# Patient Record
Sex: Female | Born: 1980 | Race: Black or African American | Hispanic: No | Marital: Single | Smoking: Never smoker
Health system: Southern US, Community
[De-identification: ages and names within clinical notes are randomized; demographics above are authoritative.]

## PROBLEM LIST (undated history)

## (undated) DIAGNOSIS — F419 Anxiety disorder, unspecified: Secondary | ICD-10-CM

## (undated) DIAGNOSIS — I10 Essential (primary) hypertension: Secondary | ICD-10-CM

## (undated) HISTORY — DX: Anxiety disorder, unspecified: F41.9

## (undated) HISTORY — DX: Essential (primary) hypertension: I10

## (undated) HISTORY — PX: TUBAL LIGATION: SHX77

## (undated) HISTORY — PX: CHOLECYSTECTOMY: SHX55

---

## 2018-02-25 DIAGNOSIS — M79652 Pain in left thigh: Secondary | ICD-10-CM | POA: Diagnosis not present

## 2018-02-25 DIAGNOSIS — M47816 Spondylosis without myelopathy or radiculopathy, lumbar region: Secondary | ICD-10-CM | POA: Diagnosis not present

## 2018-02-25 DIAGNOSIS — Z79899 Other long term (current) drug therapy: Secondary | ICD-10-CM | POA: Diagnosis not present

## 2018-02-25 DIAGNOSIS — M545 Low back pain: Secondary | ICD-10-CM | POA: Diagnosis not present

## 2018-02-25 DIAGNOSIS — M25552 Pain in left hip: Secondary | ICD-10-CM | POA: Diagnosis not present

## 2018-02-25 DIAGNOSIS — G5712 Meralgia paresthetica, left lower limb: Secondary | ICD-10-CM | POA: Diagnosis not present

## 2018-02-27 DIAGNOSIS — M545 Low back pain: Secondary | ICD-10-CM | POA: Diagnosis not present

## 2018-02-27 DIAGNOSIS — D1724 Benign lipomatous neoplasm of skin and subcutaneous tissue of left leg: Secondary | ICD-10-CM | POA: Diagnosis not present

## 2018-04-16 DIAGNOSIS — N898 Other specified noninflammatory disorders of vagina: Secondary | ICD-10-CM | POA: Diagnosis not present

## 2018-04-16 DIAGNOSIS — R350 Frequency of micturition: Secondary | ICD-10-CM | POA: Diagnosis not present

## 2018-04-16 DIAGNOSIS — R319 Hematuria, unspecified: Secondary | ICD-10-CM | POA: Diagnosis not present

## 2018-04-16 DIAGNOSIS — Z6834 Body mass index (BMI) 34.0-34.9, adult: Secondary | ICD-10-CM | POA: Diagnosis not present

## 2018-04-16 DIAGNOSIS — N926 Irregular menstruation, unspecified: Secondary | ICD-10-CM | POA: Diagnosis not present

## 2018-04-16 DIAGNOSIS — Z113 Encounter for screening for infections with a predominantly sexual mode of transmission: Secondary | ICD-10-CM | POA: Diagnosis not present

## 2018-04-21 DIAGNOSIS — N926 Irregular menstruation, unspecified: Secondary | ICD-10-CM | POA: Diagnosis not present

## 2018-05-07 DIAGNOSIS — N62 Hypertrophy of breast: Secondary | ICD-10-CM | POA: Diagnosis not present

## 2018-05-07 DIAGNOSIS — R319 Hematuria, unspecified: Secondary | ICD-10-CM | POA: Diagnosis not present

## 2018-05-07 DIAGNOSIS — Z6834 Body mass index (BMI) 34.0-34.9, adult: Secondary | ICD-10-CM | POA: Diagnosis not present

## 2018-05-07 DIAGNOSIS — R350 Frequency of micturition: Secondary | ICD-10-CM | POA: Diagnosis not present

## 2018-05-07 DIAGNOSIS — N926 Irregular menstruation, unspecified: Secondary | ICD-10-CM | POA: Diagnosis not present

## 2018-05-07 DIAGNOSIS — N3281 Overactive bladder: Secondary | ICD-10-CM | POA: Diagnosis not present

## 2018-05-08 DIAGNOSIS — M549 Dorsalgia, unspecified: Secondary | ICD-10-CM | POA: Diagnosis not present

## 2018-05-08 DIAGNOSIS — F329 Major depressive disorder, single episode, unspecified: Secondary | ICD-10-CM | POA: Diagnosis not present

## 2018-05-08 DIAGNOSIS — M6282 Rhabdomyolysis: Secondary | ICD-10-CM | POA: Diagnosis not present

## 2018-05-08 DIAGNOSIS — R109 Unspecified abdominal pain: Secondary | ICD-10-CM | POA: Diagnosis not present

## 2018-06-17 DIAGNOSIS — N926 Irregular menstruation, unspecified: Secondary | ICD-10-CM | POA: Diagnosis not present

## 2018-06-17 DIAGNOSIS — B9689 Other specified bacterial agents as the cause of diseases classified elsewhere: Secondary | ICD-10-CM | POA: Diagnosis not present

## 2018-06-17 DIAGNOSIS — N760 Acute vaginitis: Secondary | ICD-10-CM | POA: Diagnosis not present

## 2018-06-17 DIAGNOSIS — Z6833 Body mass index (BMI) 33.0-33.9, adult: Secondary | ICD-10-CM | POA: Diagnosis not present

## 2018-06-17 DIAGNOSIS — N3281 Overactive bladder: Secondary | ICD-10-CM | POA: Diagnosis not present

## 2018-11-10 DIAGNOSIS — R102 Pelvic and perineal pain: Secondary | ICD-10-CM | POA: Diagnosis not present

## 2018-11-10 DIAGNOSIS — Z6833 Body mass index (BMI) 33.0-33.9, adult: Secondary | ICD-10-CM | POA: Diagnosis not present

## 2018-11-10 DIAGNOSIS — R309 Painful micturition, unspecified: Secondary | ICD-10-CM | POA: Diagnosis not present

## 2018-11-14 DIAGNOSIS — D259 Leiomyoma of uterus, unspecified: Secondary | ICD-10-CM | POA: Diagnosis not present

## 2018-12-02 DIAGNOSIS — Z6834 Body mass index (BMI) 34.0-34.9, adult: Secondary | ICD-10-CM | POA: Diagnosis not present

## 2018-12-02 DIAGNOSIS — R102 Pelvic and perineal pain: Secondary | ICD-10-CM | POA: Diagnosis not present

## 2019-02-09 DIAGNOSIS — R895 Abnormal microbiological findings in specimens from other organs, systems and tissues: Secondary | ICD-10-CM | POA: Diagnosis not present

## 2019-02-09 DIAGNOSIS — N9489 Other specified conditions associated with female genital organs and menstrual cycle: Secondary | ICD-10-CM | POA: Diagnosis not present

## 2019-02-09 DIAGNOSIS — Z6834 Body mass index (BMI) 34.0-34.9, adult: Secondary | ICD-10-CM | POA: Diagnosis not present

## 2019-03-11 DIAGNOSIS — Z0000 Encounter for general adult medical examination without abnormal findings: Secondary | ICD-10-CM | POA: Diagnosis not present

## 2019-03-11 DIAGNOSIS — Z1151 Encounter for screening for human papillomavirus (HPV): Secondary | ICD-10-CM | POA: Diagnosis not present

## 2019-03-11 DIAGNOSIS — Z113 Encounter for screening for infections with a predominantly sexual mode of transmission: Secondary | ICD-10-CM | POA: Diagnosis not present

## 2019-03-16 DIAGNOSIS — M549 Dorsalgia, unspecified: Secondary | ICD-10-CM | POA: Diagnosis not present

## 2019-03-16 DIAGNOSIS — M47897 Other spondylosis, lumbosacral region: Secondary | ICD-10-CM | POA: Diagnosis not present

## 2019-03-16 DIAGNOSIS — M545 Low back pain: Secondary | ICD-10-CM | POA: Diagnosis not present

## 2019-05-04 DIAGNOSIS — M489 Spondylopathy, unspecified: Secondary | ICD-10-CM | POA: Diagnosis not present

## 2019-05-04 DIAGNOSIS — M5416 Radiculopathy, lumbar region: Secondary | ICD-10-CM | POA: Diagnosis not present

## 2019-05-04 DIAGNOSIS — Z6835 Body mass index (BMI) 35.0-35.9, adult: Secondary | ICD-10-CM | POA: Diagnosis not present

## 2019-05-04 DIAGNOSIS — M549 Dorsalgia, unspecified: Secondary | ICD-10-CM | POA: Diagnosis not present

## 2019-05-14 DIAGNOSIS — Z1389 Encounter for screening for other disorder: Secondary | ICD-10-CM | POA: Diagnosis not present

## 2019-05-14 DIAGNOSIS — G894 Chronic pain syndrome: Secondary | ICD-10-CM | POA: Diagnosis not present

## 2019-05-14 DIAGNOSIS — M545 Low back pain: Secondary | ICD-10-CM | POA: Diagnosis not present

## 2019-05-20 DIAGNOSIS — M519 Unspecified thoracic, thoracolumbar and lumbosacral intervertebral disc disorder: Secondary | ICD-10-CM | POA: Diagnosis not present

## 2019-05-20 DIAGNOSIS — M545 Low back pain: Secondary | ICD-10-CM | POA: Diagnosis not present

## 2019-06-18 DIAGNOSIS — G894 Chronic pain syndrome: Secondary | ICD-10-CM | POA: Diagnosis not present

## 2019-06-18 DIAGNOSIS — M545 Low back pain: Secondary | ICD-10-CM | POA: Diagnosis not present

## 2019-07-22 DIAGNOSIS — M545 Low back pain: Secondary | ICD-10-CM | POA: Diagnosis not present

## 2019-07-22 DIAGNOSIS — G894 Chronic pain syndrome: Secondary | ICD-10-CM | POA: Diagnosis not present

## 2019-07-23 DIAGNOSIS — M489 Spondylopathy, unspecified: Secondary | ICD-10-CM | POA: Diagnosis not present

## 2019-07-23 DIAGNOSIS — F329 Major depressive disorder, single episode, unspecified: Secondary | ICD-10-CM | POA: Diagnosis not present

## 2019-07-23 DIAGNOSIS — N6489 Other specified disorders of breast: Secondary | ICD-10-CM | POA: Diagnosis not present

## 2019-07-23 DIAGNOSIS — J309 Allergic rhinitis, unspecified: Secondary | ICD-10-CM | POA: Diagnosis not present

## 2019-11-10 DIAGNOSIS — M5489 Other dorsalgia: Secondary | ICD-10-CM | POA: Diagnosis not present

## 2019-11-10 DIAGNOSIS — N62 Hypertrophy of breast: Secondary | ICD-10-CM | POA: Diagnosis not present

## 2020-01-02 DIAGNOSIS — R109 Unspecified abdominal pain: Secondary | ICD-10-CM | POA: Diagnosis not present

## 2020-01-02 DIAGNOSIS — M5442 Lumbago with sciatica, left side: Secondary | ICD-10-CM | POA: Diagnosis not present

## 2020-01-02 DIAGNOSIS — R319 Hematuria, unspecified: Secondary | ICD-10-CM | POA: Diagnosis not present

## 2020-01-02 DIAGNOSIS — G8929 Other chronic pain: Secondary | ICD-10-CM | POA: Diagnosis not present

## 2020-03-23 DIAGNOSIS — Z202 Contact with and (suspected) exposure to infections with a predominantly sexual mode of transmission: Secondary | ICD-10-CM | POA: Diagnosis not present

## 2020-03-23 DIAGNOSIS — Z6834 Body mass index (BMI) 34.0-34.9, adult: Secondary | ICD-10-CM | POA: Diagnosis not present

## 2020-03-23 DIAGNOSIS — Z0000 Encounter for general adult medical examination without abnormal findings: Secondary | ICD-10-CM | POA: Diagnosis not present

## 2020-03-23 DIAGNOSIS — Z1151 Encounter for screening for human papillomavirus (HPV): Secondary | ICD-10-CM | POA: Diagnosis not present

## 2020-03-29 DIAGNOSIS — Z1231 Encounter for screening mammogram for malignant neoplasm of breast: Secondary | ICD-10-CM | POA: Diagnosis not present

## 2020-04-05 DIAGNOSIS — Z803 Family history of malignant neoplasm of breast: Secondary | ICD-10-CM | POA: Diagnosis not present

## 2020-04-05 DIAGNOSIS — N6321 Unspecified lump in the left breast, upper outer quadrant: Secondary | ICD-10-CM | POA: Diagnosis not present

## 2020-04-27 DIAGNOSIS — R928 Other abnormal and inconclusive findings on diagnostic imaging of breast: Secondary | ICD-10-CM | POA: Diagnosis not present

## 2020-04-28 DIAGNOSIS — R928 Other abnormal and inconclusive findings on diagnostic imaging of breast: Secondary | ICD-10-CM | POA: Diagnosis not present

## 2020-05-02 DIAGNOSIS — Z6836 Body mass index (BMI) 36.0-36.9, adult: Secondary | ICD-10-CM | POA: Diagnosis not present

## 2020-05-02 DIAGNOSIS — N941 Unspecified dyspareunia: Secondary | ICD-10-CM | POA: Diagnosis not present

## 2020-05-23 DIAGNOSIS — M7918 Myalgia, other site: Secondary | ICD-10-CM | POA: Diagnosis not present

## 2020-05-23 DIAGNOSIS — Z5181 Encounter for therapeutic drug level monitoring: Secondary | ICD-10-CM | POA: Diagnosis not present

## 2020-05-23 DIAGNOSIS — M461 Sacroiliitis, not elsewhere classified: Secondary | ICD-10-CM | POA: Diagnosis not present

## 2020-05-23 DIAGNOSIS — G8929 Other chronic pain: Secondary | ICD-10-CM | POA: Diagnosis not present

## 2020-05-23 DIAGNOSIS — M47816 Spondylosis without myelopathy or radiculopathy, lumbar region: Secondary | ICD-10-CM | POA: Diagnosis not present

## 2020-05-23 DIAGNOSIS — Z79891 Long term (current) use of opiate analgesic: Secondary | ICD-10-CM | POA: Diagnosis not present

## 2020-06-08 DIAGNOSIS — M47816 Spondylosis without myelopathy or radiculopathy, lumbar region: Secondary | ICD-10-CM | POA: Diagnosis not present

## 2020-06-08 DIAGNOSIS — M7918 Myalgia, other site: Secondary | ICD-10-CM | POA: Diagnosis not present

## 2020-06-08 DIAGNOSIS — G8929 Other chronic pain: Secondary | ICD-10-CM | POA: Diagnosis not present

## 2020-06-08 DIAGNOSIS — M461 Sacroiliitis, not elsewhere classified: Secondary | ICD-10-CM | POA: Diagnosis not present

## 2020-06-10 DIAGNOSIS — N200 Calculus of kidney: Secondary | ICD-10-CM | POA: Diagnosis not present

## 2020-06-10 DIAGNOSIS — Z6835 Body mass index (BMI) 35.0-35.9, adult: Secondary | ICD-10-CM | POA: Diagnosis not present

## 2020-06-10 DIAGNOSIS — E669 Obesity, unspecified: Secondary | ICD-10-CM | POA: Diagnosis not present

## 2020-06-10 DIAGNOSIS — R109 Unspecified abdominal pain: Secondary | ICD-10-CM | POA: Diagnosis not present

## 2020-06-16 DIAGNOSIS — Z20822 Contact with and (suspected) exposure to covid-19: Secondary | ICD-10-CM | POA: Diagnosis not present

## 2020-06-29 DIAGNOSIS — M47816 Spondylosis without myelopathy or radiculopathy, lumbar region: Secondary | ICD-10-CM | POA: Diagnosis not present

## 2020-06-29 DIAGNOSIS — G8929 Other chronic pain: Secondary | ICD-10-CM | POA: Diagnosis not present

## 2020-06-29 DIAGNOSIS — M461 Sacroiliitis, not elsewhere classified: Secondary | ICD-10-CM | POA: Diagnosis not present

## 2020-07-07 DIAGNOSIS — M461 Sacroiliitis, not elsewhere classified: Secondary | ICD-10-CM | POA: Diagnosis not present

## 2020-07-18 DIAGNOSIS — G8929 Other chronic pain: Secondary | ICD-10-CM | POA: Diagnosis not present

## 2020-07-18 DIAGNOSIS — M7918 Myalgia, other site: Secondary | ICD-10-CM | POA: Diagnosis not present

## 2020-07-18 DIAGNOSIS — M5416 Radiculopathy, lumbar region: Secondary | ICD-10-CM | POA: Diagnosis not present

## 2020-08-11 DIAGNOSIS — Z5181 Encounter for therapeutic drug level monitoring: Secondary | ICD-10-CM | POA: Diagnosis not present

## 2020-08-11 DIAGNOSIS — M7918 Myalgia, other site: Secondary | ICD-10-CM | POA: Diagnosis not present

## 2020-08-11 DIAGNOSIS — G8929 Other chronic pain: Secondary | ICD-10-CM | POA: Diagnosis not present

## 2020-08-11 DIAGNOSIS — M25561 Pain in right knee: Secondary | ICD-10-CM | POA: Diagnosis not present

## 2020-08-11 DIAGNOSIS — M47816 Spondylosis without myelopathy or radiculopathy, lumbar region: Secondary | ICD-10-CM | POA: Diagnosis not present

## 2020-08-11 DIAGNOSIS — Z79891 Long term (current) use of opiate analgesic: Secondary | ICD-10-CM | POA: Diagnosis not present

## 2020-08-11 DIAGNOSIS — M25562 Pain in left knee: Secondary | ICD-10-CM | POA: Diagnosis not present

## 2020-08-13 DIAGNOSIS — N938 Other specified abnormal uterine and vaginal bleeding: Secondary | ICD-10-CM | POA: Diagnosis not present

## 2020-08-13 DIAGNOSIS — R102 Pelvic and perineal pain: Secondary | ICD-10-CM | POA: Diagnosis not present

## 2020-08-13 DIAGNOSIS — N939 Abnormal uterine and vaginal bleeding, unspecified: Secondary | ICD-10-CM | POA: Diagnosis not present

## 2020-08-15 DIAGNOSIS — M170 Bilateral primary osteoarthritis of knee: Secondary | ICD-10-CM | POA: Diagnosis not present

## 2020-08-16 DIAGNOSIS — R319 Hematuria, unspecified: Secondary | ICD-10-CM | POA: Diagnosis not present

## 2020-08-16 DIAGNOSIS — Z6836 Body mass index (BMI) 36.0-36.9, adult: Secondary | ICD-10-CM | POA: Diagnosis not present

## 2020-08-16 DIAGNOSIS — N926 Irregular menstruation, unspecified: Secondary | ICD-10-CM | POA: Diagnosis not present

## 2020-08-16 DIAGNOSIS — R350 Frequency of micturition: Secondary | ICD-10-CM | POA: Diagnosis not present

## 2020-08-16 DIAGNOSIS — N898 Other specified noninflammatory disorders of vagina: Secondary | ICD-10-CM | POA: Diagnosis not present

## 2020-08-16 DIAGNOSIS — Z202 Contact with and (suspected) exposure to infections with a predominantly sexual mode of transmission: Secondary | ICD-10-CM | POA: Diagnosis not present

## 2020-08-31 DIAGNOSIS — Z6835 Body mass index (BMI) 35.0-35.9, adult: Secondary | ICD-10-CM | POA: Diagnosis not present

## 2020-08-31 DIAGNOSIS — N920 Excessive and frequent menstruation with regular cycle: Secondary | ICD-10-CM | POA: Diagnosis not present

## 2020-09-01 DIAGNOSIS — G8929 Other chronic pain: Secondary | ICD-10-CM | POA: Diagnosis not present

## 2020-09-01 DIAGNOSIS — M47816 Spondylosis without myelopathy or radiculopathy, lumbar region: Secondary | ICD-10-CM | POA: Diagnosis not present

## 2020-09-01 DIAGNOSIS — M170 Bilateral primary osteoarthritis of knee: Secondary | ICD-10-CM | POA: Diagnosis not present

## 2020-09-01 DIAGNOSIS — M791 Myalgia, unspecified site: Secondary | ICD-10-CM | POA: Diagnosis not present

## 2020-09-19 DIAGNOSIS — Z3202 Encounter for pregnancy test, result negative: Secondary | ICD-10-CM | POA: Diagnosis not present

## 2020-09-19 DIAGNOSIS — Z6835 Body mass index (BMI) 35.0-35.9, adult: Secondary | ICD-10-CM | POA: Diagnosis not present

## 2020-09-19 DIAGNOSIS — N920 Excessive and frequent menstruation with regular cycle: Secondary | ICD-10-CM | POA: Diagnosis not present

## 2020-09-21 DIAGNOSIS — M47816 Spondylosis without myelopathy or radiculopathy, lumbar region: Secondary | ICD-10-CM | POA: Diagnosis not present

## 2020-09-21 DIAGNOSIS — M170 Bilateral primary osteoarthritis of knee: Secondary | ICD-10-CM | POA: Diagnosis not present

## 2020-09-21 DIAGNOSIS — M542 Cervicalgia: Secondary | ICD-10-CM | POA: Diagnosis not present

## 2020-09-21 DIAGNOSIS — Z79891 Long term (current) use of opiate analgesic: Secondary | ICD-10-CM | POA: Diagnosis not present

## 2020-10-29 DIAGNOSIS — H9202 Otalgia, left ear: Secondary | ICD-10-CM | POA: Diagnosis not present

## 2020-10-29 DIAGNOSIS — Z9049 Acquired absence of other specified parts of digestive tract: Secondary | ICD-10-CM | POA: Diagnosis not present

## 2020-10-29 DIAGNOSIS — J069 Acute upper respiratory infection, unspecified: Secondary | ICD-10-CM | POA: Diagnosis not present

## 2020-10-29 DIAGNOSIS — K0889 Other specified disorders of teeth and supporting structures: Secondary | ICD-10-CM | POA: Diagnosis not present

## 2020-11-03 DIAGNOSIS — R928 Other abnormal and inconclusive findings on diagnostic imaging of breast: Secondary | ICD-10-CM | POA: Diagnosis not present

## 2020-12-09 ENCOUNTER — Other Ambulatory Visit: Payer: Self-pay

## 2020-12-09 DIAGNOSIS — Z5321 Procedure and treatment not carried out due to patient leaving prior to being seen by health care provider: Secondary | ICD-10-CM | POA: Insufficient documentation

## 2020-12-09 DIAGNOSIS — R109 Unspecified abdominal pain: Secondary | ICD-10-CM | POA: Insufficient documentation

## 2020-12-09 DIAGNOSIS — R110 Nausea: Secondary | ICD-10-CM | POA: Diagnosis not present

## 2020-12-09 DIAGNOSIS — M545 Low back pain, unspecified: Secondary | ICD-10-CM | POA: Diagnosis not present

## 2020-12-09 DIAGNOSIS — Z9049 Acquired absence of other specified parts of digestive tract: Secondary | ICD-10-CM | POA: Diagnosis not present

## 2020-12-09 DIAGNOSIS — R350 Frequency of micturition: Secondary | ICD-10-CM | POA: Insufficient documentation

## 2020-12-09 DIAGNOSIS — M5459 Other low back pain: Secondary | ICD-10-CM | POA: Diagnosis not present

## 2020-12-09 LAB — URINALYSIS, ROUTINE W REFLEX MICROSCOPIC
Bacteria, UA: NONE SEEN
Bilirubin Urine: NEGATIVE
Glucose, UA: NEGATIVE mg/dL
Ketones, ur: 5 mg/dL — AB
Leukocytes,Ua: NEGATIVE
Nitrite: NEGATIVE
Protein, ur: 30 mg/dL — AB
Specific Gravity, Urine: 1.031 — ABNORMAL HIGH (ref 1.005–1.030)
pH: 5 (ref 5.0–8.0)

## 2020-12-09 LAB — CBC
HCT: 37.2 % (ref 36.0–46.0)
Hemoglobin: 13.2 g/dL (ref 12.0–15.0)
MCH: 32.9 pg (ref 26.0–34.0)
MCHC: 35.5 g/dL (ref 30.0–36.0)
MCV: 92.8 fL (ref 80.0–100.0)
Platelets: 311 10*3/uL (ref 150–400)
RBC: 4.01 MIL/uL (ref 3.87–5.11)
RDW: 12.2 % (ref 11.5–15.5)
WBC: 8.4 10*3/uL (ref 4.0–10.5)
nRBC: 0 % (ref 0.0–0.2)

## 2020-12-09 LAB — BASIC METABOLIC PANEL
Anion gap: 7 (ref 5–15)
BUN: 12 mg/dL (ref 6–20)
CO2: 23 mmol/L (ref 22–32)
Calcium: 9.1 mg/dL (ref 8.9–10.3)
Chloride: 108 mmol/L (ref 98–111)
Creatinine, Ser: 0.71 mg/dL (ref 0.44–1.00)
GFR, Estimated: >60 mL/min (ref >60)
Glucose, Bld: 101 mg/dL — ABNORMAL HIGH (ref 70–99)
Potassium: 3.9 mmol/L (ref 3.5–5.1)
Sodium: 138 mmol/L (ref 135–145)

## 2020-12-09 NOTE — ED Triage Notes (Signed)
Pt ti ED for left sided flank pain started a few days ago with increased urinary frequency. +nausea NAD noted

## 2020-12-10 ENCOUNTER — Other Ambulatory Visit: Payer: Self-pay

## 2020-12-10 ENCOUNTER — Emergency Department: Payer: Medicaid Other

## 2020-12-10 ENCOUNTER — Emergency Department
Admission: EM | Admit: 2020-12-10 | Discharge: 2020-12-10 | Disposition: A | Discharge status: Home / Self Care | Payer: Medicaid Other | Source: Home / Self Care | Attending: Emergency Medicine | Admitting: Emergency Medicine

## 2020-12-10 ENCOUNTER — Emergency Department
Admission: EM | Admit: 2020-12-10 | Discharge: 2020-12-10 | Disposition: A | Discharge status: Home / Self Care | Payer: Medicaid Other | Attending: Emergency Medicine | Admitting: Emergency Medicine

## 2020-12-10 ENCOUNTER — Encounter: Payer: Self-pay | Admitting: Emergency Medicine

## 2020-12-10 DIAGNOSIS — R109 Unspecified abdominal pain: Secondary | ICD-10-CM | POA: Insufficient documentation

## 2020-12-10 DIAGNOSIS — M545 Low back pain, unspecified: Secondary | ICD-10-CM | POA: Insufficient documentation

## 2020-12-10 DIAGNOSIS — Z9049 Acquired absence of other specified parts of digestive tract: Secondary | ICD-10-CM | POA: Diagnosis not present

## 2020-12-10 DIAGNOSIS — M5459 Other low back pain: Secondary | ICD-10-CM | POA: Diagnosis not present

## 2020-12-10 LAB — URINALYSIS, ROUTINE W REFLEX MICROSCOPIC
Bacteria, UA: NONE SEEN
Bilirubin Urine: NEGATIVE
Glucose, UA: NEGATIVE mg/dL
Ketones, ur: NEGATIVE mg/dL
Leukocytes,Ua: NEGATIVE
Nitrite: NEGATIVE
Protein, ur: NEGATIVE mg/dL
Specific Gravity, Urine: 1.023 (ref 1.005–1.030)
pH: 5 (ref 5.0–8.0)

## 2020-12-10 MED ORDER — NAPROXEN 500 MG PO TABS: 500.0000 mg | ORAL_TABLET | Freq: Two times a day (BID) | ORAL | 2 refills | Status: DC

## 2020-12-10 NOTE — ED Triage Notes (Signed)
Pt reports was here last pm and left due to wait. Pt states pain to left lower back that radiates around left flank and to her left lower abd. Pt reports concern for kidney stone

## 2020-12-10 NOTE — ED Provider Notes (Signed)
[facility] Emergency Department Provider Note   ____________________________________________    I have reviewed the triage vital signs and the nursing notes.   HISTORY  Chief Complaint Abdominal Pain and Flank Pain     HPI Dawn Hunter is a 40 y.o. female who presents with complaints of left lower back pain and some flank pain which she reports started 2 days ago.  She reports it is more painful with movement.  She is concerned that she may have a kidney stone, no history of kidney stones, denies hematuria.  No fevers chills nausea or vomiting.  Does not take anything for this.  No injury to the back reported.  No loss of bowel or bladder continence, no numbness or weakness  History reviewed. No pertinent past medical history.  There are no problems to display for this patient.   History reviewed. No pertinent surgical history.  Prior to Admission medications   Medication Sig Start Date End Date Taking? Authorizing Provider  naproxen (NAPROSYN) 500 MG tablet Take 1 tablet (500 mg total) by mouth 2 (two) times daily with a meal. 12/10/20  Yes Lavonia Drafts, MD     Allergies Patient has no allergy information on record.  No family history on file.  Social History Social History   Substance Use Topics   Alcohol use: Not Currently   Drug use: Not Currently    Review of Systems  Constitutional: No fever/chills Eyes: No visual changes.  ENT: No sore throat. Cardiovascular: Denies chest pain. Respiratory: Denies shortness of breath. Gastrointestinal: As above Genitourinary: Negative for dysuria. Musculoskeletal: As above Skin: Negative for rash. Neurological: Negative for headaches or weakness   ____________________________________________   PHYSICAL EXAM:  VITAL SIGNS: ED Triage Vitals  Enc Vitals Group     BP 12/10/20 1306 127/82     Pulse Rate 12/10/20 1306 68     Resp 12/10/20 1306 18     Temp 12/10/20 1306 98 F  (36.7 C)     Temp Source 12/10/20 1306 Oral     SpO2 12/10/20 1306 97 %     Weight 12/10/20 1145 102.5 kg (226 lb)     Height 12/10/20 1145 1.702 m (5\' 7" )     Head Circumference --      Peak Flow --      Pain Score 12/10/20 1145 7     Pain Loc --      Pain Edu? --      Excl. in [place]? --     Constitutional: Alert and oriented.  Eyes: Conjunctivae are normal.   Nose: No congestion/rhinnorhea. Mouth/Throat: Mucous membranes are moist.    Cardiovascular: Normal rate, regular rhythm. Grossly normal heart sounds.  Good peripheral circulation. Respiratory: Normal respiratory effort.  No retractions. Lungs CTAB. Gastrointestinal: Soft and nontender. No distention.  No CVA tenderness Musculoskeletal: Mild left lumbar paraspinal tenderness to palpation, normal strength in the lower extremities, no saddle anesthesia Neurologic:  Normal speech and language. No gross focal neurologic deficits are appreciated.  Skin:  Skin is warm, dry and intact. No rash noted. Psychiatric: Mood and affect are normal. Speech and behavior are normal.  ____________________________________________   LABS (all labs ordered are listed, but only abnormal results are displayed)  Labs Reviewed  URINALYSIS, ROUTINE W REFLEX MICROSCOPIC - Abnormal; Notable for the following components:      Result Value   Color, Urine YELLOW (*)    APPearance HAZY (*)    Hgb urine dipstick SMALL (*)  All other components within normal limits   ____________________________________________  EKG  ____________________________________________  RADIOLOGY  CT renal stone study reviewed by me, no acute abnormality ____________________________________________   PROCEDURES  Procedure(s) performed: No  Procedures   Critical Care performed: No ____________________________________________   INITIAL IMPRESSION / ASSESSMENT AND PLAN / ED COURSE  Pertinent labs & imaging results that were available during my care of the  patient were reviewed by me and considered in my medical decision making (see chart for details).   Patient presents with left low back pain/flank pain as described above, overall I suspect musculoskeletal pain based on her exam however possibility of ureterolithiasis, will obtain urinalysis and CT renal stone study  CT scan is negative for ureterolithiasis or diverticulitis Urinalysis is reassuring, appropriate for discharge with NSAIDs and outpatient follow-up    ____________________________________________   FINAL CLINICAL IMPRESSION(S) / ED DIAGNOSES  Final diagnoses:  Acute low back pain without sciatica, unspecified back pain laterality        Note:  This document was prepared using Dragon voice recognition software and may include unintentional dictation errors.    Lavonia Drafts, MD 12/10/20 1525

## 2021-01-25 ENCOUNTER — Ambulatory Visit
Admission: EM | Admit: 2021-01-25 | Discharge: 2021-01-25 | Disposition: A | Discharge status: Other Acute Inpatient Hospital | Payer: Medicaid Other | Attending: Physician Assistant | Admitting: Physician Assistant

## 2021-01-25 ENCOUNTER — Other Ambulatory Visit: Payer: Self-pay

## 2021-01-25 ENCOUNTER — Emergency Department: Payer: Medicaid Other

## 2021-01-25 ENCOUNTER — Emergency Department
Admission: EM | Admit: 2021-01-25 | Discharge: 2021-01-25 | Disposition: A | Discharge status: Home / Self Care | Payer: Medicaid Other | Attending: Emergency Medicine | Admitting: Emergency Medicine

## 2021-01-25 ENCOUNTER — Encounter: Payer: Self-pay | Admitting: Emergency Medicine

## 2021-01-25 DIAGNOSIS — M542 Cervicalgia: Secondary | ICD-10-CM

## 2021-01-25 DIAGNOSIS — Z20822 Contact with and (suspected) exposure to covid-19: Secondary | ICD-10-CM | POA: Diagnosis not present

## 2021-01-25 DIAGNOSIS — R110 Nausea: Secondary | ICD-10-CM | POA: Diagnosis not present

## 2021-01-25 DIAGNOSIS — M79602 Pain in left arm: Secondary | ICD-10-CM

## 2021-01-25 DIAGNOSIS — R202 Paresthesia of skin: Secondary | ICD-10-CM | POA: Diagnosis not present

## 2021-01-25 DIAGNOSIS — R079 Chest pain, unspecified: Secondary | ICD-10-CM

## 2021-01-25 DIAGNOSIS — R0789 Other chest pain: Secondary | ICD-10-CM | POA: Insufficient documentation

## 2021-01-25 DIAGNOSIS — H9202 Otalgia, left ear: Secondary | ICD-10-CM | POA: Diagnosis not present

## 2021-01-25 DIAGNOSIS — R519 Headache, unspecified: Secondary | ICD-10-CM | POA: Diagnosis not present

## 2021-01-25 DIAGNOSIS — J3489 Other specified disorders of nose and nasal sinuses: Secondary | ICD-10-CM | POA: Diagnosis not present

## 2021-01-25 DIAGNOSIS — R42 Dizziness and giddiness: Secondary | ICD-10-CM | POA: Diagnosis not present

## 2021-01-25 LAB — CBC
HCT: 37.7 % (ref 36.0–46.0)
Hemoglobin: 13.2 g/dL (ref 12.0–15.0)
MCH: 32.7 pg (ref 26.0–34.0)
MCHC: 35 g/dL (ref 30.0–36.0)
MCV: 93.3 fL (ref 80.0–100.0)
Platelets: 299 10*3/uL (ref 150–400)
RBC: 4.04 MIL/uL (ref 3.87–5.11)
RDW: 12.7 % (ref 11.5–15.5)
WBC: 6.1 10*3/uL (ref 4.0–10.5)
nRBC: 0 % (ref 0.0–0.2)

## 2021-01-25 LAB — BASIC METABOLIC PANEL
Anion gap: 6 (ref 5–15)
BUN: 11 mg/dL (ref 6–20)
CO2: 23 mmol/L (ref 22–32)
Calcium: 8.7 mg/dL — ABNORMAL LOW (ref 8.9–10.3)
Chloride: 106 mmol/L (ref 98–111)
Creatinine, Ser: 0.7 mg/dL (ref 0.44–1.00)
GFR, Estimated: >60 mL/min (ref >60)
Glucose, Bld: 108 mg/dL — ABNORMAL HIGH (ref 70–99)
Potassium: 3.8 mmol/L (ref 3.5–5.1)
Sodium: 135 mmol/L (ref 135–145)

## 2021-01-25 LAB — RESP PANEL BY RT-PCR (FLU A&B, COVID) ARPGX2
Influenza A by PCR: NEGATIVE
Influenza B by PCR: NEGATIVE
SARS Coronavirus 2 by RT PCR: NEGATIVE

## 2021-01-25 LAB — TROPONIN I (HIGH SENSITIVITY)
Troponin I (High Sensitivity): 3 ng/L (ref <18)
Troponin I (High Sensitivity): 3 ng/L (ref <18)

## 2021-01-25 LAB — POC URINE PREG, ED: Preg Test, Ur: NEGATIVE

## 2021-01-25 MED ORDER — LORAZEPAM 2 MG/ML IJ SOLN
1.0000 mg | Freq: Once | INTRAMUSCULAR | Status: AC
  Administered 2021-01-25: 14:00:00 1 mg via INTRAVENOUS
  Filled 2021-01-25: qty 1

## 2021-01-25 MED ORDER — MAGNESIUM SULFATE 2 GM/50ML IV SOLN
2.0000 g | Freq: Once | INTRAVENOUS | Status: AC
  Administered 2021-01-25: 13:00:00 2 g via INTRAVENOUS
  Filled 2021-01-25: qty 50

## 2021-01-25 MED ORDER — ONDANSETRON 4 MG PO TBDP
4.0000 mg | ORAL_TABLET | Freq: Once | ORAL | Status: AC
  Administered 2021-01-25: 09:00:00 4 mg via ORAL

## 2021-01-25 MED ORDER — ASPIRIN 81 MG PO CHEW
324.0000 mg | CHEWABLE_TABLET | Freq: Once | ORAL | Status: AC
  Administered 2021-01-25: 09:00:00 324 mg via ORAL

## 2021-01-25 MED ORDER — DIPHENHYDRAMINE HCL 50 MG/ML IJ SOLN
25.0000 mg | Freq: Once | INTRAMUSCULAR | Status: AC
  Administered 2021-01-25: 13:00:00 25 mg via INTRAVENOUS
  Filled 2021-01-25: qty 1

## 2021-01-25 MED ORDER — LACTATED RINGERS IV BOLUS
1000.0000 mL | Freq: Once | INTRAVENOUS | Status: AC
  Administered 2021-01-25: 13:00:00 1000 mL via INTRAVENOUS

## 2021-01-25 MED ORDER — PROCHLORPERAZINE EDISYLATE 10 MG/2ML IJ SOLN
10.0000 mg | Freq: Once | INTRAMUSCULAR | Status: AC
  Administered 2021-01-25: 13:00:00 10 mg via INTRAVENOUS
  Filled 2021-01-25: qty 2

## 2021-01-25 NOTE — Discharge Instructions (Addendum)

## 2021-01-25 NOTE — ED Triage Notes (Signed)
Presents via EMS from [place] with h/a for the past 5 days   then noticed some left sided ches tpressure 1 day ago

## 2021-01-25 NOTE — ED Triage Notes (Signed)
Pt reports HA x 5 days, pressure and tingling in occipital region.  Reports L sided chest pressure and L arm pain x 1 day, dizziness starting this morning. No nausea.

## 2021-01-25 NOTE — ED Provider Notes (Signed)
[facility] Emergency Department Provider Note  ____________________________________________   Event Date/Time   First MD Initiated Contact with Patient 01/25/21 1247     (approximate)  I have reviewed the triage vital signs and the nursing notes.   HISTORY  Chief Complaint Chest Pain and Headache   HPI Dawn Hunter is a 40 y.o. female with below noted past medical history who presents for assessment of 5 days of headache associate with some left-sided chest pressure rating down the left arm that started yesterday.  Patient also states she developed some left ear pressure yesterday.  No right-sided ear pain, vision changes, vertigo, fevers, cough, shortness of breath, abdominal pain, back pain, rash or extremity pain.  No vomiting recent traumatic injuries or falls.  Patient denies EtOH use or illicit drug use.  She was seen in urgent care earlier today and referred to the emergency room with concerns for possible ACS.         History reviewed. No pertinent past medical history.  There are no problems to display for this patient.   Past Surgical History:  Procedure Laterality Date   CESAREAN SECTION     TUBAL LIGATION      Prior to Admission medications   Medication Sig Start Date End Date Taking? Authorizing Provider  naproxen (NAPROSYN) 500 MG tablet Take 1 tablet (500 mg total) by mouth 2 (two) times daily with a meal. Patient not taking: Reported on 01/25/2021 12/10/20   Lavonia Drafts, MD  pregabalin (LYRICA) 150 MG capsule Take 150 mg by mouth 2 (two) times daily. Patient not taking: Reported on 01/25/2021 09/01/20   [provider]    Allergies Patient has no known allergies.  No family history on file.  Social History Social History   Tobacco Use   Smoking status: Never   Smokeless tobacco: Never  Vaping Use   Vaping Use: Never used  Substance Use Topics   Alcohol use: Not Currently   Drug use: Not Currently     Review of Systems  Review of Systems  Constitutional:  Negative for chills and fever.  HENT:  Negative for sore throat.   Eyes:  Negative for pain.  Respiratory:  Negative for cough and stridor.   Cardiovascular:  Positive for chest pain.  Gastrointestinal:  Negative for vomiting.  Genitourinary:  Negative for dysuria.  Skin:  Negative for rash.  Neurological:  Positive for headaches. Negative for seizures and loss of consciousness.  Psychiatric/Behavioral:  Negative for suicidal ideas.   All other systems reviewed and are negative.    ____________________________________________   PHYSICAL EXAM:  VITAL SIGNS: ED Triage Vitals  Enc Vitals Group     BP 01/25/21 1024 127/83     Pulse Rate 01/25/21 1024 78     Resp 01/25/21 1024 18     Temp 01/25/21 1024 98 F (36.7 C)     Temp Source 01/25/21 1024 Oral     SpO2 01/25/21 1024 96 %     Weight 01/25/21 1019 225 lb 15.5 oz (102.5 kg)     Height 01/25/21 1019 5\' 7"  (1.702 m)     Head Circumference --      Peak Flow --      Pain Score 01/25/21 1019 6     Pain Loc --      Pain Edu? --      Excl. in [place]? --    Vitals:   01/25/21 1107 01/25/21 1330  BP: (!) 135/91 124/66  Pulse: 75 73  Resp: 18 18  Temp: 97.8 F (36.6 C)   SpO2: 95% 100%   Physical Exam Vitals and nursing note reviewed.  Constitutional:      General: She is not in acute distress.    Appearance: She is well-developed.  HENT:     Head: Normocephalic and atraumatic.     Right Ear: Tympanic membrane and external ear normal.     Left Ear: Tympanic membrane and external ear normal.  Eyes:     Conjunctiva/sclera: Conjunctivae normal.  Cardiovascular:     Rate and Rhythm: Normal rate and regular rhythm.     Heart sounds: No murmur heard. Pulmonary:     Effort: Pulmonary effort is normal. No respiratory distress.     Breath sounds: Normal breath sounds.  Abdominal:     Palpations: Abdomen is soft.     Tenderness: There is no abdominal tenderness.   Musculoskeletal:        General: No swelling.     Cervical back: Neck supple.  Skin:    General: Skin is warm and dry.     Capillary Refill: Capillary refill takes less than 2 seconds.  Neurological:     Mental Status: She is alert.  Psychiatric:        Mood and Affect: Mood normal.    Patient has full symmetric strength throughout the bilateral upper and lower extremities.  Sensations intact to light touch of all extremities.  No pronator drift.  No finger dysmetria.  There appears to be a possibly very subtle left-sided lower facial droop although this is no longer appreciated on several reassessments.  Patient does not appear meningitic and has large motion of her neck.  No overlying skin changes of the neck.  Some mild tenderness over bilateral trapezius muscles. ____________________________________________   LABS (all labs ordered are listed, but only abnormal results are displayed)  Labs Reviewed  BASIC METABOLIC PANEL - Abnormal; Notable for the following components:      Result Value   Glucose, Bld 108 (*)    Calcium 8.7 (*)    All other components within normal limits  RESP PANEL BY RT-PCR (FLU A&B, COVID) ARPGX2  CBC  POC URINE PREG, ED  TROPONIN I (HIGH SENSITIVITY)  TROPONIN I (HIGH SENSITIVITY)   ____________________________________________  EKG  ECG remarkable for sinus rhythm with a ventricular rate of 81, normal axis, unremarkable intervals without clear evidence of acute ischemia or significant arrhythmia. ____________________________________________  RADIOLOGY  ED MD interpretation: Chest x-ray shows no focal consolidation, effusion, edema, pneumothorax or other clear acute thoracic process.  CT head shows no evidence of intracranial hemorrhage, mass-effect, abscess, or other clear acute process but there is evidence of mild right and minimally left chronic maxillary sinusitis.  MRI brain shows no evidence of any focal lesions ischemia mass-effect or  other acute process.  There is evidence of some chronic very mild sinus disease.  Official radiology report(s): DG Chest 2 View  Result Date: 01/25/2021 CLINICAL DATA:  Chest pain EXAM: CHEST - 2 VIEW COMPARISON:  None. FINDINGS: The heart size and mediastinal contours are within normal limits. Shallow inspiration with low lung volumes. Both lungs are clear. No pleural effusion or pneumothorax. The visualized skeletal structures are unremarkable. IMPRESSION: No acute process in the chest. Electronically Signed   By: Macy Mis M.D.   On: 01/25/2021 10:58   CT Head Wo Contrast  Result Date: 01/25/2021 CLINICAL DATA:  Sudden onset severe headache with features of a  classic migraine. She has had a headache for 5 days with pressure tingling sensations in the occipital region. Dizziness began this morning. EXAM: CT HEAD WITHOUT CONTRAST TECHNIQUE: Contiguous axial images were obtained from the base of the skull through the vertex without intravenous contrast. COMPARISON:  None. FINDINGS: Brain: Normal appearing cerebral hemispheres and posterior fossa structures. Normal size and position of the ventricles. No intracranial hemorrhage, mass lesion or CT evidence of acute infarction. Vascular: No hyperdense vessel or unexpected calcification. Skull: Normal. Negative for fracture or focal lesion. Sinuses/Orbits: Mild right maxillary sinus mucosal thickening and minimal left maxillary sinus mucosal thickening. Unremarkable orbits. Other: None. IMPRESSION: 1. No intracranial abnormality. 2. 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 Electronically Signed   By: Claudie Revering M.D.   On: 01/25/2021 13:01   MR BRAIN WO CONTRAST  Result Date: 01/25/2021 CLINICAL DATA:  Neuro deficit, acute, stroke suspected. Headache for 5 days. New onset of left-sided chest pressure. EXAM: MRI HEAD WITHOUT CONTRAST TECHNIQUE: Multiplanar, multiecho pulse sequences of the brain and surrounding structures were  obtained without intravenous contrast. COMPARISON:  CT head without contrast 01/25/2021 FINDINGS: Brain: No acute infarct, hemorrhage, or mass lesion is present. No significant white matter lesions are present. The ventricles are of normal size. No significant extraaxial fluid collection is present. The internal auditory canals are within normal limits. The brainstem and cerebellum are within normal limits. Vascular: Flow is present in the major intracranial arteries. Skull and upper cervical spine: The craniocervical junction is normal. Upper cervical spine is within normal limits. Marrow signal is unremarkable. Sinuses/Orbits: Mild mucosal thickening is present in the maxillary sinuses bilaterally and scattered throughout the ethmoid air cells. No fluid levels are present. The paranasal sinuses and mastoid air cells are otherwise clear. The globes and orbits are within normal limits. IMPRESSION: 1. Normal MRI appearance of the brain. No acute or focal lesion to explain the patient's symptoms. 2. Mild diffuse sinus disease. Electronically Signed   By: San Morelle M.D.   On: 01/25/2021 14:51    ____________________________________________   PROCEDURES  Procedure(s) performed (including Critical Care):  Procedures   ____________________________________________   INITIAL IMPRESSION / ASSESSMENT AND PLAN / ED COURSE      Patient presents with above-stated exam for assessment of some left-sided chest discomfort rating down the left arm started yesterday as well as some left ear pressure in the setting of 5 days of headache.  Patient denies any fever shortness of breath, cough or other clear associated sick symptoms.  She was at urgent care earlier today who referred her to the emergency room with concerns of possible ACS.  On arrival here she is afebrile hemodynamically stable.  Differential considerations include acute viral URI, ACS, pneumothorax, pneumonia, PE, anemia, metabolic  derangements, tension headache, ECG remarkable for sinus rhythm with a ventricular rate of 81, normal axis, unremarkable intervals without clear evidence of acute ischemia or significant arrhythmia.  Patient denying any neck pain or shortness of breath to this examiner and states her chest discomfort is pressure-like and overall I have a low suspicion for dissection at this time.  Patient is PERC negative and have a low suspicion for PE.  She does not appear septic or meningitic and I do not see any evidence of deep space infection of the head or neck including evidence of otitis media on the left.  I suspect likely eustachian tube dysfunction.  EKG and nonelevated troponin x2 are not suggestive of ACS or myocarditis.  CBC  shows no leukocytosis or acute anemia.  BMP shows no significant electrolyte or metabolic derangements.  Pregnancy test is negative.  Chest x-ray shows no focal consolidation, effusion, edema, pneumothorax or other clear acute thoracic process.  BMP shows no significant electrolyte or metabolic derangements.  CBC shows no leukocytosis or acute anemia.  Pregnancy test is negative.  Overall unclear etiology for patient's symptoms.  And certainly possible she has an acute viral infection although she denies any congestion or fever or leukocytosis or findings on exam to suggest a bacterial sinusitis.  We will send a COVID influenza PCR which I advised patient she can follow-up online.  On my reassessment patient states she is feeling much better after below noted headache cocktail.  Her chest pressure is much better as well.  Given improvement in symptoms with otherwise stable vitals and reassuring exam work-up I think is stable for discharge with close outpatient follow-up.  Discharged in stable condition.  Strict return precautions advised and discussed.  ____________________________________________   FINAL CLINICAL IMPRESSION(S) / ED DIAGNOSES  Final diagnoses:  Chest pain,  unspecified type  Nonintractable headache, unspecified chronicity pattern, unspecified headache type    Medications  prochlorperazine (COMPAZINE) injection 10 mg (10 mg Intravenous Given 01/25/21 1318)  diphenhydrAMINE (BENADRYL) injection 25 mg (25 mg Intravenous Given 01/25/21 1318)  lactated ringers bolus 1,000 mL (1,000 mLs Intravenous New Bag/Given 01/25/21 1322)  magnesium sulfate IVPB 2 g 50 mL (0 g Intravenous Stopped 01/25/21 1424)  LORazepam (ATIVAN) injection 1 mg (1 mg Intravenous Given 01/25/21 1353)     ED Discharge Orders     None        Note:  This document was prepared using Dragon voice recognition software and may include unintentional dictation errors.    Lucrezia Starch, MD 01/25/21 [phone]

## 2021-01-25 NOTE — ED Provider Notes (Signed)
[facility]    CSN: [id number] Arrival date & time: 01/25/21  0840      History   Chief Complaint Chief Complaint  Patient presents with   Chest Pain    HPI Dawn Hunter is a 40 y.o. female presenting for left-sided chest pain and pressure with radiation to left arm for the past 6 to 8 hours.  Patient also reports feeling dizzy and nauseous.  No vomiting.  Patient states the chest pain "feels like something sitting on my chest."  Admits to a bit of left-sided neck pain and occipital headache for the past 5 days.  Has taken Tylenol for pain relief without any improvement in symptoms.  No visual disturbances, numbness/weakness.  No shortness of breath.  No palpitations.  Denies any similar problems in past.  Reports no history of heart attack or stroke in any family member under age 40.  Patient denies any medical problems.  No history of hypertension, hyperlipidemia, diabetes, blood clotting disorders.  Patient is otherwise healthy.  No regular medicines.  HPI  History reviewed. No pertinent past medical history.  There are no problems to display for this patient.   Past Surgical History:  Procedure Laterality Date   CESAREAN SECTION     TUBAL LIGATION      OB History   No obstetric history on file.      Home Medications    Prior to Admission medications   Medication Sig Start Date End Date Taking? Authorizing Provider  naproxen (NAPROSYN) 500 MG tablet Take 1 tablet (500 mg total) by mouth 2 (two) times daily with a meal. 12/10/20   Lavonia Drafts, MD    Family History No family history on file.  Social History Social History   Tobacco Use   Smoking status: Never   Smokeless tobacco: Never  Vaping Use   Vaping Use: Never used  Substance Use Topics   Alcohol use: Not Currently   Drug use: Not Currently     Allergies   Patient has no known allergies.   Review of Systems Review of Systems  Constitutional:  Positive for fatigue. Negative  for fever.  HENT:  Positive for congestion.   Eyes:  Negative for visual disturbance.  Respiratory:  Negative for cough and shortness of breath.   Cardiovascular:  Positive for chest pain. Negative for palpitations.  Gastrointestinal:  Positive for nausea. Negative for abdominal pain and vomiting.  Musculoskeletal:  Positive for neck pain and neck stiffness. Negative for back pain.  Neurological:  Positive for dizziness, light-headedness and headaches. Negative for syncope, facial asymmetry and numbness.  Psychiatric/Behavioral:  Negative for confusion.     Physical Exam Triage Vital Signs ED Triage Vitals [01/25/21 0859]  Enc Vitals Group     BP (!) 141/99     Pulse Rate 83     Resp 18     Temp 98.6 F (37 C)     Temp Source Oral     SpO2 98 %     Weight      Height      Head Circumference      Peak Flow      Pain Score 9     Pain Loc      Pain Edu?      Excl. in [place]?    No data found.  Updated Vital Signs BP (!) 141/99 (BP Location: Left Arm)    Pulse 83    Temp 98.6 F (37 C) (Oral)  Resp 18    SpO2 98%      Physical Exam Vitals and nursing note reviewed.  Constitutional:      General: She is in acute distress (appears uncomfortable and in pain, holding left arm).     Appearance: Normal appearance. She is not ill-appearing or toxic-appearing.  HENT:     Head: Normocephalic and atraumatic.     Nose: Nose normal.     Mouth/Throat:     Mouth: Mucous membranes are moist.     Pharynx: Oropharynx is clear.  Eyes:     General: No scleral icterus.       Right eye: No discharge.        Left eye: No discharge.     Extraocular Movements: Extraocular movements intact.     Conjunctiva/sclera: Conjunctivae normal.     Pupils: Pupils are equal, round, and reactive to light.     Comments: +photophobia  Cardiovascular:     Rate and Rhythm: Normal rate and regular rhythm.     Heart sounds: Normal heart sounds.  Pulmonary:     Effort: Pulmonary effort is normal. No  respiratory distress.     Breath sounds: Normal breath sounds. No wheezing, rhonchi or rales.  Musculoskeletal:     Cervical back: Normal range of motion and neck supple. Tenderness (TTP left paracervical muscles) present.  Skin:    General: Skin is dry.  Neurological:     General: No focal deficit present.     Mental Status: She is alert and oriented to person, place, and time. Mental status is at baseline.     Cranial Nerves: No cranial nerve deficit.     Motor: No weakness.     Coordination: Coordination normal.     Gait: Gait normal.     Comments: 5/5 strength bilat UE and LE  Psychiatric:        Mood and Affect: Mood normal.        Behavior: Behavior normal.        Thought Content: Thought content normal.     UC Treatments / Results  Labs (all labs ordered are listed, but only abnormal results are displayed) Labs Reviewed - No data to display  EKG   Radiology No results found.  Procedures ED EKG  Date/Time: 01/25/2021 9:24 AM Performed by: Danton Clap, PA-C Authorized by: Danton Clap, PA-C   Previous ECG:    Previous ECG:  Unavailable Interpretation:    Interpretation: normal   Rate:    ECG rate:  83   ECG rate assessment: normal   Rhythm:    Rhythm: sinus rhythm   Ectopy:    Ectopy: none   QRS:    QRS axis:  Normal   QRS intervals:  Normal   QRS conduction: normal   ST segments:    ST segments:  Normal T waves:    T waves: normal   Comments:     Normal sinus rhythm, regular rate (including critical care time)  Medications Ordered in UC Medications  ondansetron (ZOFRAN-ODT) disintegrating tablet 4 mg (4 mg Oral Given 01/25/21 0926)  aspirin chewable tablet 324 mg (324 mg Oral Given 01/25/21 0926)    Initial Impression / Assessment and Plan / UC Course  I have reviewed the triage vital signs and the nursing notes.  Pertinent labs & imaging results that were available during my care of the patient were reviewed by me and considered in  my medical decision making (see chart for details).  40 year old female with no medical problems presenting for left chest pain and left arm pain that started 6 to 8 hours ago.  With associated headache, dizziness, nausea.  Vitals are stable.  BP slightly elevated at 141/99.  Remainder of vital signs are normal.  Patient is in some distress.  Appears to be uncomfortable and in pain, clutching her left arm and chest at times.  Chest is clear to auscultation heart regular rate rhythm.  Normal cranial nerve exam and 5 out of 5 strength bilateral upper and lower extremities.  EKG performed today shows normal sinus rhythm and regular rate.  83 bpm.  No ST or T wave elevation or depression.  Patient's symptoms very concerning for acute MI.  Also discussed with patient the possibility of her pain could be due to tension headache, complicated migraine, herniated cervical disc, or other noncardiac condition.  Patient is reporting that her chest and arm hurt the most.  Patient drove herself here.  Advise she needs further evaluation in emergency department this time.  EMS contacted.  Patient given 4 mg ODT Zofran in clinic and 324 mg oral baby aspirin.  Nurse to start IV.  Report given to EMS.  Patient leaving in stable condition.  Final Clinical Impressions(s) / UC Diagnoses   Final diagnoses:  Chest pain, unspecified type  Left arm pain  Nausea without vomiting     Discharge Instructions      You have been advised to follow up immediately in the emergency department for concerning signs.symptoms. If you declined EMS transport, please have a family member take you directly to the ED at this time. Do not delay. Based on concerns about condition, if you do not follow up in th e ED, you may risk poor outcomes including worsening of condition, delayed treatment and potentially life threatening issues. If you have declined to go to the ED at this time, you should call your PCP immediately to set up a  follow up appointment.  Go to ED for red flag symptoms, including; fevers you cannot reduce with Tylenol/Motrin, severe headaches, vision changes, numbness/weakness in part of the body, lethargy, confusion, intractable vomiting, severe dehydration, chest pain, breathing difficulty, severe persistent abdominal or pelvic pain, signs of severe infection (increased redness, swelling of an area), feeling faint or passing out, dizziness, etc. You should especially go to the ED for sudden acute worsening of condition if you do not elect to go at this time.      ED Prescriptions   None    PDMP not reviewed this encounter.   Danton Clap, PA-C 01/25/21 1004

## 2021-01-25 NOTE — ED Notes (Signed)
Patient is being discharged from the Urgent Care and sent to the Emergency Department via EMS . Per Wolfgang Phoenix, patient is in need of higher level of care due to Chest pain. Patient is aware and verbalizes understanding of plan of care.  Vitals:   01/25/21 0859  BP: (!) 141/99  Pulse: 83  Resp: 18  Temp: 98.6 F (37 C)  SpO2: 98%

## 2021-02-24 ENCOUNTER — Ambulatory Visit: Payer: Self-pay | Admitting: *Deleted

## 2021-02-24 DIAGNOSIS — I10 Essential (primary) hypertension: Secondary | ICD-10-CM | POA: Diagnosis not present

## 2021-02-24 NOTE — Telephone Encounter (Signed)
Summary: Headache & dizzy sx   Pt is calling because she thinks she is having some blood pressure concerns. She does not have a cuff to take her bp. Pt reports feeling dizzy since yesterday about 11:30p with a headached. Pt was scheduled a new patient appt with [facility].        Chief Complaint: requesting appt  Symptoms: feels like BP elevated, dizziness, headache, nose bleeds, tired  Frequency: stated last night 1130 pm Pertinent Negatives: Patient denies chest pain, difficulty breathing, weakness on either side  Disposition: [] ED /[x] Urgent Care (no appt availability in office) / [] Appointment(In office/virtual)/ []  [facility]/ [] Home Care/ [] Refused Recommended Disposition /[] [facility]/ []  Follow-up with PCP Additional Notes:   Recommended UC and if sx worsen go to ED.       Reason for Disposition  Wants doctor to measure BP  Answer Assessment - Initial Assessment Questions 1. BLOOD PRESSURE: "What is the blood pressure?" "Did you take at least two measurements 5 minutes apart?"     Doesnot have BP monitor  2. ONSET: "When did you take your blood pressure?"     na 3. HOW: "How did you obtain the blood pressure?" (e.g., visiting nurse, automatic home BP monitor)     na 4. HISTORY: "Do you have a history of high blood pressure?"     Yes  5. MEDICATIONS: "Are you taking any medications for blood pressure?" "Have you missed any doses recently?"     no 6. OTHER SYMPTOMS: "Do you have any symptoms?" (e.g., headache, chest pain, blurred vision, difficulty breathing, weakness)     Headache, dizziness, feeling tired  7. PREGNANCY: "Is there any chance you are pregnant?" "When was your last menstrual period?"     na  Protocols used: Blood Pressure - High-A-AH

## 2021-03-15 ENCOUNTER — Ambulatory Visit: Payer: Medicaid Other | Admitting: Family Medicine

## 2021-04-06 ENCOUNTER — Ambulatory Visit: Payer: Medicaid Other | Admitting: Family Medicine

## 2021-05-04 ENCOUNTER — Ambulatory Visit
Admission: RE | Admit: 2021-05-04 | Discharge: 2021-05-04 | Disposition: A | Discharge status: Home / Self Care | Payer: Medicaid Other | Attending: Family Medicine | Admitting: Family Medicine

## 2021-05-04 ENCOUNTER — Encounter: Payer: Self-pay | Admitting: Family Medicine

## 2021-05-04 ENCOUNTER — Ambulatory Visit
Admission: RE | Admit: 2021-05-04 | Discharge: 2021-05-04 | Disposition: A | Discharge status: Home / Self Care | Payer: Medicaid Other | Source: Ambulatory Visit | Attending: Family Medicine | Admitting: Family Medicine

## 2021-05-04 ENCOUNTER — Ambulatory Visit: Payer: Medicaid Other | Admitting: Family Medicine

## 2021-05-04 VITALS — BP 140/90 | HR 88 | Ht 67.0 in | Wt 218.0 lb

## 2021-05-04 DIAGNOSIS — N62 Hypertrophy of breast: Secondary | ICD-10-CM | POA: Diagnosis not present

## 2021-05-04 DIAGNOSIS — M5417 Radiculopathy, lumbosacral region: Secondary | ICD-10-CM | POA: Diagnosis not present

## 2021-05-04 DIAGNOSIS — M47816 Spondylosis without myelopathy or radiculopathy, lumbar region: Secondary | ICD-10-CM | POA: Insufficient documentation

## 2021-05-04 DIAGNOSIS — I10 Essential (primary) hypertension: Secondary | ICD-10-CM | POA: Insufficient documentation

## 2021-05-04 DIAGNOSIS — M2578 Osteophyte, vertebrae: Secondary | ICD-10-CM | POA: Diagnosis not present

## 2021-05-04 DIAGNOSIS — M545 Low back pain, unspecified: Secondary | ICD-10-CM | POA: Diagnosis not present

## 2021-05-04 MED ORDER — CYCLOBENZAPRINE HCL 10 MG PO TABS: 10.0000 mg | ORAL_TABLET | Freq: Three times a day (TID) | ORAL | 0 refills | Status: DC | PRN

## 2021-05-04 MED ORDER — HYDROCHLOROTHIAZIDE 12.5 MG PO TABS: 12.5000 mg | ORAL_TABLET | Freq: Every day | ORAL | 0 refills | Status: DC

## 2021-05-04 NOTE — Assessment & Plan Note (Signed)
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  Evaluation and management have included prior imaging, physical therapy, medication management, spinal corticosteroid injections, and neural blocks.  Examination shows diffuse paraspinal lumbar tenderness, positive left-sided straight leg raise, equivocal FABER bilaterally, positive facet load/Kemps test.  At this stage, given her prior extensive work-up, will obtain outside medical records.  Plan for updated lumbar spine x-ray with order placed today.  Given comorbid hypertension, we will hold from NSAIDs, she can trial as needed cyclobenzaprine .

## 2021-05-04 NOTE — Assessment & Plan Note (Signed)
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  Evaluation and management have included prior imaging, physical therapy, medication management, spinal corticosteroid injections, and neural blocks. ? ?Examination shows diffuse paraspinal lumbar tenderness, positive left-sided straight leg raise, equivocal FABER bilaterally, positive facet load/Kemps test. ? ?At this stage, given her prior extensive work-up, will obtain outside medical records.  Plan for updated lumbar spine x-ray with order placed today.  Given comorbid hypertension, we will hold from NSAIDs, she can trial as needed cyclobenzaprine. ?

## 2021-05-04 NOTE — Assessment & Plan Note (Signed)
Chronic issue in the setting of elevated BMI, recent ER visits.  She denies any acute cardiopulmonary symptoms.  Her examination reveals positive S1 and S2, regular rate and rhythm, no additional heart sounds, no JVD, no carotid bruits, symmetric pulses in the extremities, no peripheral edema.  She has clear lung fields throughout without bibasilar rales. ? ?She is not on any current pharmacotherapy, will initiate hydrochlorothiazide 12.5 mg daily, close follow-up in 1 month we will recheck BP and determine the need for further titration.  That will be her annual physical where we will order multiple Risk stratification labs. ? ?Chronic issue, ongoing/uncontrolled, Rx management ?

## 2021-05-04 NOTE — Progress Notes (Signed)
?  ? ?Primary Care / Sports Medicine Office Visit ? ?Patient Information:  ?Patient ID: Dawn Hunter, female DOB: 17-Apr-1980 Age: 41 y.o. MRN: [id number]  ? ?Dawn Hunter is a pleasant 41 y.o. female presenting with the following: ? ?Chief Complaint  ?Patient presents with  ? Establish Care  ? Hypertension  ?  Never been on medication, been elevated for 2 years  ? Back Pain  ?  Started having pain in 2016, MRI in 2018. Pain management, steroid shot, acupuncture with no help.    ? ? ?Vitals:  ? 05/04/21 0853  ?BP: 140/90  ?Pulse: 88  ?SpO2: 98%  ? ?Vitals:  ? 05/04/21 0853  ?Weight: 218 lb (98.9 kg)  ?Height: '5\' 7"'$  (1.702 m)  ? ?Body mass index is 34.14 kg/m?. ? ?No results found.  ? ?Independent interpretation of notes and tests performed by another provider:  ? ?None ? ?Procedures performed:  ? ?None ? ?Pertinent History, Exam, Impression, and Recommendations:  ? ?Symptomatic mammary hypertrophy ?Patient presenting for establishment of care and brings up concern over symptomatic bilateral mammary hypertrophy with primarily musculoskeletal symptoms throughout the spine with additional focality at the lumbosacral region.  She had previously been preparing for breast reduction surgery but had to move to a different part of [place] and was unable to pursue surgery.  In regards to her symptoms, there are diffuse about the lumbosacral region, involves radiation to the left lower extremity, and has had multiple treatments inclusive of physical therapy, injections, pharmacotherapy.  We have requested outside medical records from her prior primary care provider.  Over the interim, a referral to plastic surgery for evaluation of breast reduction has been placed.  Patient is understanding and amenable to this plan. ? ?Hypertension ?Chronic issue in the setting of elevated BMI, recent ER visits.  She denies any acute cardiopulmonary symptoms.  Her examination reveals positive S1 and S2, regular rate and rhythm, no  additional heart sounds, no JVD, no carotid bruits, symmetric pulses in the extremities, no peripheral edema.  She has clear lung fields throughout without bibasilar rales. ? ?She is not on any current pharmacotherapy, will initiate hydrochlorothiazide 12.5 mg daily, close follow-up in 1 month we will recheck BP and determine the need for further titration.  That will be her annual physical where we will order multiple Risk stratification labs. ? ?Chronic issue, ongoing/uncontrolled, Rx management ? 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  Evaluation and management have included prior imaging, physical therapy, medication management, spinal corticosteroid injections, and neural blocks. ? ?Examination shows diffuse paraspinal lumbar tenderness, positive left-sided straight leg raise, equivocal FABER bilaterally, positive facet load/Kemps test. ? ?At this stage, given her prior extensive work-up, will obtain outside medical records.  Plan for updated lumbar spine x-ray with order placed today.  Given comorbid hypertension, we will hold from NSAIDs, she can trial as needed cyclobenzaprine.  ? ?Orders & Medications ?Meds ordered this encounter  ?Medications  ? hydrochlorothiazide (HYDRODIURIL) 12.5 MG tablet  ?  Sig: Take 1 tablet (12.5 mg total) by mouth daily.  ?  Dispense:  30 tablet  ?  Refill:  0  ? cyclobenzaprine (FLEXERIL) 10 MG tablet  ?  Sig: Take 1 tablet (10 mg total) by mouth 3 (three) times daily as needed for muscle spasms.  ?  Dispense:  30 tablet  ?  Refill:  0  ? ?Orders Placed This Encounter  ?Procedures  ? DG  Lumbar Spine Complete  ? Ambulatory referral to Plastic Surgery  ?  ? ?Return in about 4 weeks (around 06/01/2021) for Annual physical.  ?  ? ?Montel Culver, MD ? ? Primary Care Sports Medicine ?[facility] ?[place]  ? ?

## 2021-05-04 NOTE — Patient Instructions (Signed)
-   Referral coordinator will contact you in regards to surgeon scheduling ?- Obtain x-rays today ?- Start hydrochlorothiazide daily ?- Can use cyclobenzaprine every 8 hours on as-needed basis, side effect can be drowsiness ?- Return for annual physical in 4 weeks ?

## 2021-05-04 NOTE — Assessment & Plan Note (Addendum)
Patient presenting for establishment of care and brings up concern over symptomatic bilateral mammary hypertrophy with primarily musculoskeletal symptoms throughout the spine with additional focality at the lumbosacral region.  She had previously been preparing for breast reduction surgery but had to move to a different part of [place] and was unable to pursue surgery.  In regards to her symptoms, there are diffuse about the lumbosacral region, involves radiation to the left lower extremity, and has had multiple treatments inclusive of physical therapy, injections, pharmacotherapy.  We have requested outside medical records from her prior primary care provider.  Over the interim, a referral to plastic surgery for evaluation of breast reduction has been placed.  Patient is understanding and amenable to this plan. ?

## 2021-05-19 ENCOUNTER — Telehealth: Payer: Self-pay

## 2021-05-19 ENCOUNTER — Ambulatory Visit: Payer: Medicaid Other | Admitting: Plastic Surgery

## 2021-05-19 VITALS — BP 136/87 | HR 85 | Ht 67.0 in | Wt 219.0 lb

## 2021-05-19 DIAGNOSIS — M546 Pain in thoracic spine: Secondary | ICD-10-CM

## 2021-05-19 DIAGNOSIS — G8929 Other chronic pain: Secondary | ICD-10-CM | POA: Diagnosis not present

## 2021-05-19 DIAGNOSIS — Q831 Accessory breast: Secondary | ICD-10-CM

## 2021-05-19 DIAGNOSIS — N62 Hypertrophy of breast: Secondary | ICD-10-CM | POA: Diagnosis not present

## 2021-05-19 NOTE — Telephone Encounter (Signed)
Faxed signed release form to [facility] for notes treating yeast infections/rash under breast. ?

## 2021-05-21 NOTE — Progress Notes (Signed)
? ?Referring Provider ?Montel Culver, MD ?[address] ?[address] ?[place],  [place] [address]  ? ?CC:  ?Breast hypertrophy ? ? ?Darnice Comrie is an 41 y.o. female.  ?HPI: Patient is a 41 year old who is very interested in breast reduction.  In the past she has done a weight loss program and has worked with a Restaurant manager, fast food for over 6 weeks.  This was in 2019. ? ?Mammary Hyperplasia: ?The patient is a 41 y.o. female with a history of mammary hyperplasia for several years.  She has extremely large breasts causing symptoms that include the following: ?Back pain in the upper and lower back, including neck pain. She pulls or pins her bra straps to provide better lift and relief of the pressure and pain. She notices relief by holding her breast up manually.  Her shoulder straps cause grooves and pain and pressure that requires padding for relief. Pain medication is sometimes required with motrin and tylenol.  Activities that are hindered by enlarged breasts include: exercise and running.  She has tried supportive clothing as well as fitted bras without improvement. ? ?Preoperative bra size = G cup.   Mammogram history: 2022 [facility], will get updated mammogram. Family history of breast cancer: Grandmother and aunt..  Tobacco use: None.   The patient expresses the desire to pursue surgical intervention.  ? ?No Known Allergies ? ?Outpatient Encounter Medications as of 05/19/2021  ?Medication Sig  ? cyclobenzaprine (FLEXERIL) 10 MG tablet Take 1 tablet (10 mg total) by mouth 3 (three) times daily as needed for muscle spasms.  ? hydrochlorothiazide (HYDRODIURIL) 12.5 MG tablet Take 1 tablet (12.5 mg total) by mouth daily.  ? ?No facility-administered encounter medications on file as of 05/19/2021.  ?  ? ?No past medical history on file. ? ?Past Surgical History:  ?Procedure Laterality Date  ? CESAREAN SECTION    ? TUBAL LIGATION    ? ? ?Family History  ?Problem Relation Age of Onset  ? Hypertension Mother    ? Hypertension Father   ? Diabetes Father   ? Hypertension Son   ? Hypertension Maternal Grandmother   ? Diabetes Maternal Grandmother   ? Hypertension Maternal Grandfather   ? Hypertension Paternal Grandmother   ? Diabetes Paternal Grandmother   ? Cancer Paternal Grandmother   ?     Throat  ? Hypertension Paternal Grandfather   ? Cancer Paternal Grandfather   ? ? ?Social History  ? ?Social History Narrative  ? Not on file  ?  ? ?Review of Systems ?General: Denies fevers, chills, weight loss ?CV: Denies chest pain, shortness of breath, palpitations ? ? ?Physical Exam ? ?  05/19/2021  ?  1:46 PM 05/04/2021  ?  8:53 AM 01/25/2021  ?  3:04 PM  ?Vitals with BMI  ?Height '5\' 7"'$  '5\' 7"'$    ?Weight 219 lbs 218 lbs   ?BMI 34.29 34.14   ?Systolic 732 202 542  ?Diastolic 87 90 80  ?Pulse 85 88 66  ?  ?General:  No acute distress,  Alert and oriented, Non-Toxic, Normal speech and affect ?Breast: Her breasts are extremely large and fairly symmetric.  She has hyperpigmentation of the inframammary area on both sides.  The sternal to nipple distance on the right is 38 cm and the left is 39 cm.  The IMF distance is 15 cm.  Base width is 20 bilaterally.  Patient has bilaterally accessory breast tissue in both axillas. ? ?Assessment/Plan ?The patient has bilateral symptomatic macromastia.  She  is a good candidate for a breast reduction with possible free nipple given her size.  She is also a good candidate for removal of accessory breast tissue which I think can be done at the same time in her case.  She is interested in pursuing surgical treatment.  She has tried supportive garments and fitted bras with no relief.  The details of breast reduction surgery were discussed.  I explained the procedure in detail along the with the expected scars.  The risks were discussed in detail and include bleeding, infection, damage to surrounding structures, need for additional procedures, nipple loss, change in nipple sensation, persistent pain, contour  irregularities and asymmetries.  I explained that breast feeding is often not possible after breast reduction surgery.  We discussed the expected postoperative course with an overall recovery period of about 1 month.  She demonstrated full understanding of all risks.  We discussed her personal risk factors that include high BMI.  The patient is interested in pursuing surgical treatment. ? ? ?The estimated excess breast tissue to be removed at the time of surgery = >1000 grams on each side. ? ? ?Lennice Sites ?05/21/2021, 11:24 AM  ? ? ?  ?

## 2021-06-01 ENCOUNTER — Encounter: Payer: Self-pay | Admitting: Family Medicine

## 2021-06-01 ENCOUNTER — Other Ambulatory Visit (HOSPITAL_COMMUNITY)
Admission: RE | Admit: 2021-06-01 | Discharge: 2021-06-01 | Disposition: A | Discharge status: Home / Self Care | Payer: Medicaid Other | Source: Ambulatory Visit | Attending: Family Medicine | Admitting: Family Medicine

## 2021-06-01 ENCOUNTER — Ambulatory Visit (INDEPENDENT_AMBULATORY_CARE_PROVIDER_SITE_OTHER): Payer: Medicaid Other | Admitting: Family Medicine

## 2021-06-01 VITALS — BP 122/84 | HR 76 | Ht 67.0 in | Wt 219.0 lb

## 2021-06-01 DIAGNOSIS — Z1159 Encounter for screening for other viral diseases: Secondary | ICD-10-CM | POA: Diagnosis not present

## 2021-06-01 DIAGNOSIS — Z13220 Encounter for screening for lipoid disorders: Secondary | ICD-10-CM | POA: Diagnosis not present

## 2021-06-01 DIAGNOSIS — Z0000 Encounter for general adult medical examination without abnormal findings: Secondary | ICD-10-CM | POA: Diagnosis not present

## 2021-06-01 DIAGNOSIS — R7989 Other specified abnormal findings of blood chemistry: Secondary | ICD-10-CM

## 2021-06-01 DIAGNOSIS — I10 Essential (primary) hypertension: Secondary | ICD-10-CM

## 2021-06-01 DIAGNOSIS — Z113 Encounter for screening for infections with a predominantly sexual mode of transmission: Secondary | ICD-10-CM | POA: Diagnosis not present

## 2021-06-01 DIAGNOSIS — B354 Tinea corporis: Secondary | ICD-10-CM | POA: Diagnosis not present

## 2021-06-01 DIAGNOSIS — M5417 Radiculopathy, lumbosacral region: Secondary | ICD-10-CM | POA: Diagnosis not present

## 2021-06-01 DIAGNOSIS — Z114 Encounter for screening for human immunodeficiency virus [HIV]: Secondary | ICD-10-CM

## 2021-06-01 DIAGNOSIS — N62 Hypertrophy of breast: Secondary | ICD-10-CM | POA: Diagnosis not present

## 2021-06-01 MED ORDER — CLOTRIMAZOLE-BETAMETHASONE 1-0.05 % EX CREA: 1.0000 "application " | TOPICAL_CREAM | Freq: Every day | CUTANEOUS | 0 refills | Status: DC

## 2021-06-01 MED ORDER — CYCLOBENZAPRINE HCL 10 MG PO TABS: 10.0000 mg | ORAL_TABLET | Freq: Three times a day (TID) | ORAL | 0 refills | Status: DC | PRN

## 2021-06-01 MED ORDER — HYDROCHLOROTHIAZIDE 12.5 MG PO TABS: 12.5000 mg | ORAL_TABLET | Freq: Every day | ORAL | 0 refills | Status: DC

## 2021-06-01 NOTE — Assessment & Plan Note (Signed)
Has established with surgical group, work-up ongoing. ?

## 2021-06-01 NOTE — Assessment & Plan Note (Signed)
Annual examination completed, risk stratification labs ordered, anticipatory guidance provided.  We will follow labs once resulted.  Pap performed today. ?

## 2021-06-01 NOTE — Assessment & Plan Note (Signed)
Well-controlled on current regimen, plan to continue this Rx. ?

## 2021-06-01 NOTE — Patient Instructions (Signed)
-   Obtain fasting labs with orders provided (can have water or black coffee but otherwise no food or drink x 8 hours before labs) - Review information provided - Attend eye doctor annually, dentist every 6 months, work towards or maintain 30 minutes of moderate intensity physical activity at least 5 days per week, and consume a balanced diet - Return in 3 months - Contact us for any questions between now and then 

## 2021-06-01 NOTE — Progress Notes (Signed)
?  ? ?Annual Physical Exam Visit ? ?Patient Information:  ?Patient ID: Dawn Hunter, female DOB: 12-Mar-1980 Age: 41 y.o. MRN: [id number]  ? ?Subjective:  ? ?CC: Annual Physical Exam ? ?HPI:  ?Dawn Hunter is here for their annual physical. ? ?I reviewed the past medical history, family history, social history, surgical history, and allergies today and changes were made as necessary.  Please see the problem list section below for additional details. ? ?Past Medical History: ?History reviewed. No pertinent past medical history. ?Past Surgical History: ?Past Surgical History:  ?Procedure Laterality Date  ? CESAREAN SECTION    ? TUBAL LIGATION    ? ?Family History: ?Family History  ?Problem Relation Age of Onset  ? Hypertension Mother   ? Hypertension Father   ? Diabetes Father   ? Hypertension Son   ? Hypertension Maternal Grandmother   ? Diabetes Maternal Grandmother   ? Hypertension Maternal Grandfather   ? Hypertension Paternal Grandmother   ? Diabetes Paternal Grandmother   ? Cancer Paternal Grandmother   ?     Throat  ? Hypertension Paternal Grandfather   ? Cancer Paternal Grandfather   ? ?Allergies: ?No Known Allergies ?Health Maintenance: ?Health Maintenance  ?Topic Date Due  ? COVID-19 Vaccine (1) Never done  ? Hepatitis C Screening  Never done  ? PAP SMEAR-Modifier  Never done  ? TETANUS/TDAP  05/05/2022 (Originally 07/05/1999)  ? INFLUENZA VACCINE  08/29/2021  ? HIV Screening  Completed  ? HPV VACCINES  Aged Out  ?  ?HM Colonoscopy   ? ? This patient has no relevant Health Maintenance data.  ? ?  ? ?Medications: ?No current outpatient medications on file prior to visit.  ? ?No current facility-administered medications on file prior to visit.  ? ? ?Review of Systems: No headache, visual changes, nausea, vomiting, diarrhea, constipation, dizziness, abdominal pain, +skin rash, fevers, chills, night sweats, swollen lymph nodes, weight loss, chest pain, body aches, joint swelling, muscle aches, shortness of  breath, mood changes, visual or auditory hallucinations reported. ? ?Objective:  ? ?Vitals:  ? 06/01/21 0909  ?BP: 122/84  ?Pulse: 76  ?SpO2: 99%  ? ?Vitals:  ? 06/01/21 0909  ?Weight: 219 lb (99.3 kg)  ?Height: '5\' 7"'$  (1.702 m)  ? ?Body mass index is 34.3 kg/m?. ? ?General: Well Developed, well nourished, and in no acute distress.  ?Neuro: Alert and oriented x3, extra-ocular muscles intact, sensation grossly intact. Cranial nerves II through XII are grossly intact, motor, sensory, and coordinative functions are intact. ?HEENT: Normocephalic, atraumatic, pupils equal round reactive to light, neck supple, no masses, no lymphadenopathy, thyroid nonpalpable. Oropharynx, nasopharynx, external ear canals are unremarkable. ?Skin: Warm and dry, tinea corporis around the sternal region and underneath bilateral breasts, left greater than right  ?Cardiac: Regular rate and rhythm, no murmurs rubs or gallops. No peripheral edema. Pulses symmetric. ?Respiratory: Clear to auscultation bilaterally. Not using accessory muscles, speaking in full sentences.  ?Abdominal: Soft, nontender, nondistended, positive bowel sounds, no masses, no organomegaly. ?Musculoskeletal: Shoulder, elbow, wrist, hip, knee, ankle stable, and with full range of motion. ? ?Female chaperone initials: KP present throughout the physical examination. ? ?Impression and Recommendations:  ? ?The patient was counselled, risk factors were discussed, and anticipatory guidance given. ? ?Problem List Items Addressed This Visit   ? ?  ? Cardiovascular and Mediastinum  ? Hypertension  ?  Well-controlled on current regimen, plan to continue this Rx. ? ?  ?  ? Relevant Medications  ? hydrochlorothiazide (HYDRODIURIL) 12.5  MG tablet  ?  ? Nervous and Auditory  ? Left lumbosacral radiculopathy  ?  Persistent symptomatology, moderate response from Flexeril, anticipate improvement when she is able to address symptomatic mammary hypertrophy through surgical means, that processes  is pending. ? ?  ?  ? Relevant Medications  ? cyclobenzaprine (FLEXERIL) 10 MG tablet  ?  ? Musculoskeletal and Integument  ? Tinea corporis  ?  Noted primarily around the breast folds, plan for Lotrisone ? ?  ?  ? Relevant Medications  ? clotrimazole-betamethasone (LOTRISONE) cream  ?  ? Other  ? Symptomatic mammary hypertrophy  ?  Has established with surgical group, work-up ongoing. ? ?  ?  ? Annual physical exam - Primary  ?  Annual examination completed, risk stratification labs ordered, anticipatory guidance provided.  We will follow labs once resulted.  Pap performed today. ? ?  ?  ? Relevant Orders  ? Cytology - PAP  ? GC/Chlamydia Probe Amp  ? Apo A1 + B + Ratio  ? CBC  ? Comprehensive metabolic panel  ? Hepatitis C antibody  ? HIV Antibody (routine testing w rflx)  ? Lipid panel  ? TSH  ? VITAMIN D 25 Hydroxy (Vit-D Deficiency, Fractures)  ? STI Profile  ? ?Other Visit Diagnoses   ? ? Screening for HIV (human immunodeficiency virus)      ? Relevant Orders  ? HIV Antibody (routine testing w rflx)  ? Need for hepatitis C screening test      ? Relevant Orders  ? Hepatitis C antibody  ? Screening for lipoid disorders      ? Relevant Orders  ? Apo A1 + B + Ratio  ? Lipid panel  ? Low serum vitamin D      ? Relevant Orders  ? VITAMIN D 25 Hydroxy (Vit-D Deficiency, Fractures)  ? Routine screening for STI (sexually transmitted infection)      ? Relevant Orders  ? GC/Chlamydia Probe Amp  ? Hepatitis C antibody  ? HIV Antibody (routine testing w rflx)  ? STI Profile  ? ?  ?  ? ?Orders & Medications ?Medications:  ?Meds ordered this encounter  ?Medications  ? cyclobenzaprine (FLEXERIL) 10 MG tablet  ?  Sig: Take 1 tablet (10 mg total) by mouth 3 (three) times daily as needed for muscle spasms.  ?  Dispense:  30 tablet  ?  Refill:  0  ? hydrochlorothiazide (HYDRODIURIL) 12.5 MG tablet  ?  Sig: Take 1 tablet (12.5 mg total) by mouth daily.  ?  Dispense:  30 tablet  ?  Refill:  0  ? clotrimazole-betamethasone  (LOTRISONE) cream  ?  Sig: Apply 1 application. topically daily.  ?  Dispense:  30 g  ?  Refill:  0  ? ?Orders Placed This Encounter  ?Procedures  ? GC/Chlamydia Probe Amp  ? Apo A1 + B + Ratio  ? CBC  ? Comprehensive metabolic panel  ? Hepatitis C antibody  ? HIV Antibody (routine testing w rflx)  ? Lipid panel  ? TSH  ? VITAMIN D 25 Hydroxy (Vit-D Deficiency, Fractures)  ? STI Profile  ?  ? ?Return in about 3 months (around 09/01/2021).  ? ? ?Montel Culver, MD ? ? Primary Care Sports Medicine ?[facility] ?[place]  ? ?

## 2021-06-01 NOTE — Assessment & Plan Note (Signed)
>>  ASSESSMENT AND PLAN FOR LEFT LUMBOSACRAL RADICULOPATHY WRITTEN ON 06/01/2021  5:22 PM BY Tylasia Fletchall J, MD  Persistent symptomatology, moderate response from Flexeril , anticipate improvement when she is able to address symptomatic mammary hypertrophy through surgical means, that processes is pending.

## 2021-06-01 NOTE — Assessment & Plan Note (Signed)
Persistent symptomatology, moderate response from Flexeril, anticipate improvement when she is able to address symptomatic mammary hypertrophy through surgical means, that processes is pending. ?

## 2021-06-01 NOTE — Assessment & Plan Note (Signed)
Noted primarily around the breast folds, plan for Lotrisone ?

## 2021-06-02 ENCOUNTER — Ambulatory Visit (HOSPITAL_BASED_OUTPATIENT_CLINIC_OR_DEPARTMENT_OTHER): Admission: RE | Admit: 2021-06-02 | Payer: Medicaid Other | Source: Ambulatory Visit | Admitting: Radiology

## 2021-06-02 ENCOUNTER — Other Ambulatory Visit: Payer: Self-pay | Admitting: Family Medicine

## 2021-06-02 DIAGNOSIS — R7989 Other specified abnormal findings of blood chemistry: Secondary | ICD-10-CM

## 2021-06-02 LAB — COMPREHENSIVE METABOLIC PANEL
ALT: 15 IU/L (ref 0–32)
AST: 16 IU/L (ref 0–40)
Albumin/Globulin Ratio: 2 (ref 1.2–2.2)
Albumin: 4.5 g/dL (ref 3.8–4.8)
Alkaline Phosphatase: 73 IU/L (ref 44–121)
BUN/Creatinine Ratio: 10 (ref 9–23)
BUN: 7 mg/dL (ref 6–24)
Bilirubin Total: 0.4 mg/dL (ref 0.0–1.2)
CO2: 21 mmol/L (ref 20–29)
Calcium: 9 mg/dL (ref 8.7–10.2)
Chloride: 103 mmol/L (ref 96–106)
Creatinine, Ser: 0.68 mg/dL (ref 0.57–1.00)
Globulin, Total: 2.3 g/dL (ref 1.5–4.5)
Glucose: 90 mg/dL (ref 70–99)
Potassium: 4.1 mmol/L (ref 3.5–5.2)
Sodium: 142 mmol/L (ref 134–144)
Total Protein: 6.8 g/dL (ref 6.0–8.5)
eGFR: 113 mL/min/{1.73_m2} (ref >59)

## 2021-06-02 LAB — LIPID PANEL
Chol/HDL Ratio: 3.1 ratio (ref 0.0–4.4)
Cholesterol, Total: 180 mg/dL (ref 100–199)
HDL: 59 mg/dL (ref >39)
LDL Chol Calc (NIH): 105 mg/dL — ABNORMAL HIGH (ref 0–99)
Triglycerides: 86 mg/dL (ref 0–149)
VLDL Cholesterol Cal: 16 mg/dL (ref 5–40)

## 2021-06-02 LAB — STI PROFILE
HCV Ab: NONREACTIVE
HIV Screen 4th Generation wRfx: NONREACTIVE
Hep B Core Total Ab: NEGATIVE
Hep B Surface Ab, Qual: REACTIVE
Hepatitis B Surface Ag: NEGATIVE
RPR Ser Ql: NONREACTIVE

## 2021-06-02 LAB — TSH: TSH: 1.39 u[IU]/mL (ref 0.450–4.500)

## 2021-06-02 LAB — CYTOLOGY - PAP
Adequacy: ABSENT
Diagnosis: NEGATIVE

## 2021-06-02 LAB — CBC
Hematocrit: 38.1 % (ref 34.0–46.6)
Hemoglobin: 13.3 g/dL (ref 11.1–15.9)
MCH: 32 pg (ref 26.6–33.0)
MCHC: 34.9 g/dL (ref 31.5–35.7)
MCV: 92 fL (ref 79–97)
Platelets: 313 10*3/uL (ref 150–450)
RBC: 4.16 x10E6/uL (ref 3.77–5.28)
RDW: 12.7 % (ref 11.7–15.4)
WBC: 6.7 10*3/uL (ref 3.4–10.8)

## 2021-06-02 LAB — HCV INTERPRETATION

## 2021-06-02 LAB — APO A1 + B + RATIO
Apolipo. B/A-1 Ratio: 0.5 ratio (ref 0.0–0.6)
Apolipoprotein A-1: 170 mg/dL (ref 116–209)
Apolipoprotein B: 82 mg/dL (ref <90)

## 2021-06-02 LAB — HEPATITIS C ANTIBODY: Hep C Virus Ab: NONREACTIVE

## 2021-06-02 LAB — VITAMIN D 25 HYDROXY (VIT D DEFICIENCY, FRACTURES): Vit D, 25-Hydroxy: 13 ng/mL — ABNORMAL LOW (ref 30.0–100.0)

## 2021-06-02 MED ORDER — VITAMIN D (ERGOCALCIFEROL) 1.25 MG (50000 UNIT) PO CAPS: 50000.0000 [IU] | ORAL_CAPSULE | ORAL | 0 refills | Status: DC

## 2021-06-03 LAB — GC/CHLAMYDIA PROBE AMP
Chlamydia trachomatis, NAA: NEGATIVE
Neisseria Gonorrhoeae by PCR: NEGATIVE

## 2021-06-16 ENCOUNTER — Inpatient Hospital Stay (HOSPITAL_BASED_OUTPATIENT_CLINIC_OR_DEPARTMENT_OTHER): Admission: RE | Admit: 2021-06-16 | Payer: Medicaid Other | Source: Ambulatory Visit | Admitting: Radiology

## 2021-07-05 ENCOUNTER — Telehealth: Payer: Medicaid Other | Admitting: Physician Assistant

## 2021-07-05 DIAGNOSIS — L729 Follicular cyst of the skin and subcutaneous tissue, unspecified: Secondary | ICD-10-CM | POA: Diagnosis not present

## 2021-07-05 DIAGNOSIS — L089 Local infection of the skin and subcutaneous tissue, unspecified: Secondary | ICD-10-CM | POA: Diagnosis not present

## 2021-07-05 MED ORDER — DOXYCYCLINE HYCLATE 100 MG PO TABS: 100.0000 mg | ORAL_TABLET | Freq: Two times a day (BID) | ORAL | 0 refills | Status: DC

## 2021-07-05 NOTE — Progress Notes (Signed)
Virtual Visit Consent   Dawn Hunter, you are scheduled for a virtual visit with a [facility] provider today. Just as with appointments in the office, your consent must be obtained to participate. Your consent will be active for this visit and any virtual visit you may have with one of our providers in the next 365 days. If you have a MyChart account, a copy of this consent can be sent to you electronically.  As this is a virtual visit, video technology does not allow for your provider to perform a traditional examination. This may limit your provider's ability to fully assess your condition. If your provider identifies any concerns that need to be evaluated in person or the need to arrange testing (such as labs, EKG, etc.), we will make arrangements to do so. Although advances in technology are sophisticated, we cannot ensure that it will always work on either your end or our end. If the connection with a video visit is poor, the visit may have to be switched to a telephone visit. With either a video or telephone visit, we are not always able to ensure that we have a secure connection.  By engaging in this virtual visit, you consent to the provision of healthcare and authorize for your insurance to be billed (if applicable) for the services provided during this visit. Depending on your insurance coverage, you may receive a charge related to this service.  I need to obtain your verbal consent now. Are you willing to proceed with your visit today? Dawn Hunter has provided verbal consent on 07/05/2021 for a virtual visit (video or telephone). [name], [place]  Date: 07/05/2021 7:08 PM  Virtual Visit via Video Note   I, [name], connected with  Meryem Haertel  ([id number], [birth year]) on 07/05/21 at  7:00 PM EDT by a video-enabled telemedicine application and verified that I am speaking with the correct person using two identifiers.  Location: Patient: Virtual Visit Location  Patient: Home Provider: Virtual Visit Location Provider: Home Office   I discussed the limitations of evaluation and management by telemedicine and the availability of in person appointments. The patient expressed understanding and agreed to proceed.    History of Present Illness: Dawn Hunter is a 41 y.o. who identifies as a female who was assigned female at birth, and is being seen today for tenderness and swelling of chronic "cysts/boils" under her axilla bilaterally over the last several days. Notes these areas have been present since she gave birth to her son 11 years ago. Have slowly been getting larger since that time. Typically do not bother her but recently have become painful, warm and tender. Denies drainage. Denies fever, chills or malaise. Denies similar areas elsewhere. Denies any change to soaps, lotions, detergents, shaving creams, razors, etc .  HPI: HPI  Problems:  Patient Active Problem List   Diagnosis Date Noted   Tinea corporis 06/01/2021   Annual physical exam 06/01/2021   Symptomatic mammary hypertrophy 05/04/2021   Left lumbosacral radiculopathy 05/04/2021   Hypertension 05/04/2021    Allergies: No Known Allergies Medications:  Current Outpatient Medications:    clotrimazole-betamethasone (LOTRISONE) cream, Apply 1 application. topically daily., Disp: 30 g, Rfl: 0   cyclobenzaprine (FLEXERIL) 10 MG tablet, Take 1 tablet (10 mg total) by mouth 3 (three) times daily as needed for muscle spasms., Disp: 30 tablet, Rfl: 0   hydrochlorothiazide (HYDRODIURIL) 12.5 MG tablet, Take 1 tablet (12.5 mg total) by mouth daily., Disp: 30 tablet, Rfl: 0  Vitamin D, Ergocalciferol, (DRISDOL) 1.25 MG (50000 UNIT) CAPS capsule, Take 1 capsule (50,000 Units total) by mouth every 7 (seven) days. Take for 8 total doses(weeks), Disp: 8 capsule, Rfl: 0  Observations/Objective: Patient is well-developed, well-nourished in no acute distress.  Resting comfortably at home.  Head is  normocephalic, atraumatic.  No labored breathing. Speech is clear and coherent with logical content.  Patient is alert and oriented at baseline.  Golf-ball sized area of focal swelling and erythema under axilla bilaterally. No noted drainage. No evidence of scarring under the axilla. Area tender but without hardness.   Assessment and Plan: 1. Infected cyst of skin  Bilateral axilla. Infected cysts versus possible HS but no evidence of scarring from previous lesions/infections. Start Doxycycline. Cold compresses. Tylenol as needed. Want her to schedule follow-up with her PCP to make sure infection resolving but also for more in-depth assessment of these chronic lesions since there has been change over time and she denies and previous formal diagnosis.   Follow Up Instructions: I discussed the assessment and treatment plan with the patient. The patient was provided an opportunity to ask questions and all were answered. The patient agreed with the plan and demonstrated an understanding of the instructions.  A copy of instructions were sent to the patient via MyChart unless otherwise noted below.   The patient was advised to call back or seek an in-person evaluation if the symptoms worsen or if the condition fails to improve as anticipated.  Time:  I spent 10 minutes with the patient via telehealth technology discussing the above problems/concerns.    [name], PA-C

## 2021-07-05 NOTE — Patient Instructions (Signed)
  Dawn Hunter, thank you for joining [name], PA-C for today's virtual visit.  While this provider is not your primary care provider (PCP), if your PCP is located in our provider database this encounter information will be shared with them immediately following your visit.  Consent: (Patient) Dawn Hunter provided verbal consent for this virtual visit at the beginning of the encounter.  Current Medications:  Current Outpatient Medications:    clotrimazole-betamethasone (LOTRISONE) cream, Apply 1 application. topically daily., Disp: 30 g, Rfl: 0   cyclobenzaprine (FLEXERIL) 10 MG tablet, Take 1 tablet (10 mg total) by mouth 3 (three) times daily as needed for muscle spasms., Disp: 30 tablet, Rfl: 0   hydrochlorothiazide (HYDRODIURIL) 12.5 MG tablet, Take 1 tablet (12.5 mg total) by mouth daily., Disp: 30 tablet, Rfl: 0   Vitamin D, Ergocalciferol, (DRISDOL) 1.25 MG (50000 UNIT) CAPS capsule, Take 1 capsule (50,000 Units total) by mouth every 7 (seven) days. Take for 8 total doses(weeks), Disp: 8 capsule, Rfl: 0   Medications ordered in this encounter:  No orders of the defined types were placed in this encounter.    *If you need refills on other medications prior to your next appointment, please contact your pharmacy*  Follow-Up: Call back or seek an in-person evaluation if the symptoms worsen or if the condition fails to improve as anticipated.  Other Instructions Keep skin clean and dry. Take the antibiotic as directed. Apply cool compresses to the areas of concern. Alternate tylenol and ibuprofen.  Please call your PCP office to schedule a follow-up regarding these chronic areas.  If current symptoms are not resolving or any new/worsening symptoms despite treatment, please be seen ASAP.    If you have been instructed to have an in-person evaluation today at a local Urgent Care facility, please use the link below. It will take you to a list of all of our available  [facility], including address, phone number and hours of operation. Please do not delay care.  [facility]  If you or a family member do not have a primary care provider, use the link below to schedule a visit and establish care. When you choose a [facility] primary care physician or advanced practice provider, you gain a long-term partner in health. Find a Primary Care Provider  Learn more about [facility]'s in-office and virtual care options: [facility]

## 2021-08-03 ENCOUNTER — Other Ambulatory Visit: Payer: Self-pay | Admitting: Family Medicine

## 2021-08-03 DIAGNOSIS — R7989 Other specified abnormal findings of blood chemistry: Secondary | ICD-10-CM

## 2021-08-03 NOTE — Telephone Encounter (Signed)
Requested medications are due for refill today.  yes  Requested medications are on the active medications list.  yes  Last refill. 06/01/2021 #8 0 refills  Future visit scheduled.   yes  Notes to clinic.  Refill not delegated    Requested Prescriptions  Pending Prescriptions Disp Refills   Vitamin D, Ergocalciferol, (DRISDOL) 1.25 MG (50000 UNIT) CAPS capsule [Pharmacy Med Name: VITAMIN D2 50,000IU (ERGO) CAP RX] 8 capsule 0    Sig: TAKE ONE CAPSULE BY MOUTH EVERY 7 DAYS. TAKE FOR 8 TOTAL DOSES( WEEKS)     Endocrinology:  Vitamins - Vitamin D Supplementation 2 Failed - 08/03/2021 10:44 AM      Failed - Manual Review: Route requests for 50,000 IU strength to the provider      Failed - Vitamin D in normal range and within 360 days    Vit D, 25-Hydroxy  Date Value Ref Range Status  06/01/2021 13.0 (L) 30.0 - 100.0 ng/mL Final    Comment:    Vitamin D deficiency has been defined by the [place] practice guideline as a level of serum 25-OH vitamin D less than 20 ng/mL (1,2). The Endocrine Society went on to further define vitamin D insufficiency as a level between 21 and 29 ng/mL (2). 1. IOM (Institute of Medicine). 2010. Dietary reference    intakes for calcium and D. Post: The    [employer]. 2. Holick MF, Binkley [place], Bischoff-Ferrari HA, et al.    Evaluation, treatment, and prevention of vitamin D    deficiency: an Endocrine Society clinical practice    guideline. JCEM. 2011 Jul; 96(7):1911-30.          Passed - Ca in normal range and within 360 days    Calcium  Date Value Ref Range Status  06/01/2021 9.0 8.7 - 10.2 mg/dL Final         Passed - Valid encounter within last 12 months    Recent Outpatient Visits           2 months ago Annual physical exam   [facility] Montel Culver, MD   3 months ago Hypertension, unspecified type   [facility] Montel Culver, MD       Future  Appointments             In 4 weeks Zigmund Daniel, Earley Abide, MD [facility], [place]

## 2021-09-01 ENCOUNTER — Ambulatory Visit: Payer: Medicaid Other | Admitting: Family Medicine

## 2021-09-01 ENCOUNTER — Encounter: Payer: Self-pay | Admitting: Family Medicine

## 2021-09-01 VITALS — BP 120/78 | HR 91 | Ht 67.0 in | Wt 216.0 lb

## 2021-09-01 DIAGNOSIS — N62 Hypertrophy of breast: Secondary | ICD-10-CM

## 2021-09-01 DIAGNOSIS — Z23 Encounter for immunization: Secondary | ICD-10-CM | POA: Diagnosis not present

## 2021-09-01 DIAGNOSIS — R2233 Localized swelling, mass and lump, upper limb, bilateral: Secondary | ICD-10-CM | POA: Diagnosis not present

## 2021-09-01 DIAGNOSIS — I10 Essential (primary) hypertension: Secondary | ICD-10-CM

## 2021-09-01 DIAGNOSIS — M79629 Pain in unspecified upper arm: Secondary | ICD-10-CM | POA: Insufficient documentation

## 2021-09-01 DIAGNOSIS — M5417 Radiculopathy, lumbosacral region: Secondary | ICD-10-CM | POA: Diagnosis not present

## 2021-09-01 MED ORDER — CLINDAMYCIN HCL 300 MG PO CAPS: 300.0000 mg | ORAL_CAPSULE | Freq: Two times a day (BID) | ORAL | 0 refills | Status: DC

## 2021-09-01 MED ORDER — HYDROCHLOROTHIAZIDE 12.5 MG PO TABS: 12.5000 mg | ORAL_TABLET | Freq: Every day | ORAL | 1 refills | Status: DC

## 2021-09-01 MED ORDER — FLUCONAZOLE 150 MG PO TABS: 150.0000 mg | ORAL_TABLET | Freq: Once | ORAL | 3 refills | Status: AC

## 2021-09-01 NOTE — Assessment & Plan Note (Signed)
Was seen by surgeon, running to insurance barrier, is still significantly symptomatic throughout her thoracic and lumbosacral spine.  I encouraged her to touch base with her case manager to review this request further.

## 2021-09-01 NOTE — Assessment & Plan Note (Signed)
Chronic issue with described history of progression, bilateral axillary locations described as painful lumps and bumps, has been treated with antibiotics recently with no response.  Denies any pain radiating into the breast.  Examination reveals no skin changes including vesicles, eruptions, skin breakdown, erythema, palpation reveals scattered painful swollen areas, questionable lymphadenopathy.  Differential can include an element of chronic hidradenitis or may represent painful lymphadenopathy.  I discussed the same with the patient, a referral to dermatology has been placed as well as course of clindamycin, additionally a referral to breast clinic has been placed.  She has a pending mammogram that she will get done as well.

## 2021-09-01 NOTE — Assessment & Plan Note (Signed)
>>  ASSESSMENT AND PLAN FOR AXILLARY MASS, BILATERAL WRITTEN ON 09/01/2021 11:25 AM BY Dimas Scheck, Ocie Bob, MD  Chronic issue with described history of progression, bilateral axillary locations described as painful lumps and bumps, has been treated with antibiotics recently with no response.  Denies any pain radiating into the breast.  Examination reveals no skin changes including vesicles, eruptions, skin breakdown, erythema, palpation reveals scattered painful swollen areas, questionable lymphadenopathy.  Differential can include an element of chronic hidradenitis or may represent painful lymphadenopathy.  I discussed the same with the patient, a referral to dermatology has been placed as well as course of clindamycin, additionally a referral to breast clinic has been placed.  She has a pending mammogram that she will get done as well.

## 2021-09-01 NOTE — Assessment & Plan Note (Signed)
Chronic, controlled, asymptomatic from a cardiopulmonary standpoint, continue current regimen.

## 2021-09-01 NOTE — Assessment & Plan Note (Signed)
>>  ASSESSMENT AND PLAN FOR LEFT LUMBOSACRAL RADICULOPATHY WRITTEN ON 09/01/2021 11:26 AM BY Naaman Curro J, MD  Chronic issue with ongoing symptomatology in the setting of symptomatic mammary hypertrophy, has previously been established with outside spine group for regular spine injections.  We will try to coordinate the same, MRI lumbar spine without contrast ordered, referral to pain and spine group placed.

## 2021-09-01 NOTE — Assessment & Plan Note (Signed)
Chronic issue with ongoing symptomatology in the setting of symptomatic mammary hypertrophy, has previously been established with outside spine group for regular spine injections.  We will try to coordinate the same, MRI lumbar spine without contrast ordered, referral to pain and spine group placed.

## 2021-09-01 NOTE — Assessment & Plan Note (Signed)
Tdap administered today. 

## 2021-09-01 NOTE — Progress Notes (Signed)
Primary Care / Sports Medicine Office Visit  Patient Information:  Patient ID: Dawn Hunter, female DOB: 1980/07/24 Age: 41 y.o. MRN: [id number]   Dawn Hunter is a pleasant 41 y.o. female presenting with the following:  Chief Complaint  Patient presents with   Recurrent Skin Infections    Under arm pit, was put on medication in June, but doesn't think it helped (Doxy). Has happened since 2012     Vitals:   09/01/21 0903  BP: 120/78  Pulse: 91  SpO2: 99%   Vitals:   09/01/21 0903  Weight: 216 lb (98 kg)  Height: '5\' 7"'$  (1.702 m)   Body mass index is 33.83 kg/m.  No results found.   Independent interpretation of notes and tests performed by another provider:   None  Procedures performed:   None  Pertinent History, Exam, Impression, and Recommendations:   Problem List Items Addressed This Visit       Cardiovascular and Mediastinum   Hypertension    Chronic, controlled, asymptomatic from a cardiopulmonary standpoint, continue current regimen.      Relevant Medications   hydrochlorothiazide (HYDRODIURIL) 12.5 MG tablet     Nervous and Auditory   Left lumbosacral radiculopathy    Chronic issue with ongoing symptomatology in the setting of symptomatic mammary hypertrophy, has previously been established with outside spine group for regular spine injections.  We will try to coordinate the same, MRI lumbar spine without contrast ordered, referral to pain and spine group placed.      Relevant Orders   Ambulatory referral to Pain Clinic   MR Lumbar Spine Wo Contrast     Other   Symptomatic mammary hypertrophy    Was seen by surgeon, running to insurance barrier, is still significantly symptomatic throughout her thoracic and lumbosacral spine.  I encouraged her to touch base with her case manager to review this request further.      Axillary mass, bilateral - Primary    Chronic issue with described history of progression, bilateral axillary locations  described as painful lumps and bumps, has been treated with antibiotics recently with no response.  Denies any pain radiating into the breast.  Examination reveals no skin changes including vesicles, eruptions, skin breakdown, erythema, palpation reveals scattered painful swollen areas, questionable lymphadenopathy.  Differential can include an element of chronic hidradenitis or may represent painful lymphadenopathy.  I discussed the same with the patient, a referral to dermatology has been placed as well as course of clindamycin, additionally a referral to breast clinic has been placed.  She has a pending mammogram that she will get done as well.      Relevant Medications   clindamycin (CLEOCIN) 300 MG capsule   Other Relevant Orders   Ambulatory referral to Dermatology   Ambulatory referral to Breast Clinic   Need for Tdap vaccination    Tdap administered today      Relevant Orders   Tdap vaccine greater than or equal to 7yo IM (Completed)     Orders & Medications Meds ordered this encounter  Medications   hydrochlorothiazide (HYDRODIURIL) 12.5 MG tablet    Sig: Take 1 tablet (12.5 mg total) by mouth daily.    Dispense:  90 tablet    Refill:  1   clindamycin (CLEOCIN) 300 MG capsule    Sig: Take 1 capsule (300 mg total) by mouth 2 (two) times daily.    Dispense:  20 capsule    Refill:  0   fluconazole (DIFLUCAN) 150  MG tablet    Sig: Take 1 tablet (150 mg total) by mouth once for 1 dose.    Dispense:  1 tablet    Refill:  3   Orders Placed This Encounter  Procedures   MR Lumbar Spine Wo Contrast   Tdap vaccine greater than or equal to 7yo IM   Ambulatory referral to Pain Clinic   Ambulatory referral to Dermatology   Ambulatory referral to Breast Clinic     Return in about 6 months (around 03/04/2022).     Montel Culver, MD   Primary Care Sports Medicine [facility]

## 2021-09-11 ENCOUNTER — Ambulatory Visit: Payer: Medicaid Other | Admitting: Dermatology

## 2021-09-14 ENCOUNTER — Other Ambulatory Visit: Payer: Self-pay

## 2021-09-14 ENCOUNTER — Ambulatory Visit: Payer: Medicaid Other | Admitting: Surgery

## 2021-09-14 ENCOUNTER — Telehealth: Payer: Self-pay

## 2021-09-14 ENCOUNTER — Encounter: Payer: Self-pay | Admitting: Surgery

## 2021-09-14 VITALS — BP 130/85 | HR 82 | Temp 98.6°F | Ht 67.0 in | Wt 219.2 lb

## 2021-09-14 DIAGNOSIS — Z139 Encounter for screening, unspecified: Secondary | ICD-10-CM

## 2021-09-14 DIAGNOSIS — N62 Hypertrophy of breast: Secondary | ICD-10-CM

## 2021-09-14 DIAGNOSIS — M79629 Pain in unspecified upper arm: Secondary | ICD-10-CM | POA: Insufficient documentation

## 2021-09-14 NOTE — Patient Instructions (Signed)
wE will order the Mammogram and let you know the appointment information.

## 2021-09-14 NOTE — Progress Notes (Signed)
Patient ID: Dawn Hunter, female   DOB: [birth year], 41 y.o.   MRN: [id number]  Chief Complaint: Axillary issues  History of Present Illness Dawn Hunter is a 41 y.o. female with dermal issues involving bilateral axillae.  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  She is undergone extensive treatments and methodologies to try to help protect her skin from the various irritations, sloughing etc.  She has some chronic lingering deep axillary tenderness likely secondary to reactive lymphadenopathy from these issues.  She is seeing plastic surgery for breast reduction, however Medicaid will not cover this.  She does have a lot of neck and upper back issues that I thought would justify a reduction procedure. She reports having lost weight without remarkable resolution of her issues, however we have to admit that she has improved/made progress compared to what she describes in her past. She has utilized a number of different products and has recently seem to come about with complete clearance of her skin with good healing, no active drainage and no current inflammatory issues.  She does remain tender in the axillae, she apparently has lost the suspensory ligaments from her axilla causing a convexity adding more tissue between her arms and her breasts.  She denies any known breast issues, her last mammography was in October of last year, and we will proceed with obtaining her next screening mammogram.  She has a paternal cousin who has history of breast cancer.  She underwent menses at the age of 62.  She has been pregnant 6 times with 4 children.  Their current ages are 63, 71, 42 and 19.  She was 41 years old with her first pregnancy.  She denies ever nursing.  She denies any nipple discharge.  Past Medical History History reviewed. No pertinent past medical history.    Past Surgical History:  Procedure Laterality Date   CESAREAN SECTION      TUBAL LIGATION      No Known Allergies  Current Outpatient Medications  Medication Sig Dispense Refill   clindamycin (CLEOCIN) 300 MG capsule Take 1 capsule (300 mg total) by mouth 2 (two) times daily. 20 capsule 0   clotrimazole-betamethasone (LOTRISONE) cream Apply 1 application. topically daily. 30 g 0   cyclobenzaprine (FLEXERIL) 10 MG tablet Take 1 tablet (10 mg total) by mouth 3 (three) times daily as needed for muscle spasms. 30 tablet 0   hydrochlorothiazide (HYDRODIURIL) 12.5 MG tablet Take 1 tablet (12.5 mg total) by mouth daily. 90 tablet 1   No current facility-administered medications for this visit.    Family History Family History  Problem Relation Age of Onset   Hypertension Mother    Hypertension Father    Diabetes Father    Hypertension Son    Hypertension Maternal Grandmother    Diabetes Maternal Grandmother    Hypertension Maternal Grandfather    Hypertension Paternal Grandmother    Diabetes Paternal Grandmother    Cancer Paternal Grandmother        Throat   Hypertension Paternal Grandfather    Cancer Paternal Grandfather       Social History Social History   Tobacco Use   Smoking status: Never   Smokeless tobacco: Never  Vaping Use   Vaping Use: Never used  Substance Use Topics   Alcohol use: Not Currently   Drug use: Not Currently        Review of Systems  Constitutional: Negative.   HENT:  Negative.    Eyes: Negative.   Respiratory: Negative.    Cardiovascular: Negative.   Gastrointestinal: Negative.   Genitourinary: Negative.   Skin:  Positive for itching and rash.  Neurological:  Positive for tingling.  Psychiatric/Behavioral: Negative.        Physical Exam Blood pressure 130/85, pulse 82, temperature 98.6 F (37 C), temperature source Oral, height '5\' 7"'$  (1.702 m), weight 219 lb 3.2 oz (99.4 kg), SpO2 99 %. Last Weight  Most recent update: 09/14/2021  3:49 PM    Weight  99.4 kg (219 lb 3.2 oz)              CONSTITUTIONAL: Well developed, and nourished, appropriately responsive and aware without distress.   EYES: Sclera non-icteric.   EARS, NOSE, MOUTH AND THROAT:  The oropharynx is clear. Oral mucosa is pink and moist.     Hearing is intact to voice.  NECK: Trachea is midline, and there is no jugular venous distension.  LYMPH NODES:  Lymph nodes in the neck are not enlarged. RESPIRATORY:  Lungs are clear, and breath sounds are equal bilaterally. Normal respiratory effort without pathologic use of accessory muscles. CARDIOVASCULAR: Heart is regular in rate and rhythm. GI: The abdomen is soft, nontender, and nondistended. There were no palpable masses. I did not appreciate hepatosplenomegaly. There were normal bowel sounds. GU: Freda Munro is present as chaperone.  Both axilla show a convex shape which adds volume/soft tissue compression within her bilateral arms and chest wall.  She does have gigantomastia, currently without any evidence of edema involving the skin, no evidence of induration, no evidence of mass or dominant nodularity to warrant suspicion of any underlying breast neoplasia.  The skin in her axilla are intact and she does have some subjective tenderness in both.  Breast skin and sternal skin also are clear, as well as her inframammary areas as well.  I suspect she likely has some reactive lymphadenopathy present, contributing to the deep axillary tenderness. MUSCULOSKELETAL:  Symmetrical muscle tone appreciated in all four extremities.    SKIN: Skin turgor is normal. No pathologic skin lesions appreciated.  NEUROLOGIC:  Motor and sensation appear grossly normal.  Cranial nerves are grossly without defect. PSYCH:  Alert and oriented to person, place and time. Affect is appropriate for situation.  Data Reviewed I have personally reviewed what is currently available of the patient's imaging, recent labs and medical records.   Labs:     Latest Ref Rng & Units 06/01/2021   10:07 AM  01/25/2021   10:33 AM 12/09/2020    5:53 PM  CBC  WBC 3.4 - 10.8 x10E3/uL 6.7  6.1  8.4   Hemoglobin 11.1 - 15.9 g/dL 13.3  13.2  13.2   Hematocrit 34.0 - 46.6 % 38.1  37.7  37.2   Platelets 150 - 450 x10E3/uL 313  299  311       Latest Ref Rng & Units 06/01/2021   10:07 AM 01/25/2021   10:33 AM 12/09/2020    5:53 PM  CMP  Glucose 70 - 99 mg/dL 90  108  101   BUN 6 - 24 mg/dL '7  11  12   '$ Creatinine 0.57 - 1.00 mg/dL 0.68  0.70  0.71   Sodium 134 - 144 mmol/L 142  135  138   Potassium 3.5 - 5.2 mmol/L 4.1  3.8  3.9   Chloride 96 - 106 mmol/L 103  106  108   CO2 20 - 29 mmol/L 21  23  23   Calcium 8.7 - 10.2 mg/dL 9.0  8.7  9.1   Total Protein 6.0 - 8.5 g/dL 6.8     Total Bilirubin 0.0 - 1.2 mg/dL 0.4     Alkaline Phos 44 - 121 IU/L 73     AST 0 - 40 IU/L 16     ALT 0 - 32 IU/L 15         Imaging:  Within last 24 hrs: No results found.  Assessment    Skin and soft tissue issues of bilateral axillae, suspect mechanical and inflammatory in nature. Symptomatic bilateral gigantomastia. Patient Active Problem List   Diagnosis Date Noted   Axillary mass, bilateral 09/01/2021   Need for Tdap vaccination 09/01/2021   Tinea corporis 06/01/2021   Annual physical exam 06/01/2021   Symptomatic mammary hypertrophy 05/04/2021   Left lumbosacral radiculopathy 05/04/2021   Hypertension 05/04/2021    Plan    We will go ahead with screening evaluation scheduling for bilateral breasts and hopefully axillary evaluations. I do not have any particular recommendations to help her with her skin care as her skin is all intact at present. Referral to [facility] : [facility] breast/plastics for possible consideration of breast reduction.  Face-to-face time spent with the patient and accompanying care providers(if present) was 40 minutes, with more than 50% of the time spent counseling, educating, and coordinating care of the patient.    These notes generated with voice recognition software. I apologize for  typographical errors.  Ronny Bacon M.D., FACS 09/14/2021, 3:59 PM

## 2021-09-14 NOTE — Telephone Encounter (Signed)
Spoke with Roselyn Reef @ [facility] and she requested patient to sign release so they can send for previous films prior to patient being scheduled. Roselyn Reef will call patient once films are received.    Referral faxed to Dr[facility]enn[facility] plastic and reconstruction surgery at this time.   I let patient know someone from their office will call her once they have reviewed her information.

## 2021-09-14 NOTE — Addendum Note (Signed)
Addended by: Celene Kras on: 09/14/2021 05:06 PM   Modules accepted: Orders

## 2021-09-18 ENCOUNTER — Telehealth: Payer: Self-pay

## 2021-09-18 ENCOUNTER — Ambulatory Visit: Payer: Medicaid Other | Admitting: Dermatology

## 2021-09-18 DIAGNOSIS — D489 Neoplasm of uncertain behavior, unspecified: Secondary | ICD-10-CM

## 2021-09-18 DIAGNOSIS — R229 Localized swelling, mass and lump, unspecified: Secondary | ICD-10-CM

## 2021-09-18 NOTE — Progress Notes (Unsigned)
   New Patient Visit  Subjective  Dawn Hunter is a 41 y.o. female who presents for the following: New Patient (Initial Visit) (Patient here concerning masses under b/l axillary and 1 at left anterior thigh.  Patient reports growths have been there since 2012 and are very painful, itchy and swell at times. Patient is currently just using coconut oil in area. /).  The following portions of the chart were reviewed this encounter and updated as appropriate:   Tobacco  Allergies  Meds  Problems  Med Hx  Surg Hx  Fam Hx     Review of Systems:  No other skin or systemic complaints except as noted in HPI or Assessment and Plan.  Objective  Well appearing patient in no apparent distress; mood and affect are within normal limits.  A focused examination was performed including b/l axilla, left thigh. Relevant physical exam findings are noted in the Assessment and Plan.  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 Documentation: I have reviewed the above documentation for accuracy and completeness, and I agree with the above.  Sarina Ser, MD

## 2021-09-18 NOTE — Patient Instructions (Signed)
Due to recent changes in healthcare laws, you may see results of your pathology and/or laboratory studies on MyChart before the doctors have had a chance to review them. We understand that in some cases there may be results that are confusing or concerning to you. Please understand that not all results are received at the same time and often the doctors may need to interpret multiple results in order to provide you with the best plan of care or course of treatment. Therefore, we ask that you please give us 2 business days to thoroughly review all your results before contacting the office for clarification. Should we see a critical lab result, you will be contacted sooner.   If You Need Anything After Your Visit  If you have any questions or concerns for your doctor, please call our main line at [phone] and press option 4 to reach your doctor's medical assistant. If no one answers, please leave a voicemail as directed and we will return your call as soon as possible. Messages left after 4 pm will be answered the following business day.   You may also send us a message via MyChart. We typically respond to MyChart messages within 1-2 business days.  For prescription refills, please ask your pharmacy to contact our office. Our fax number is [phone].  If you have an urgent issue when the clinic is closed that cannot wait until the next business day, you can page your doctor at the number below.    Please note that while we do our best to be available for urgent issues outside of office hours, we are not available 24/7.   If you have an urgent issue and are unable to reach us, you may choose to seek medical care at your doctor's office, retail clinic, urgent care center, or emergency room.  If you have a medical emergency, please immediately call 911 or go to the emergency department.  Pager Numbers  - Dr. Kowalski: [phone]  - Dr. Moye: [phone]  - Dr. Stewart:  [phone]  In the event of inclement weather, please call our main line at [phone] for an update on the status of any delays or closures.  Dermatology Medication Tips: Please keep the boxes that topical medications come in in order to help keep track of the instructions about where and how to use these. Pharmacies typically print the medication instructions only on the boxes and not directly on the medication tubes.   If your medication is too expensive, please contact our office at [phone] option 4 or send us a message through MyChart.   We are unable to tell what your co-pay for medications will be in advance as this is different depending on your insurance coverage. However, we may be able to find a substitute medication at lower cost or fill out paperwork to get insurance to cover a needed medication.   If a prior authorization is required to get your medication covered by your insurance company, please allow us 1-2 business days to complete this process.  Drug prices often vary depending on where the prescription is filled and some pharmacies may offer cheaper prices.  The website www.goodrx.com contains coupons for medications through different pharmacies. The prices here do not account for what the cost may be with help from insurance (it may be cheaper with your insurance), but the website can give you the price if you did not use any insurance.  - You can print the associated coupon and take it with   your prescription to the pharmacy.  - You may also stop by our office during regular business hours and pick up a GoodRx coupon card.  - If you need your prescription sent electronically to a different pharmacy, notify our office through [facility] MyChart or by phone at [phone] option 4.     Si Usted Necesita Algo Despus de Su Visita  Tambin puede enviarnos un mensaje a travs de MyChart. Por lo general respondemos a los mensajes de MyChart en el transcurso de 1 a 2  das hbiles.  Para renovar recetas, por favor pida a su farmacia que se ponga en contacto con nuestra oficina. Nuestro nmero de fax es el [phone].  Si tiene un asunto urgente cuando la clnica est cerrada y que no puede esperar hasta el siguiente da hbil, puede llamar/localizar a su doctor(a) al nmero que aparece a continuacin.   Por favor, tenga en cuenta que aunque hacemos todo lo posible para estar disponibles para asuntos urgentes fuera del horario de oficina, no estamos disponibles las 24 horas del da, los 7 das de la semana.   Si tiene un problema urgente y no puede comunicarse con nosotros, puede optar por buscar atencin mdica  en el consultorio de su doctor(a), en una clnica privada, en un centro de atencin urgente o en una sala de emergencias.  Si tiene una emergencia mdica, por favor llame inmediatamente al 911 o vaya a la sala de emergencias.  Nmeros de bper  - Dr. Kowalski: [phone]  - Dra. Moye: [phone]  - Dra. Stewart: [phone]  En caso de inclemencias del tiempo, por favor llame a nuestra lnea principal al [phone] para una actualizacin sobre el estado de cualquier retraso o cierre.  Consejos para la medicacin en dermatologa: Por favor, guarde las cajas en las que vienen los medicamentos de uso tpico para ayudarle a seguir las instrucciones sobre dnde y cmo usarlos. Las farmacias generalmente imprimen las instrucciones del medicamento slo en las cajas y no directamente en los tubos del medicamento.   Si su medicamento es muy caro, por favor, pngase en contacto con nuestra oficina llamando al [phone] y presione la opcin 4 o envenos un mensaje a travs de MyChart.   No podemos decirle cul ser su copago por los medicamentos por adelantado ya que esto es diferente dependiendo de la cobertura de su seguro. Sin embargo, es posible que podamos encontrar un medicamento sustituto a menor costo o llenar un formulario para que el  seguro cubra el medicamento que se considera necesario.   Si se requiere una autorizacin previa para que su compaa de seguros cubra su medicamento, por favor permtanos de 1 a 2 das hbiles para completar este proceso.  Los precios de los medicamentos varan con frecuencia dependiendo del lugar de dnde se surte la receta y alguna farmacias pueden ofrecer precios ms baratos.  El sitio web www.goodrx.com tiene cupones para medicamentos de diferentes farmacias. Los precios aqu no tienen en cuenta lo que podra costar con la ayuda del seguro (puede ser ms barato con su seguro), pero el sitio web puede darle el precio si no utiliz ningn seguro.  - Puede imprimir el cupn correspondiente y llevarlo con su receta a la farmacia.  - Tambin puede pasar por nuestra oficina durante el horario de atencin regular y recoger una tarjeta de cupones de GoodRx.  - Si necesita que su receta se enve electrnicamente a una farmacia diferente, informe a nuestra oficina a travs de MyChart de [place]   o por telfono llamando al [phone] y presione la opcin 4.  

## 2021-09-18 NOTE — Telephone Encounter (Signed)
Left detailed message letting patient know referral sent to [facility] surgery Dr.Jennifer Robina Ade for breast reduction-has been denied-they do not accept medicare. I let patient know if she finds anyone that does accept medicare to let me know and I would be happy to send the referral- also asked patient to call when [place] [facility]i receives her outside films and schedules her for her mammogram.

## 2021-09-19 ENCOUNTER — Encounter: Payer: Self-pay | Admitting: Dermatology

## 2021-09-20 ENCOUNTER — Inpatient Hospital Stay
Admission: RE | Admit: 2021-09-20 | Discharge: 2021-09-20 | Disposition: A | Discharge status: Home / Self Care | Payer: Self-pay | Source: Ambulatory Visit | Attending: *Deleted | Admitting: *Deleted

## 2021-09-20 ENCOUNTER — Other Ambulatory Visit: Payer: Self-pay | Admitting: *Deleted

## 2021-09-20 DIAGNOSIS — Z1231 Encounter for screening mammogram for malignant neoplasm of breast: Secondary | ICD-10-CM

## 2021-09-26 ENCOUNTER — Ambulatory Visit
Admission: RE | Admit: 2021-09-26 | Discharge: 2021-09-26 | Disposition: A | Discharge status: Home / Self Care | Payer: Medicaid Other | Source: Ambulatory Visit | Attending: Surgery | Admitting: Surgery

## 2021-09-26 DIAGNOSIS — Z139 Encounter for screening, unspecified: Secondary | ICD-10-CM | POA: Insufficient documentation

## 2021-09-26 DIAGNOSIS — Z1231 Encounter for screening mammogram for malignant neoplasm of breast: Secondary | ICD-10-CM | POA: Diagnosis not present

## 2021-09-28 ENCOUNTER — Ambulatory Visit: Payer: Medicaid Other | Admitting: Surgery

## 2021-09-28 ENCOUNTER — Encounter: Payer: Self-pay | Admitting: Surgery

## 2021-09-28 ENCOUNTER — Other Ambulatory Visit: Payer: Self-pay

## 2021-09-28 VITALS — BP 130/90 | HR 78 | Temp 98.2°F | Ht 67.0 in | Wt 214.4 lb

## 2021-09-28 DIAGNOSIS — N62 Hypertrophy of breast: Secondary | ICD-10-CM

## 2021-09-28 DIAGNOSIS — M79629 Pain in unspecified upper arm: Secondary | ICD-10-CM

## 2021-09-28 NOTE — Progress Notes (Signed)
Ms. Veno presents today primarily to review her mammogram results, as at this moment they are currently unavailable. She has no other issues than those previously discussed/addressed. Without further delay, will allow her to proceed with her day. No repeat exam needed, will review her mammography, and discussed with her follow-up.

## 2021-10-19 ENCOUNTER — Telehealth: Payer: Self-pay | Admitting: Family Medicine

## 2021-10-19 ENCOUNTER — Telehealth: Payer: Self-pay | Admitting: Student in an Organized Health Care Education/Training Program

## 2021-10-19 ENCOUNTER — Ambulatory Visit
Payer: Medicaid Other | Attending: Student in an Organized Health Care Education/Training Program | Admitting: Student in an Organized Health Care Education/Training Program

## 2021-10-19 ENCOUNTER — Encounter: Payer: Self-pay | Admitting: Student in an Organized Health Care Education/Training Program

## 2021-10-19 VITALS — BP 137/98 | HR 72 | Temp 97.2°F | Resp 16 | Ht 67.0 in | Wt 215.3 lb

## 2021-10-19 DIAGNOSIS — M5136 Other intervertebral disc degeneration, lumbar region: Secondary | ICD-10-CM | POA: Diagnosis not present

## 2021-10-19 DIAGNOSIS — M47816 Spondylosis without myelopathy or radiculopathy, lumbar region: Secondary | ICD-10-CM | POA: Insufficient documentation

## 2021-10-19 DIAGNOSIS — G894 Chronic pain syndrome: Secondary | ICD-10-CM | POA: Diagnosis not present

## 2021-10-19 MED ORDER — CELECOXIB 100 MG PO CAPS: 100.0000 mg | ORAL_CAPSULE | Freq: Two times a day (BID) | ORAL | 0 refills | Status: DC

## 2021-10-19 NOTE — Telephone Encounter (Signed)
Copied from [place] [id number]. Topic: General - Inquiry >> Oct 19, 2021 10:31 AM Erskine Squibb wrote: Reason for CRM: Patient called in to check on the status of her MRI L spine and if it has been scheduled. She did say since she has medicaid and may require a prior authorization and that is why it is taking longer. Please assist patient further.

## 2021-10-19 NOTE — Telephone Encounter (Signed)
PT stated that her previous doctor couldn't get her MRI information send over. So patient wants to know if doctor can put in an order for her to get an MRI done.

## 2021-10-19 NOTE — Progress Notes (Signed)
Safety precautions to be maintained throughout the outpatient stay will include: orient to surroundings, keep bed in low position, maintain call bell within reach at all times, provide assistance with transfer out of bed and ambulation.  

## 2021-10-19 NOTE — Progress Notes (Signed)
Patient: Dawn Hunter  Service Category: E/M  Provider: Gillis Santa, MD  DOB: 1980-08-15  DOS: 10/19/2021  Referring Provider: Montel Culver, MD  MRN: [id number]  Setting: Ambulatory outpatient  PCP: Montel Culver, MD  Type: New Patient  Specialty: Interventional Pain Management    Location: Office  Delivery: Face-to-face     Primary Reason(s) for Visit: Encounter for initial evaluation of one or more chronic problems (new to examiner) potentially causing chronic pain, and posing a threat to normal musculoskeletal function. (Level of risk: High) CC: No chief complaint on file.  HPI  Dawn Hunter is a 41 y.o. year old, female patient, who comes for the first time to our practice referred by Montel Culver, MD for our initial evaluation of her chronic pain. She has Symptomatic mammary hypertrophy; Left lumbosacral radiculopathy; Hypertension; Tinea corporis; Annual physical exam; Axillary mass, bilateral; Need for Tdap vaccination; Pain in axilla; Axillary tenderness; Lumbar spondylosis; Lumbar degenerative disc disease; and Chronic pain syndrome on their problem list. Today she comes in for evaluation of her No chief complaint on file.  Pain Assessment: Location: Lower Back Radiating: down backs of legs bilat to calves Onset: More than a month ago Duration: Chronic pain Quality: Pressure, Throbbing Severity: 6 /10 (subjective, self-reported pain score)  Effect on ADL: limits daily activities - "I can't do things with my kids" Timing: Constant Modifying factors: steroid injections BP: (!) 137/98  HR: 72  Onset and Duration: Gradual Cause of pain: Unknown Severity: Getting worse, NAS-11 at its worse: 10/10, NAS-11 at its best: 6/10, NAS-11 now: 6/10, and NAS-11 on the average: 6/10 Timing: Not influenced by the time of the day, During activity or exercise, and After activity or exercise Aggravating Factors: Bending, Climbing, Kneeling, Lifiting, Motion, Prolonged sitting,  Prolonged standing, Squatting, Twisting, Walking, Walking uphill, Walking downhill, and Working Alleviating Factors:  none Associated Problems: Day-time cramps, Dizziness, Numbness, Sadness, Spasms, Tingling, Weakness, Pain that wakes patient up, and Pain that does not allow patient to sleep Quality of Pain: Annoying, Constant, Cramping, Cruel, Deep, Disabling, Distressing, Dreadful, Dull, Exhausting, Getting longer, Horrible, Nagging, Pressure-like, Punishing, Sharp, Shooting, Stabbing, Throbbing, Tingling, and Uncomfortable Previous Examinations or Tests: denies Previous Treatments: Epidural steroid injections and Narcotic medications  Patient is a pleasant 41 year old female who presents with a chief complaint of primarily low back pain with occasional radiation into bilateral legs.  She states that the majority of her pain is in her lower back.  It is worse with lumbar extension.  It is slightly relieved with forward flexion.  She has done physical therapy in the past however this was greater than 2 years ago.  She states that she has had lumbar spinal injections in the past in [place] where she previously lived.  She states that in contrast to previous pain, her pain is more localized in her lower lumbar spine.  Lumbar spine x-ray and previous MRI reveals lumbar facet arthropathy at L3-L4, L4-L5.  We discussed diagnostic lumbar facet medial branch nerve blocks and possible RFA.  She would like to avoid any sedative medications.  I informed her that we will focus primarily on interventional pain therapies.  Opioid therapy would not be an option.  Recommend trial of Celebrex and diagnostic lumbar facet medial branch nerve blocks as below.  Meds   Current Outpatient Medications:    celecoxib (CELEBREX) 100 MG capsule, Take 1 capsule (100 mg total) by mouth 2 (two) times daily after a meal., Disp: 60 capsule, Rfl: 0  hydrochlorothiazide (HYDRODIURIL) 12.5 MG tablet, Take 1 tablet (12.5 mg total)  by mouth daily., Disp: 90 tablet, Rfl: 1  Imaging Review   Narrative CLINICAL DATA:  Chronic low back pain, left side radiculopathy into calf, progressively worsening.  EXAM: LUMBAR SPINE - COMPLETE 4+ VIEW  COMPARISON:  None.  FINDINGS: Five non-rib-bearing lumbar vertebral bodies. Lower lumbar spine facet arthropathy. Osteophyte formation along the thoracic spine. There is no evidence of lumbar spine fracture. Alignment is normal. Intervertebral disc spaces are maintained.  Right upper quadrant surgical clips. Phleboliths noted overlying the pelvis.  IMPRESSION: No acute displaced fracture or traumatic listhesis of the lumbar spine.   Electronically Signed By: Iven Finn M.D. On: 05/05/2021 15:55   Reason For Exam: MRI   -  M51.9;MRI   -  M51.9   EXAM:  MRI LUMBAR SPINE WITHOUT CONTRAST   CLINICAL DATA:  MRI   -  M51.9; low back pain and left pain   COMPARISON:  None.   TECHNIQUE:  Multiplanar multi-sequence MR imaging of the lumbar spine was  performed without the administration of intravenous contrast.   FINDINGS:  22 x 19 mm cyst left kidney.   Vertebral body heights are normal as is alignment. Overall marrow  signal is normal. Normal conus ends at L2. Disc heights are  maintained. Decrease in the normal high T2 signal in the L5-S1 disc.  Type 2 endplate marrow changes T11-12. Multilevel facet joint  arthrosis resulting in mild joint hypertrophy in the lower lumbar  levels.   No canal or foraminal stenosis at any level. Small tear in the  posterior annulus L5-S1 disc. No disc herniation at any level   IMPRESSION:  Facet joint arthrosis. Small annular tear L5-S1 disc. No canal or  foraminal stenosis. No herniation   SIGNATURE:   Electronically Signed  By: Shella Spearing M.D.  On: 05/20/2019 12:13  SIGNATURE LINE:   Dictated by:    Zenia Resides, MD, Pollie Friar  Dictated DT/TM: 05/20/2019 11:54 am    Complexity Note: Imaging results reviewed.                          ROS  Cardiovascular: High blood pressure Pulmonary or Respiratory: Snoring  Neurological: No reported neurological signs or symptoms such as seizures, abnormal skin sensations, urinary and/or fecal incontinence, being born with an abnormal open spine and/or a tethered spinal cord Psychological-Psychiatric: No reported psychological or psychiatric signs or symptoms such as difficulty sleeping, anxiety, depression, delusions or hallucinations (schizophrenial), mood swings (bipolar disorders) or suicidal ideations or attempts Gastrointestinal: Vomiting blood (Ulcers) Genitourinary: Passing kidney stones Hematological: No reported hematological signs or symptoms such as prolonged bleeding, low or poor functioning platelets, bruising or bleeding easily, hereditary bleeding problems, low energy levels due to low hemoglobin or being anemic Endocrine: No reported endocrine signs or symptoms such as high or low blood sugar, rapid heart rate due to high thyroid levels, obesity or weight gain due to slow thyroid or thyroid disease Rheumatologic: No reported rheumatological signs and symptoms such as fatigue, joint pain, tenderness, swelling, redness, heat, stiffness, decreased range of motion, with or without associated rash Musculoskeletal: Negative for myasthenia gravis, muscular dystrophy, multiple sclerosis or malignant hyperthermia Work History: Working full time  Allergies  Dawn Hunter has No Known Allergies.  Laboratory Chemistry Profile   Renal Lab Results  Component Value Date   BUN 7 06/01/2021   CREATININE 0.68 06/01/2021   BCR 10 06/01/2021  GFRNONAA >60 01/25/2021   PROTEINUR NEGATIVE 12/10/2020     Electrolytes Lab Results  Component Value Date   NA 142 06/01/2021   K 4.1 06/01/2021   CL 103 06/01/2021   CALCIUM 9.0 06/01/2021     Hepatic Lab Results  Component Value Date   AST 16 06/01/2021   ALT 15 06/01/2021   ALBUMIN 4.5 06/01/2021   ALKPHOS 73  06/01/2021     ID Lab Results  Component Value Date   HIV Non Reactive 06/01/2021   SARSCOV2NAA NEGATIVE 01/25/2021   PREGTESTUR NEGATIVE 01/25/2021     Bone Lab Results  Component Value Date   VD25OH 13.0 (L) 06/01/2021     Endocrine Lab Results  Component Value Date   GLUCOSE 90 06/01/2021   GLUCOSEU NEGATIVE 12/10/2020   TSH 1.390 06/01/2021     Neuropathy Lab Results  Component Value Date   HIV Non Reactive 06/01/2021     CNS No results found for: "COLORCSF", "APPEARCSF", "RBCCOUNTCSF", "WBCCSF", "POLYSCSF", "LYMPHSCSF", "EOSCSF", "PROTEINCSF", "GLUCCSF", "JCVIRUS", "CSFOLI", "IGGCSF", "LABACHR", "ACETBL"   Inflammation (CRP: Acute  ESR: Chronic) No results found for: "CRP", "ESRSEDRATE", "LATICACIDVEN"   Rheumatology No results found for: "RF", "ANA", "LABURIC", "URICUR", "LYMEIGGIGMAB", "LYMEABIGMQN", "HLAB27"   Coagulation Lab Results  Component Value Date   PLT 313 06/01/2021     Cardiovascular Lab Results  Component Value Date   HGB 13.3 06/01/2021   HCT 38.1 06/01/2021     Screening Lab Results  Component Value Date   SARSCOV2NAA NEGATIVE 01/25/2021   HIV Non Reactive 06/01/2021   PREGTESTUR NEGATIVE 01/25/2021     Cancer No results found for: "CEA", "CA125", "LABCA2"   Allergens No results found for: "ALMOND", "APPLE", "ASPARAGUS", "AVOCADO", "BANANA", "BARLEY", "BASIL", "BAYLEAF", "GREENBEAN", "LIMABEAN", "WHITEBEAN", "BEEFIGE", "REDBEET", "BLUEBERRY", "BROCCOLI", "CABBAGE", "MELON", "CARROT", "CASEIN", "CASHEWNUT", "CAULIFLOWER", "CELERY"     Note: Lab results reviewed.  [place]  Drug: Dawn Hunter  reports that she does not currently use drugs. Alcohol:  reports that she does not currently use alcohol. Tobacco:  reports that she has never smoked. She has never used smokeless tobacco. Medical:  has a past medical history of Hypertension. Family: family history includes Breast cancer in her cousin; Cancer in her paternal grandfather and  paternal grandmother; Diabetes in her father, maternal grandmother, and paternal grandmother; Hypertension in her father, maternal grandfather, maternal grandmother, mother, paternal grandfather, paternal grandmother, and son.  Past Surgical History:  Procedure Laterality Date   CESAREAN SECTION     TUBAL LIGATION     Active Ambulatory Problems    Diagnosis Date Noted   Symptomatic mammary hypertrophy 05/04/2021   Left lumbosacral radiculopathy 05/04/2021   Hypertension 05/04/2021   Tinea corporis 06/01/2021   Annual physical exam 06/01/2021   Axillary mass, bilateral 09/01/2021   Need for Tdap vaccination 09/01/2021   Pain in axilla 09/14/2021   Axillary tenderness 09/14/2021   Lumbar spondylosis 10/19/2021   Lumbar degenerative disc disease 10/19/2021   Chronic pain syndrome 10/19/2021   Resolved Ambulatory Problems    Diagnosis Date Noted   No Resolved Ambulatory Problems   No Additional Past Medical History   Constitutional Exam  General appearance: Well nourished, well developed, and well hydrated. In no apparent acute distress Vitals:   10/19/21 0940  BP: (!) 137/98  Pulse: 72  Resp: 16  Temp: (!) 97.2 F (36.2 C)  TempSrc: Temporal  SpO2: 100%  Weight: 215 lb 4.8 oz (97.7 kg)  Height: _0  (1.702 m)   BMI Assessment:  Estimated body mass index is 33.72 kg/m as calculated from the following:   Height as of this encounter: _0  (1.702 m).   Weight as of this encounter: 215 lb 4.8 oz (97.7 kg).  BMI interpretation table: BMI level Category Range association with higher incidence of chronic pain  <18 kg/m2 Underweight   18.5-24.9 kg/m2 Ideal body weight   25-29.9 kg/m2 Overweight Increased incidence by 20%  30-34.9 kg/m2 Obese (Class I) Increased incidence by 68%  35-39.9 kg/m2 Severe obesity (Class II) Increased incidence by 136%  >40 kg/m2 Extreme obesity (Class III) Increased incidence by 254%   Patient's current BMI Ideal Body weight  Body mass index  is 33.72 kg/m. Ideal body weight: 61.6 kg (135 lb 12.9 oz) Adjusted ideal body weight: 76 kg (167 lb 9.6 oz)   BMI Readings from Last 4 Encounters:  10/19/21 33.72 kg/m  09/28/21 33.58 kg/m  09/14/21 34.33 kg/m  09/01/21 33.83 kg/m   Wt Readings from Last 4 Encounters:  10/19/21 215 lb 4.8 oz (97.7 kg)  09/28/21 214 lb 6.4 oz (97.3 kg)  09/14/21 219 lb 3.2 oz (99.4 kg)  09/01/21 216 lb (98 kg)    Psych/Mental status: Alert, oriented x 3 (person, place, & time)       Eyes: PERLA Respiratory: No evidence of acute respiratory distress  Lumbar Spine Area Exam  Skin & Axial Inspection: No masses, redness, or swelling Alignment: Symmetrical Functional ROM: Pain restricted ROM affecting both sides Stability: No instability detected Muscle Tone/Strength: Functionally intact. No obvious neuro-muscular anomalies detected. Sensory (Neurological): Musculoskeletal pain pattern facet mediated Palpation: No palpable anomalies       Provocative Tests: Hyperextension/rotation test: (+) bilaterally for facet joint pain. Lumbar quadrant test (Kemp's test): (+) bilaterally for facet joint pain. Lateral bending test: deferred today        Gait & Posture Assessment  Ambulation: Unassisted Gait: Relatively normal for age and body habitus Posture: WNL  Lower Extremity Exam    Side: Right lower extremity  Side: Left lower extremity  Stability: No instability observed          Stability: No instability observed          Skin & Extremity Inspection: Skin color, temperature, and hair growth are WNL. No peripheral edema or cyanosis. No masses, redness, swelling, asymmetry, or associated skin lesions. No contractures.  Skin & Extremity Inspection: Skin color, temperature, and hair growth are WNL. No peripheral edema or cyanosis. No masses, redness, swelling, asymmetry, or associated skin lesions. No contractures.  Functional ROM: Unrestricted ROM                  Functional ROM: Unrestricted ROM                   Muscle Tone/Strength: Functionally intact. No obvious neuro-muscular anomalies detected.  Muscle Tone/Strength: Functionally intact. No obvious neuro-muscular anomalies detected.  Sensory (Neurological): Unimpaired        Sensory (Neurological): Unimpaired        DTR: Patellar: deferred today Achilles: deferred today Plantar: deferred today  DTR: Patellar: deferred today Achilles: deferred today Plantar: deferred today  Palpation: No palpable anomalies  Palpation: No palpable anomalies      Assessment  Primary Diagnosis & Pertinent Problem List: The primary encounter diagnosis was Lumbar facet arthropathy. Diagnoses of Lumbar spondylosis, Lumbar degenerative disc disease, and Chronic pain syndrome were also pertinent to this visit.  Visit Diagnosis (New problems to examiner): 1. Lumbar facet arthropathy  2. Lumbar spondylosis   3. Lumbar degenerative disc disease   4. Chronic pain syndrome    Plan of Care (Initial workup plan)   Dawn Hunter has a history of greater than 3 months of moderate to severe pain which is resulted in functional impairment.  The patient has tried various conservative therapeutic options such as NSAIDs, Tylenol, muscle relaxants, physical therapy which was inadequately effective.  Patient's pain is predominantly axial with physical exam findings and x-ray and lumbar MRI findings suggestive of facet arthropathy. Lumbar facet medial branch nerve blocks were discussed with the patient.  Risks and benefits were reviewed.  Patient would like to proceed with bilateral L3, L4, L5 medial branch nerve block.  I will recommend repeating a course of physical therapy  I also recommend Celebrex trial as below.  Patient instructed to refrain from any NSAIDs while taking Celebrex.  I have also encouraged her to reach out to Dr. Zigmund Daniel clinic to see what the update is on the lumbar MRI that was ordered.  I recommend a up-to-date MRI's for work-up and for  subsequent development of treatment plan.   Procedure Orders         LUMBAR FACET(MEDIAL BRANCH NERVE BLOCK) MBNB     Orders Placed This Encounter  Procedures   LUMBAR FACET(MEDIAL BRANCH NERVE BLOCK) MBNB    Standing Status:   Future    Standing Expiration Date:   01/18/2022    Scheduling Instructions:     Procedure: Lumbar facet block (AKA.: Lumbosacral medial branch nerve block)     Side: Bilateral     Level: L3-4 & L5-S1 Facets (L3, L4, L5, Medial Branch Nerves)     Sedation:PO Valium     Timeframe: ASAA    Order Specific Question:   Where will this procedure be performed?    Answer:   [facility]   Ambulatory referral to Physical Therapy    Referral Priority:   Routine    Referral Type:   Physical Medicine    Referral Reason:   Specialty Services Required    Requested Specialty:   Physical Therapy    Number of Visits Requested:   1     Pharmacotherapy (current): Medications ordered:  Meds ordered this encounter  Medications   celecoxib (CELEBREX) 100 MG capsule    Sig: Take 1 capsule (100 mg total) by mouth 2 (two) times daily after a meal.    Dispense:  60 capsule    Refill:  0   Medications administered during this visit: Vianne Bulls had no medications administered during this visit.       Provider-requested follow-up: Return in about 3 weeks (around 11/09/2021) for B/L L3, 4, 5 MBNB #1, in clinic (PO Valium).   I spent a total of 60 minutes reviewing chart data, face-to-face evaluation with the patient, counseling and coordination of care as detailed above.   Future Appointments  Date Time Provider [place]  03/05/2022  9:40 AM Zigmund Daniel, Earley Abide, MD [facility]    Note by: Gillis Santa, MD Date: 10/19/2021; Time: 12:09 PM

## 2021-10-19 NOTE — Telephone Encounter (Signed)
Spoke with [employer], her [employer] will not cover an MRI regardless of who orders it. Patient notified.

## 2021-10-19 NOTE — Telephone Encounter (Signed)
PC to pt, informed pt it had been denied, and for her to follow up with Dr. Holley Raring for further instructions. Will contact us for any questions or concerns.   -Eissa Buchberger

## 2021-10-19 NOTE — Patient Instructions (Addendum)
Please reach out to Dr Zigmund Daniel re L-MRI. I need that to review and develop treatment plan Plan for diagnostic lumbar facets Start Celebrex as below Discussed heat to your lower back  GENERAL RISKS AND COMPLICATIONS  What are the risk, side effects and possible complications? Generally speaking, most procedures are safe.  However, with any procedure there are risks, side effects, and the possibility of complications.  The risks and complications are dependent upon the sites that are lesioned, or the type of nerve block to be performed.  The closer the procedure is to the spine, the more serious the risks are.  Great care is taken when placing the radio frequency needles, block needles or lesioning probes, but sometimes complications can occur. Infection: Any time there is an injection through the skin, there is a risk of infection.  This is why sterile conditions are used for these blocks.  There are four possible types of infection. Localized skin infection. Central Nervous System Infection-This can be in the form of Meningitis, which can be deadly. Epidural Infections-This can be in the form of an epidural abscess, which can cause pressure inside of the spine, causing compression of the spinal cord with subsequent paralysis. This would require an emergency surgery to decompress, and there are no guarantees that the patient would recover from the paralysis. Discitis-This is an infection of the intervertebral discs.  It occurs in about 1% of discography procedures.  It is difficult to treat and it may lead to surgery.        2. Pain: the needles have to go through skin and soft tissues, will cause soreness.       3. Damage to internal structures:  The nerves to be lesioned may be near blood vessels or    other nerves which can be potentially damaged.       4. Bleeding: Bleeding is more common if the patient is taking blood thinners such as  aspirin, Coumadin, Ticiid, Plavix, etc., or if he/she have  some genetic predisposition  such as hemophilia. Bleeding into the spinal canal can cause compression of the spinal  cord with subsequent paralysis.  This would require an emergency surgery to  decompress and there are no guarantees that the patient would recover from the  paralysis.       5. Pneumothorax:  Puncturing of a lung is a possibility, every time a needle is introduced in  the area of the chest or upper back.  Pneumothorax refers to free air around the  collapsed lung(s), inside of the thoracic cavity (chest cavity).  Another two possible  complications related to a similar event would include: Hemothorax and Chylothorax.   These are variations of the Pneumothorax, where instead of air around the collapsed  lung(s), you may have blood or chyle, respectively.       6. Spinal headaches: They may occur with any procedures in the area of the spine.       7. Persistent CSF (Cerebro-Spinal Fluid) leakage: This is a rare problem, but may occur  with prolonged intrathecal or epidural catheters either due to the formation of a fistulous  track or a dural tear.       8. Nerve damage: By working so close to the spinal cord, there is always a possibility of  nerve damage, which could be as serious as a permanent spinal cord injury with  paralysis.       9. Death:  Although rare, severe deadly allergic reactions known as "  Anaphylactic  reaction" can occur to any of the medications used.      10. Worsening of the symptoms:  We can always make thing worse.  What are the chances of something like this happening? Chances of any of this occuring are extremely low.  By statistics, you have more of a chance of getting killed in a motor vehicle accident: while driving to the hospital than any of the above occurring .  Nevertheless, you should be aware that they are possibilities.  In general, it is similar to taking a shower.  Everybody knows that you can slip, hit your head and get killed.  Does that mean that you  should not shower again?  Nevertheless always keep in mind that statistics do not mean anything if you happen to be on the wrong side of them.  Even if a procedure has a 1 (one) in a 1,000,000 (million) chance of going wrong, it you happen to be that one..Also, keep in mind that by statistics, you have more of a chance of having something go wrong when taking medications.  Who should not have this procedure? If you are on a blood thinning medication (e.g. Coumadin, Plavix, see list of "Blood Thinners"), or if you have an active infection going on, you should not have the procedure.  If you are taking any blood thinners, please inform your physician.  How should I prepare for this procedure? Do not eat or drink anything at least six hours prior to the procedure. Bring a driver with you .  It cannot be a taxi. Come accompanied by an adult that can drive you back, and that is strong enough to help you if your legs get weak or numb from the local anesthetic. Take all of your medicines the morning of the procedure with just enough water to swallow them. If you have diabetes, make sure that you are scheduled to have your procedure done first thing in the morning, whenever possible. If you have diabetes, take only half of your insulin dose and notify our nurse that you have done so as soon as you arrive at the clinic. If you are diabetic, but only take blood sugar pills (oral hypoglycemic), then do not take them on the morning of your procedure.  You may take them after you have had the procedure. Do not take aspirin or any aspirin-containing medications, at least eleven (11) days prior to the procedure.  They may prolong bleeding. Wear loose fitting clothing that may be easy to take off and that you would not mind if it got stained with Betadine or blood. Do not wear any jewelry or perfume Remove any nail coloring.  It will interfere with some of our monitoring equipment.  NOTE: Remember that this is not  meant to be interpreted as a complete list of all possible complications.  Unforeseen problems may occur.  BLOOD THINNERS The following drugs contain aspirin or other products, which can cause increased bleeding during surgery and should not be taken for 2 weeks prior to and 1 week after surgery.  If you should need take something for relief of minor pain, you may take acetaminophen which is found in Tylenol,m Datril, Anacin-3 and Panadol. It is not blood thinner. The products listed below are.  Do not take any of the products listed below in addition to any listed on your instruction sheet.  A.P.C or A.P.C with Codeine Codeine Phosphate Capsules #3 Ibuprofen Ridaura  ABC compound Congesprin Imuran rimadil  Advil Cope Indocin Robaxisal  Alka-Seltzer Effervescent Pain Reliever and Antacid Coricidin or Coricidin-D  Indomethacin Rufen  Alka-Seltzer plus Cold Medicine Cosprin Ketoprofen S-A-C Tablets  Anacin Analgesic Tablets or Capsules Coumadin Korlgesic Salflex  Anacin Extra Strength Analgesic tablets or capsules CP-2 Tablets Lanoril Salicylate  Anaprox Cuprimine Capsules Levenox Salocol  Anexsia-D Dalteparin Magan Salsalate  Anodynos Darvon compound Magnesium Salicylate Sine-off  Ansaid Dasin Capsules Magsal Sodium Salicylate  Anturane Depen Capsules Marnal Soma  APF Arthritis pain formula Dewitt's Pills Measurin Stanback  Argesic Dia-Gesic Meclofenamic Sulfinpyrazone  Arthritis Bayer Timed Release Aspirin Diclofenac Meclomen Sulindac  Arthritis pain formula Anacin Dicumarol Medipren Supac  Analgesic (Safety coated) Arthralgen Diffunasal Mefanamic Suprofen  Arthritis Strength Bufferin Dihydrocodeine Mepro Compound Suprol  Arthropan liquid Dopirydamole Methcarbomol with Aspirin Synalgos  ASA tablets/Enseals Disalcid Micrainin Tagament  Ascriptin Doan's Midol Talwin  Ascriptin A/D Dolene Mobidin Tanderil  Ascriptin Extra Strength Dolobid Moblgesic Ticlid  Ascriptin with Codeine Doloprin or  Doloprin with Codeine Momentum Tolectin  Asperbuf Duoprin Mono-gesic Trendar  Aspergum Duradyne Motrin or Motrin IB Triminicin  Aspirin plain, buffered or enteric coated Durasal Myochrisine Trigesic  Aspirin Suppositories Easprin Nalfon Trillsate  Aspirin with Codeine Ecotrin Regular or Extra Strength Naprosyn Uracel  Atromid-S Efficin Naproxen Ursinus  Auranofin Capsules Elmiron Neocylate Vanquish  Axotal Emagrin Norgesic Verin  Azathioprine Empirin or Empirin with Codeine Normiflo Vitamin E  Azolid Emprazil Nuprin Voltaren  Bayer Aspirin plain, buffered or children's or timed BC Tablets or powders Encaprin Orgaran Warfarin Sodium  Buff-a-Comp Enoxaparin Orudis Zorpin  Buff-a-Comp with Codeine Equegesic Os-Cal-Gesic   Buffaprin Excedrin plain, buffered or Extra Strength Oxalid   Bufferin Arthritis Strength Feldene Oxphenbutazone   Bufferin plain or Extra Strength Feldene Capsules Oxycodone with Aspirin   Bufferin with Codeine Fenoprofen Fenoprofen Pabalate or Pabalate-SF   Buffets II Flogesic Panagesic   Buffinol plain or Extra Strength Florinal or Florinal with Codeine Panwarfarin   Buf-Tabs Flurbiprofen Penicillamine   Butalbital Compound Four-way cold tablets Penicillin   Butazolidin Fragmin Pepto-Bismol   Carbenicillin Geminisyn Percodan   Carna Arthritis Reliever Geopen Persantine   Carprofen Gold's salt Persistin   Chloramphenicol Goody's Phenylbutazone   Chloromycetin Haltrain Piroxlcam   Clmetidine heparin Plaquenil   Cllnoril Hyco-pap Ponstel   Clofibrate Hydroxy chloroquine Propoxyphen         Before stopping any of these medications, be sure to consult the physician who ordered them.  Some, such as Coumadin (Warfarin) are ordered to prevent or treat serious conditions such as "deep thrombosis", "pumonary embolisms", and other heart problems.  The amount of time that you may need off of the medication may also vary with the medication and the reason for which you were  taking it.  If you are taking any of these medications, please make sure you notify your pain physician before you undergo any procedures.          Facet Blocks Patient Information  Description: The facets are joints in the spine between the vertebrae.  Like any joints in the body, facets can become irritated and painful.  Arthritis can also effect the facets.  By injecting steroids and local anesthetic in and around these joints, we can temporarily block the nerve supply to them.  Steroids act directly on irritated nerves and tissues to reduce selling and inflammation which often leads to decreased pain.  Facet blocks may be done anywhere along the spine from the neck to the low back depending upon the location of your pain.   After numbing  the skin with local anesthetic (like Novocaine), a small needle is passed onto the facet joints under x-ray guidance.  You may experience a sensation of pressure while this is being done.  The entire block usually lasts about 15-25 minutes.   Conditions which may be treated by facet blocks:  Low back/buttock pain Neck/shoulder pain Certain types of headaches  Preparation for the injection:  Do not eat any solid food or dairy products within 8 hours of your appointment. You may drink clear liquid up to 3 hours before appointment.  Clear liquids include water, black coffee, juice or soda.  No milk or cream please. You may take your regular medication, including pain medications, with a sip of water before your appointment.  Diabetics should hold regular insulin (if taken separately) and take 1/2 normal NPH dose the morning of the procedure.  Carry some sugar containing items with you to your appointment. A driver must accompany you and be prepared to drive you home after your procedure. Bring all your current medications with you. An IV may be inserted and sedation may be given at the discretion of the physician. A blood pressure cuff, EKG and other  monitors will often be applied during the procedure.  Some patients may need to have extra oxygen administered for a short period. You will be asked to provide medical information, including your allergies and medications, prior to the procedure.  We must know immediately if you are taking blood thinners (like Coumadin/Warfarin) or if you are allergic to IV iodine contrast (dye).  We must know if you could possible be pregnant.  Possible side-effects:  Bleeding from needle site Infection (rare, may require surgery) Nerve injury (rare) Numbness & tingling (temporary) Difficulty urinating (rare, temporary) Spinal headache (a headache worse with upright posture) Light-headedness (temporary) Pain at injection site (serveral days) Decreased blood pressure (rare, temporary) Weakness in arm/leg (temporary) Pressure sensation in back/neck (temporary)   Call if you experience:  Fever/chills associated with headache or increased back/neck pain Headache worsened by an upright position New onset, weakness or numbness of an extremity below the injection site Hives or difficulty breathing (go to the emergency room) Inflammation or drainage at the injection site(s) Severe back/neck pain greater than usual New symptoms which are concerning to you  Please note:  Although the local anesthetic injected can often make your back or neck feel good for several hours after the injection, the pain will likely return. It takes 3-7 days for steroids to work.  You may not notice any pain relief for at least one week.  If effective, we will often do a series of 2-3 injections spaced 3-6 weeks apart to maximally decrease your pain.  After the initial series, you may be a candidate for a more permanent nerve block of the facets.  If you have any questions, please call #[phone]) [facility]

## 2021-10-26 ENCOUNTER — Ambulatory Visit: Payer: Medicaid Other | Admitting: Student in an Organized Health Care Education/Training Program

## 2021-11-15 ENCOUNTER — Other Ambulatory Visit: Payer: Self-pay | Admitting: Student in an Organized Health Care Education/Training Program

## 2021-11-15 DIAGNOSIS — M47816 Spondylosis without myelopathy or radiculopathy, lumbar region: Secondary | ICD-10-CM

## 2021-11-15 DIAGNOSIS — G894 Chronic pain syndrome: Secondary | ICD-10-CM

## 2022-01-26 ENCOUNTER — Encounter: Payer: Self-pay | Admitting: Family Medicine

## 2022-01-26 ENCOUNTER — Emergency Department
Admission: EM | Admit: 2022-01-26 | Discharge: 2022-01-26 | Discharge status: Against Medical Advice | Payer: Medicaid Other | Attending: Emergency Medicine | Admitting: Emergency Medicine

## 2022-01-26 ENCOUNTER — Other Ambulatory Visit: Payer: Self-pay

## 2022-01-26 ENCOUNTER — Ambulatory Visit: Payer: Medicaid Other | Admitting: Family Medicine

## 2022-01-26 VITALS — BP 132/82 | HR 98 | Ht 67.0 in | Wt 212.0 lb

## 2022-01-26 DIAGNOSIS — R3989 Other symptoms and signs involving the genitourinary system: Secondary | ICD-10-CM | POA: Insufficient documentation

## 2022-01-26 DIAGNOSIS — Z5321 Procedure and treatment not carried out due to patient leaving prior to being seen by health care provider: Secondary | ICD-10-CM | POA: Insufficient documentation

## 2022-01-26 DIAGNOSIS — M545 Low back pain, unspecified: Secondary | ICD-10-CM | POA: Diagnosis not present

## 2022-01-26 DIAGNOSIS — N3090 Cystitis, unspecified without hematuria: Secondary | ICD-10-CM | POA: Insufficient documentation

## 2022-01-26 DIAGNOSIS — M47817 Spondylosis without myelopathy or radiculopathy, lumbosacral region: Secondary | ICD-10-CM | POA: Diagnosis not present

## 2022-01-26 DIAGNOSIS — R8290 Unspecified abnormal findings in urine: Secondary | ICD-10-CM | POA: Diagnosis not present

## 2022-01-26 LAB — POCT URINALYSIS DIPSTICK
Bilirubin, UA: NEGATIVE
Glucose, UA: NEGATIVE
Ketones, UA: NEGATIVE
Nitrite, UA: NEGATIVE
Protein, UA: NEGATIVE
Spec Grav, UA: 1.015 (ref 1.010–1.025)
Urobilinogen, UA: 0.2 E.U./dL
pH, UA: 6.5 (ref 5.0–8.0)

## 2022-01-26 LAB — URINALYSIS, ROUTINE W REFLEX MICROSCOPIC
Bacteria, UA: NONE SEEN
Bilirubin Urine: NEGATIVE
Glucose, UA: NEGATIVE mg/dL
Ketones, ur: NEGATIVE mg/dL
Leukocytes,Ua: NEGATIVE
Nitrite: NEGATIVE
Protein, ur: NEGATIVE mg/dL
Specific Gravity, Urine: 1.018 (ref 1.005–1.030)
pH: 6 (ref 5.0–8.0)

## 2022-01-26 MED ORDER — METHOCARBAMOL 500 MG PO TABS: 500.0000 mg | ORAL_TABLET | Freq: Three times a day (TID) | ORAL | 0 refills | Status: DC | PRN

## 2022-01-26 MED ORDER — PREDNISONE 50 MG PO TABS: 50.0000 mg | ORAL_TABLET | Freq: Every day | ORAL | 0 refills | Status: DC

## 2022-01-26 MED ORDER — FLUCONAZOLE 150 MG PO TABS: 150.0000 mg | ORAL_TABLET | Freq: Once | ORAL | 0 refills | Status: AC

## 2022-01-26 MED ORDER — NITROFURANTOIN MONOHYD MACRO 100 MG PO CAPS: 100.0000 mg | ORAL_CAPSULE | Freq: Two times a day (BID) | ORAL | 0 refills | Status: DC

## 2022-01-26 MED ORDER — GABAPENTIN 300 MG PO CAPS
300.0000 mg | ORAL_CAPSULE | Freq: Every day | ORAL | 0 refills | Status: DC
Start: 2022-01-26 — End: 2022-02-01

## 2022-01-26 MED ORDER — TRAMADOL HCL 50 MG PO TABS: 50.0000 mg | ORAL_TABLET | Freq: Three times a day (TID) | ORAL | 0 refills | Status: AC | PRN

## 2022-01-26 MED ORDER — CELECOXIB 100 MG PO CAPS: 100.0000 mg | ORAL_CAPSULE | Freq: Two times a day (BID) | ORAL | 0 refills | Status: DC

## 2022-01-26 NOTE — Assessment & Plan Note (Signed)
Describes pelvic pressure with urination over the past 1 week, denies fevers or chills, performed urine dip revealing leukocytes and blood, urine will be sent for further culture evaluation.  Over the interim Macrobid prescribed, Diflucan written per patient request.

## 2022-01-26 NOTE — Assessment & Plan Note (Signed)
Chronic condition, acute exacerbation noted over the past week, did coincide with increased walking.  Pain to the left lower back with radiation circumferentially to the lateral left hip.  Of note, she had previously established with pain and spine group, was scheduled to have injections performed but ran into insurance barriers.  She denies any bowel/bladder change beyond pelvic pressure during urination, this is addressed elsewhere.  Otherwise denies paresthesias, weakness, trauma.  Examination today with painful ambulation, sitting in a flexed forward position at the hip, severe tenderness most focal to the paraspinal left lower lumbosacral region, focal tenderness at the left SI joint and left greater trochanter.  Negative straight leg raise, positive Faber, negative FADIR, positive Kemps test localizing to the left, provocative testing otherwise benign.  Findings are consistent with severe flare of lumbosacral spondylosis with focality to the left SI joint and left greater trochanter which can be considered secondary/compensatory in nature.  Plan for prednisone course, Celebrex, gabapentin, as needed Robaxin, close follow-up with low threshold to advance to SI joint and left greater trochanter corticosteroid injections.  Once appropriate, referral to PT and recalcitrant symptoms can be referred to pain and spine for review of management options.

## 2022-01-26 NOTE — Patient Instructions (Addendum)
Take the following medications daily - Prednisone (until medication completed) - Celebrex twice daily - Gabapentin nightly - Macrobid antibiotic (until medication completed)  Take the following medications as needed - Methocarbamol (muscle relaxer) - Tramadol (painkiller) - Diflucan (if noting yeast)  - Perform gentle activity using low back/hip symptoms as a guide - Return for follow-up next week, contact for questions between now and then

## 2022-01-26 NOTE — Progress Notes (Signed)
Primary Care / Sports Medicine Office Visit  Patient Information:  Patient ID: Dawn Hunter, female DOB: 31-Jul-1980 Age: 41 y.o. MRN: [id number]   Dawn Hunter is a pleasant 41 y.o. female presenting with the following:  Chief Complaint  Patient presents with   Back Pain    Pressure     Vitals:   01/26/22 1309 01/26/22 1312  BP: 132/82 132/82  Pulse: 98   SpO2: 98%    Vitals:   01/26/22 1309  Weight: 212 lb (96.2 kg)  Height: '5\' 7"'$  (1.702 m)   Body mass index is 33.2 kg/m.  No results found.   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  Pain to the left lower back with radiation circumferentially to the lateral left hip.  Of note, she had previously established with pain and spine group, was scheduled to have injections performed but ran into insurance barriers.  She denies any bowel/bladder change beyond pelvic pressure during urination, this is addressed elsewhere.  Otherwise denies paresthesias, weakness, trauma.  Examination today with painful ambulation, sitting in a flexed forward position at the hip, severe tenderness most focal to the paraspinal left lower lumbosacral region, focal tenderness at the left SI joint and left greater trochanter.  Negative straight leg raise, positive Faber, negative FADIR, positive Kemps test localizing to the left, provocative testing otherwise benign.  Findings are consistent with severe flare of lumbosacral spondylosis with focality to the left SI joint and left greater trochanter which can be considered  secondary/compensatory in nature.  Plan for prednisone course, Celebrex, gabapentin, as needed Robaxin, close follow-up with low threshold to advance to SI joint and left greater trochanter corticosteroid injections.  Once appropriate, referral to PT and recalcitrant symptoms can be referred to pain and spine for review of management options.      Relevant Medications   predniSONE (DELTASONE) 50 MG tablet   celecoxib (CELEBREX) 100 MG capsule   methocarbamol (ROBAXIN) 500 MG tablet   traMADol (ULTRAM) 50 MG tablet   Other Relevant Orders   POCT urinalysis dipstick (Completed)     Genitourinary   Cystitis    Describes pelvic pressure with urination over the past 1 week, denies fevers or chills, performed urine dip revealing leukocytes and blood, urine will be sent for further culture evaluation.  Over the interim Macrobid prescribed, Diflucan written per patient request.      Relevant Orders   Urine Culture     Orders & Medications Meds ordered this encounter  Medications   nitrofurantoin, macrocrystal-monohydrate, (MACROBID) 100 MG capsule    Sig: Take 1 capsule (100 mg total) by mouth 2 (two) times daily.    Dispense:  14 capsule    Refill:  0   predniSONE (DELTASONE) 50 MG tablet    Sig: Take 1 tablet (50 mg total) by mouth daily.    Dispense:  5 tablet    Refill:  0   celecoxib (CELEBREX) 100 MG capsule    Sig: Take 1 capsule (100 mg total) by mouth 2 (two) times daily.    Dispense:  60 capsule  Refill:  0   methocarbamol (ROBAXIN) 500 MG tablet    Sig: Take 1 tablet (500 mg total) by mouth every 8 (eight) hours as needed for muscle spasms.    Dispense:  90 tablet    Refill:  0   gabapentin (NEURONTIN) 300 MG capsule    Sig: Take 1 capsule (300 mg total) by mouth at bedtime. One tab PO qHS for a week, then BID for a week, then TID. May double weekly to a max of 3,'600mg'$ /day    Dispense:  30 capsule    Refill:  0   traMADol (ULTRAM) 50 MG tablet    Sig: Take 1 tablet  (50 mg total) by mouth every 8 (eight) hours as needed for up to 5 days.    Dispense:  15 tablet    Refill:  0   fluconazole (DIFLUCAN) 150 MG tablet    Sig: Take 1 tablet (150 mg total) by mouth once for 1 dose.    Dispense:  1 tablet    Refill:  0   Orders Placed This Encounter  Procedures   Urine Culture   POCT urinalysis dipstick     Return in about 5 days (around 01/31/2022).     Montel Culver, MD, [facility]   Primary Care Sports Medicine Primary Care and Sports Medicine at [facility]

## 2022-01-26 NOTE — Assessment & Plan Note (Signed)
>>  ASSESSMENT AND PLAN FOR SPONDYLOSIS OF LUMBOSACRAL REGION WITHOUT MYELOPATHY OR RADICULOPATHY WRITTEN ON 01/26/2022  1:52 PM BY Jeanpaul Biehl J, MD  Chronic condition, acute exacerbation noted over the past week, did coincide with increased walking.  Pain to the left lower back with radiation circumferentially to the lateral left hip.  Of note, she had previously established with pain and spine group, was scheduled to have injections performed but ran into insurance barriers.  She denies any bowel/bladder change beyond pelvic pressure during urination, this is addressed elsewhere.  Otherwise denies paresthesias, weakness, trauma.  Examination today with painful ambulation, sitting in a flexed forward position at the hip, severe tenderness most focal to the paraspinal left lower lumbosacral region, focal tenderness at the left SI joint and left greater trochanter.  Negative straight leg raise, positive Faber, negative FADIR, positive Kemps test localizing to the left, provocative testing otherwise benign.  Findings are consistent with severe flare of lumbosacral spondylosis with focality to the left SI joint and left greater trochanter which can be considered secondary/compensatory in nature.  Plan for prednisone  course, Celebrex , gabapentin , as needed Robaxin , close follow-up with low threshold to advance to SI joint and left greater trochanter corticosteroid injections.  Once appropriate, referral to PT and recalcitrant symptoms can be referred to pain and spine for review of management options.

## 2022-01-26 NOTE — ED Triage Notes (Signed)
Pt comes with lower sided back pain that started yesterday. Pt states some pressure with urination.

## 2022-01-29 LAB — URINE CULTURE

## 2022-02-01 ENCOUNTER — Inpatient Hospital Stay: Payer: Self-pay | Admitting: Radiology

## 2022-02-01 ENCOUNTER — Encounter: Payer: Self-pay | Admitting: Family Medicine

## 2022-02-01 ENCOUNTER — Ambulatory Visit: Payer: Medicaid Other | Admitting: Family Medicine

## 2022-02-01 VITALS — BP 128/84 | HR 90 | Ht 67.0 in | Wt 212.0 lb

## 2022-02-01 DIAGNOSIS — M47817 Spondylosis without myelopathy or radiculopathy, lumbosacral region: Secondary | ICD-10-CM

## 2022-02-01 DIAGNOSIS — M25552 Pain in left hip: Secondary | ICD-10-CM | POA: Insufficient documentation

## 2022-02-01 DIAGNOSIS — N3090 Cystitis, unspecified without hematuria: Secondary | ICD-10-CM | POA: Diagnosis not present

## 2022-02-01 DIAGNOSIS — M47818 Spondylosis without myelopathy or radiculopathy, sacral and sacrococcygeal region: Secondary | ICD-10-CM | POA: Diagnosis not present

## 2022-02-01 MED ORDER — GABAPENTIN 300 MG PO CAPS: 300.0000 mg | ORAL_CAPSULE | Freq: Every day | ORAL | 0 refills | Status: DC

## 2022-02-01 MED ORDER — TRIAMCINOLONE ACETONIDE 40 MG/ML IJ SUSP: 40.0000 mg | Freq: Once | INTRAMUSCULAR | Status: DC

## 2022-02-01 MED ORDER — CELECOXIB 100 MG PO CAPS: 100.0000 mg | ORAL_CAPSULE | Freq: Two times a day (BID) | ORAL | 0 refills | Status: DC

## 2022-02-01 MED ORDER — PREDNISONE 50 MG PO TABS: 50.0000 mg | ORAL_TABLET | Freq: Every day | ORAL | 0 refills | Status: DC

## 2022-02-01 MED ORDER — FLUCONAZOLE 150 MG PO TABS: 150.0000 mg | ORAL_TABLET | Freq: Once | ORAL | 1 refills | Status: AC

## 2022-02-01 MED ORDER — NITROFURANTOIN MONOHYD MACRO 100 MG PO CAPS: 100.0000 mg | ORAL_CAPSULE | Freq: Two times a day (BID) | ORAL | 0 refills | Status: DC

## 2022-02-01 MED ORDER — TRIAMCINOLONE ACETONIDE 40 MG/ML IJ SUSP
80.0000 mg | Freq: Once | INTRAMUSCULAR | Status: AC
  Administered 2022-02-01: 80 mg via INTRAMUSCULAR

## 2022-02-01 NOTE — Assessment & Plan Note (Addendum)
Chronic underlying primary etiology, medication management reviewed with patient for this issue.  I encouraged patient to follow-up with pain and spine group for optimization of this issue.

## 2022-02-01 NOTE — Patient Instructions (Addendum)
You have just been given a cortisone injection to reduce pain and inflammation. After the injection you may notice immediate relief of pain as a result of the Lidocaine. It is important to rest the area of the injection for 24 to 48 hours after the injection. There is a possibility of some temporary increased discomfort and swelling for up to 72 hours until the cortisone begins to work. If you do have pain, simply rest the joint and use ice. If you can tolerate over the counter medications, you can try Tylenol, Aleve, or Advil for added relief per package instructions. - As above, relative rest x 2 days and gradual return to normal activity - Dose Celebrex twice daily with food x 1 week then as needed - Start gabapentin, follow dosing instructions on Rx - If symptoms persist, start prednisone 5-day course - Follow-up as needed  For urinary symptoms take antibiotics and use diflucan if any yeast noted

## 2022-02-01 NOTE — Assessment & Plan Note (Signed)
>>  ASSESSMENT AND PLAN FOR SPONDYLOSIS OF LUMBOSACRAL REGION WITHOUT MYELOPATHY OR RADICULOPATHY WRITTEN ON 02/01/2022 12:04 PM BY Alexiya Franqui J, MD  Chronic underlying primary etiology, medication management reviewed with patient for this issue.  I encouraged patient to follow-up with pain and spine group for optimization of this issue.

## 2022-02-01 NOTE — Assessment & Plan Note (Signed)
Acute on chronic concern in the setting of lumbosacral spondylosis.  Unfortunate, patient was unable to initiate medications written at last visit due to inadvertent disposal of the medications after picking them up from the pharmacy.  She still has focal tenderness at the left SI joint and upcoming trip to [employer].  We reviewed treatment strategies and she did elect to proceed with ultrasound-guided SI joint injection, post care reviewed, note with accommodations provided, and medication management discussed with patient given the updated timeline.

## 2022-02-01 NOTE — Assessment & Plan Note (Signed)
Patient with previously noted leukocytes in urine, did not get a chance to start antibiotics, represcribed.

## 2022-02-01 NOTE — Assessment & Plan Note (Signed)
In the setting of lumbosacral spondylosis, focal symptoms to the left greater trochanteric region.  Treatments reviewed and she did elect to proceed with ultrasound-guided corticosteroid injection.  Post care reviewed, medication management performed.

## 2022-02-01 NOTE — Progress Notes (Signed)
Primary Care / Sports Medicine Office Visit  Patient Information:  Patient ID: Dawn Hunter, female DOB: 15-Feb-1980 Age: 42 y.o. MRN: [id number]   Dawn Hunter is a pleasant 42 y.o. female presenting with the following:  Chief Complaint  Patient presents with   Procedure    Left side back   Urinary Tract Infection    Has not been able to take medicaiton, thinks son threw away    Vitals:   02/01/22 0859  BP: 128/84  Pulse: 90  SpO2: 99%   Vitals:   02/01/22 0859  Weight: 212 lb (96.2 kg)  Height: '5\' 7"'$  (1.702 m)   Body mass index is 33.2 kg/m.     Independent interpretation of notes and tests performed by another provider:   None  Procedures performed:   Procedure:  Injection of left greater trochanter under ultrasound guidance. Ultrasound guidance utilized for out of plane approach to left greater trochanter, hypoechoic shadow consistent with bursitis noted Samsung HS60 device utilized with permanent recording / reporting. Verbal informed consent obtained and verified. Skin prepped in a sterile fashion. Ethyl chloride for topical local analgesia.  Completed without difficulty and tolerated well. Medication: triamcinolone acetonide 40 mg/mL suspension for injection 1 mL total and 2 mL lidocaine 1% without epinephrine utilized for needle placement anesthetic Advised to contact for fevers/chills, erythema, induration, drainage, or persistent bleeding.  Procedure:  Injection of left sacroiliac joint under ultrasound guidance. Ultrasound guidance utilized for in-plane approach, medial to lateral, no discrete sonographic abnormalities noted Samsung HS60 device utilized with permanent recording / reporting. Verbal informed consent obtained and verified. Skin prepped in a sterile fashion. Ethyl chloride for topical local analgesia.  Completed without difficulty and tolerated well. Medication: triamcinolone acetonide 40 mg/mL suspension for injection 1 mL total  and 2 mL lidocaine 1% without epinephrine utilized for needle placement anesthetic Advised to contact for fevers/chills, erythema, induration, drainage, or persistent bleeding.   Pertinent History, Exam, Impression, and Recommendations:   Dawn Hunter was seen today for procedure and urinary tract infection.  Spondylosis of lumbosacral region without myelopathy or radiculopathy Assessment & Plan: Chronic underlying primary etiology, medication management reviewed with patient for this issue.  I encouraged patient to follow-up with pain and spine group for optimization of this issue.  Orders: -     Gabapentin; Take 1 capsule (300 mg total) by mouth at bedtime. One tab PO qHS for a week, then BID for a week, then TID. May double weekly to a max of 3,'600mg'$ /day  Dispense: 30 capsule; Refill: 0 -     Celecoxib; Take 1 capsule (100 mg total) by mouth 2 (two) times daily.  Dispense: 60 capsule; Refill: 0 -     predniSONE; Take 1 tablet (50 mg total) by mouth daily.  Dispense: 5 tablet; Refill: 0 -     Triamcinolone Acetonide  Arthropathy of left sacroiliac joint Assessment & Plan: Acute on chronic concern in the setting of lumbosacral spondylosis.  Unfortunate, patient was unable to initiate medications written at last visit due to inadvertent disposal of the medications after picking them up from the pharmacy.  She still has focal tenderness at the left SI joint and upcoming trip to [employer].  We reviewed treatment strategies and she did elect to proceed with ultrasound-guided SI joint injection, post care reviewed, note with accommodations provided, and medication management discussed with patient given the updated timeline.  Orders: -     [place] LIMITED JOINT SPACE STRUCTURES LOW LEFT; Future -  Triamcinolone Acetonide  Greater trochanteric pain syndrome of left lower extremity Assessment & Plan: In the setting of lumbosacral spondylosis, focal symptoms to the left greater trochanteric region.   Treatments reviewed and she did elect to proceed with ultrasound-guided corticosteroid injection.  Post care reviewed, medication management performed.  Orders: -     [place] LIMITED JOINT SPACE STRUCTURES LOW LEFT; Future -     Triamcinolone Acetonide  Cystitis Assessment & Plan: Patient with previously noted leukocytes in urine, did not get a chance to start antibiotics, represcribed.  Orders: -     Nitrofurantoin Monohyd Macro; Take 1 capsule (100 mg total) by mouth 2 (two) times daily.  Dispense: 14 capsule; Refill: 0  Other orders -     Fluconazole; Take 1 tablet (150 mg total) by mouth once for 1 dose.  Dispense: 1 tablet; Refill: 1     Orders & Medications Meds ordered this encounter  Medications   nitrofurantoin, macrocrystal-monohydrate, (MACROBID) 100 MG capsule    Sig: Take 1 capsule (100 mg total) by mouth 2 (two) times daily.    Dispense:  14 capsule    Refill:  0   gabapentin (NEURONTIN) 300 MG capsule    Sig: Take 1 capsule (300 mg total) by mouth at bedtime. One tab PO qHS for a week, then BID for a week, then TID. May double weekly to a max of 3,'600mg'$ /day    Dispense:  30 capsule    Refill:  0   celecoxib (CELEBREX) 100 MG capsule    Sig: Take 1 capsule (100 mg total) by mouth 2 (two) times daily.    Dispense:  60 capsule    Refill:  0   predniSONE (DELTASONE) 50 MG tablet    Sig: Take 1 tablet (50 mg total) by mouth daily.    Dispense:  5 tablet    Refill:  0   fluconazole (DIFLUCAN) 150 MG tablet    Sig: Take 1 tablet (150 mg total) by mouth once for 1 dose.    Dispense:  1 tablet    Refill:  1   DISCONTD: triamcinolone acetonide (KENALOG-40) injection 40 mg   triamcinolone acetonide (KENALOG-40) injection 80 mg   Orders Placed This Encounter  Procedures   [place] LIMITED JOINT SPACE STRUCTURES LOW LEFT     Return if symptoms worsen or fail to improve.     Montel Culver, MD, [facility]   Primary Care Sports Medicine Primary Care and Sports Medicine at  [facility]

## 2022-02-11 ENCOUNTER — Other Ambulatory Visit: Payer: Self-pay | Admitting: Family Medicine

## 2022-02-11 DIAGNOSIS — M47817 Spondylosis without myelopathy or radiculopathy, lumbosacral region: Secondary | ICD-10-CM

## 2022-02-12 NOTE — Telephone Encounter (Signed)
Requested medication (s) are due for refill today:   Being titrated up  Requested medication (s) are on the active medication list:   Yes  Future visit scheduled:   Yes in 3 wks with Dr. Zigmund Daniel   Last ordered: 02/01/2022 #30, 0 refills  Returned per protocol since it is being titrated upward.     Requested Prescriptions  Pending Prescriptions Disp Refills   gabapentin (NEURONTIN) 300 MG capsule [Pharmacy Med Name: GABAPENTIN '300MG'$  CAPSULES] 30 capsule 0    Sig: 1 CAPSULE BY MOUTH BEDTIME FOR A WEEK THEN TWICE DAILY FOR A WEEK THEN THREE TIMES DAILY MAY DOUBLE WEEKLY TO MAX OF '3600MG'$ /DAY(12 [place] A     Neurology: Anticonvulsants - gabapentin Passed - 02/11/2022 10:12 AM      Passed - Cr in normal range and within 360 days    Creatinine, Ser  Date Value Ref Range Status  06/01/2021 0.68 0.57 - 1.00 mg/dL Final         Passed - Completed PHQ-2 or PHQ-9 in the last 360 days      Passed - Valid encounter within last 12 months    Recent Outpatient Visits           1 week ago Spondylosis of lumbosacral region without myelopathy or radiculopathy   [place] [facility] at [place], Earley Abide, MD   2 weeks ago Spondylosis of lumbosacral region without myelopathy or radiculopathy   [place] [facility] at [place], Earley Abide, MD   5 months ago Axillary mass, bilateral   [place] [facility] at [place], Earley Abide, MD   8 months ago Annual physical exam   [facility] and Sports Medicine at [facility], Earley Abide, MD   9 months ago Hypertension, unspecified type   [facility] Primary Care and Sports Medicine at [facility], Earley Abide, MD       Future Appointments             In 3 weeks Zigmund Daniel, Earley Abide, MD [place] Primary [facility] and Sports Medicine at [facility], [facility] [address]

## 2022-02-23 ENCOUNTER — Other Ambulatory Visit: Payer: Self-pay | Admitting: Family Medicine

## 2022-02-23 DIAGNOSIS — M47817 Spondylosis without myelopathy or radiculopathy, lumbosacral region: Secondary | ICD-10-CM

## 2022-02-23 NOTE — Telephone Encounter (Signed)
Requested Prescriptions  Pending Prescriptions Disp Refills   celecoxib (CELEBREX) 100 MG capsule [Pharmacy Med Name: CELECOXIB '100MG'$  CAPSULES] 60 capsule 0    Sig: TAKE 1 CAPSULE(100 MG) BY MOUTH TWICE DAILY     Analgesics:  COX2 Inhibitors Failed - 02/23/2022  3:32 AM      Failed - Manual Review: Labs are only required if the patient has taken medication for more than 8 weeks.      Passed - HGB in normal range and within 360 days    Hemoglobin  Date Value Ref Range Status  06/01/2021 13.3 11.1 - 15.9 g/dL Final         Passed - Cr in normal range and within 360 days    Creatinine, Ser  Date Value Ref Range Status  06/01/2021 0.68 0.57 - 1.00 mg/dL Final         Passed - HCT in normal range and within 360 days    Hematocrit  Date Value Ref Range Status  06/01/2021 38.1 34.0 - 46.6 % Final         Passed - AST in normal range and within 360 days    AST  Date Value Ref Range Status  06/01/2021 16 0 - 40 IU/L Final         Passed - ALT in normal range and within 360 days    ALT  Date Value Ref Range Status  06/01/2021 15 0 - 32 IU/L Final         Passed - eGFR is 30 or above and within 360 days    GFR, Estimated  Date Value Ref Range Status  01/25/2021 >60 >60 mL/min Final    Comment:    (NOTE) Calculated using the CKD-EPI Creatinine Equation (2021)    eGFR  Date Value Ref Range Status  06/01/2021 113 >59 mL/min/1.73 Final         Passed - Patient is not pregnant      Passed - Valid encounter within last 12 months    Recent Outpatient Visits           3 weeks ago Spondylosis of lumbosacral region without myelopathy or radiculopathy   [place] [facility] at [place], Earley Abide, MD   4 weeks ago Spondylosis of lumbosacral region without myelopathy or radiculopathy   [place] [facility] [place] [facility] [place], Earley Abide, MD   5 months ago Axillary mass, bilateral   [facility] [place] at [place], Earley Abide, MD   8 months ago Annual physical exam   [place] [facility] [place], Earley Abide, MD   9 months ago Hypertension, unspecified type   [facility] [place] at [facility], Earley Abide, MD       Future Appointments             In 1 week Zigmund Daniel, Earley Abide, MD [place] at [facility], [facility]

## 2022-03-01 ENCOUNTER — Telehealth: Payer: Self-pay

## 2022-03-01 ENCOUNTER — Ambulatory Visit: Payer: Medicaid Other | Admitting: Family Medicine

## 2022-03-01 ENCOUNTER — Ambulatory Visit: Payer: Self-pay

## 2022-03-01 ENCOUNTER — Other Ambulatory Visit: Payer: Self-pay

## 2022-03-01 DIAGNOSIS — N939 Abnormal uterine and vaginal bleeding, unspecified: Secondary | ICD-10-CM

## 2022-03-01 NOTE — Telephone Encounter (Signed)
  Chief Complaint: Heavy vaginal bleeding Symptoms: Abdominal pain Frequency: Since January 20th Pertinent Negatives: Patient denies fever Disposition: '[]'$ ED /'[]'$ Urgent Care (no appt availability in office) / '[x]'$ Appointment(In office/virtual)/ '[]'$  [place] [facility]/ '[]'$ Home Care/ '[]'$ Refused Recommended Disposition /'[]'$ [place] [facility]/ '[]'$  Follow-up with PCP Additional Notes: Pt states that she has heavy vaginal bleeding since Jan20. Pt thinks this may be fibroids. She had them before with similar s/s.  Reason for Disposition  MODERATE vaginal bleeding (e.g., soaking 1 pad or tampon per hour and present > 6 hours; 1 menstrual cup every 6 hours)  Answer Assessment - Initial Assessment Questions 1. AMOUNT: "Describe the bleeding that you are having."    - SPOTTING: spotting, or pinkish / brownish mucous discharge; does not fill panty liner or pad    - MILD:  less than 1 pad / hour; less than patient's usual menstrual bleeding   - MODERATE: 1-2 pads / hour; 1 menstrual cup every 6 hours; small-medium blood clots (e.g., pea, grape, small coin)   - SEVERE: soaking 2 or more pads/hour for 2 or more hours; 1 menstrual cup every 2 hours; bleeding not contained by pads or continuous red blood from vagina; large blood clots (e.g., golf ball, large coin)      January 20th - severe 2. ONSET: "When did the bleeding begin?" "Is it continuing now?"     January 20th 3. MENSTRUAL PERIOD: "When was the last normal menstrual period?" "How is this different than your period?"     Heavy - clots 4. REGULARITY: "How regular are your periods?"     yes 5. ABDOMEN PAIN: "Do you have any pain?" "How bad is the pain?"  (e.g., Scale 1-10; mild, moderate, or severe)   - MILD (1-3): doesn't interfere with normal activities, abdomen soft and not tender to touch    - MODERATE (4-7): interferes with normal activities or awakens from sleep, abdomen tender to touch    - SEVERE (8-10): excruciating pain, doubled over,  unable to do any normal activities      Soreness 6. PREGNANCY: "Is there any chance you are pregnant?" "When was your last menstrual period?"      7. BREASTFEEDING: "Are you breastfeeding?"      8. HORMONE MEDICINES: "Are you taking any hormone medicines, prescription or over-the-counter?" (e.g., birth control pills, estrogen)     no 9. BLOOD THINNER MEDICINES: "Do you take any blood thinners?" (e.g., Coumadin / warfarin, Pradaxa / dabigatran, aspirin)     no 10. CAUSE: "What do you think is causing the bleeding?" (e.g., recent gyn surgery, recent gyn procedure; known bleeding disorder, cervical cancer, polycystic ovarian disease, fibroids)         Fibroids 11. HEMODYNAMIC STATUS: "Are you weak or feeling lightheaded?" If Yes, ask: "Can you stand and walk normally?"        Yesterday weak - lightheaded 12. OTHER SYMPTOMS: "What other symptoms are you having with the bleeding?" (e.g., passed tissue, vaginal discharge, fever, menstrual-type cramps)  Protocols used: Vaginal Bleeding - Abnormal-A-AH

## 2022-03-01 NOTE — Telephone Encounter (Signed)
Spoke to pt- she is having heavy bleeding with history of fibroids. She does not have a GYN in this area. She is going to ER for further eval/ possible [place] and blood work. I will place referral to GYN

## 2022-03-01 NOTE — Progress Notes (Signed)
Ref placed GYN

## 2022-03-02 ENCOUNTER — Encounter: Payer: Self-pay | Admitting: Emergency Medicine

## 2022-03-02 ENCOUNTER — Other Ambulatory Visit: Payer: Self-pay

## 2022-03-02 ENCOUNTER — Telehealth: Payer: Self-pay

## 2022-03-02 ENCOUNTER — Emergency Department
Admission: EM | Admit: 2022-03-02 | Discharge: 2022-03-02 | Disposition: A | Discharge status: Home / Self Care | Payer: Medicaid Other | Attending: Emergency Medicine | Admitting: Emergency Medicine

## 2022-03-02 ENCOUNTER — Emergency Department: Payer: Medicaid Other

## 2022-03-02 DIAGNOSIS — N760 Acute vaginitis: Secondary | ICD-10-CM | POA: Diagnosis not present

## 2022-03-02 DIAGNOSIS — B9689 Other specified bacterial agents as the cause of diseases classified elsewhere: Secondary | ICD-10-CM

## 2022-03-02 DIAGNOSIS — N83202 Unspecified ovarian cyst, left side: Secondary | ICD-10-CM | POA: Diagnosis not present

## 2022-03-02 DIAGNOSIS — N939 Abnormal uterine and vaginal bleeding, unspecified: Secondary | ICD-10-CM | POA: Diagnosis not present

## 2022-03-02 LAB — CBC
HCT: 39.7 % (ref 36.0–46.0)
Hemoglobin: 13.8 g/dL (ref 12.0–15.0)
MCH: 32.2 pg (ref 26.0–34.0)
MCHC: 34.8 g/dL (ref 30.0–36.0)
MCV: 92.8 fL (ref 80.0–100.0)
Platelets: 295 10*3/uL (ref 150–400)
RBC: 4.28 MIL/uL (ref 3.87–5.11)
RDW: 12.8 % (ref 11.5–15.5)
WBC: 9.4 10*3/uL (ref 4.0–10.5)
nRBC: 0 % (ref 0.0–0.2)

## 2022-03-02 LAB — WET PREP, GENITAL
Sperm: NONE SEEN
Trich, Wet Prep: NONE SEEN
WBC, Wet Prep HPF POC: <10 — AB (ref <10)
Yeast Wet Prep HPF POC: NONE SEEN

## 2022-03-02 LAB — URINALYSIS, ROUTINE W REFLEX MICROSCOPIC
Bacteria, UA: NONE SEEN
Bilirubin Urine: NEGATIVE
Glucose, UA: NEGATIVE mg/dL
Ketones, ur: NEGATIVE mg/dL
Leukocytes,Ua: NEGATIVE
Nitrite: NEGATIVE
Protein, ur: 30 mg/dL — AB
Specific Gravity, Urine: 1.025 (ref 1.005–1.030)
pH: 6 (ref 5.0–8.0)

## 2022-03-02 LAB — BASIC METABOLIC PANEL
Anion gap: 8 (ref 5–15)
BUN: 16 mg/dL (ref 6–20)
CO2: 28 mmol/L (ref 22–32)
Calcium: 9 mg/dL (ref 8.9–10.3)
Chloride: 100 mmol/L (ref 98–111)
Creatinine, Ser: 0.95 mg/dL (ref 0.44–1.00)
GFR, Estimated: >60 mL/min (ref >60)
Glucose, Bld: 102 mg/dL — ABNORMAL HIGH (ref 70–99)
Potassium: 3.8 mmol/L (ref 3.5–5.1)
Sodium: 136 mmol/L (ref 135–145)

## 2022-03-02 LAB — POC URINE PREG, ED: Preg Test, Ur: NEGATIVE

## 2022-03-02 LAB — CHLAMYDIA/NGC RT PCR (ARMC ONLY)
Chlamydia Tr: NOT DETECTED
N gonorrhoeae: NOT DETECTED

## 2022-03-02 MED ORDER — METRONIDAZOLE 500 MG PO TABS
500.0000 mg | ORAL_TABLET | Freq: Once | ORAL | Status: AC
  Administered 2022-03-02: 500 mg via ORAL
  Filled 2022-03-02: qty 1

## 2022-03-02 MED ORDER — METRONIDAZOLE 500 MG PO TABS: 500.0000 mg | ORAL_TABLET | Freq: Two times a day (BID) | ORAL | 0 refills | Status: AC

## 2022-03-02 NOTE — Telephone Encounter (Signed)
Opened to see if pt. Went to ER for heavy bleeding/ she is going to see GYN at upcoming appt

## 2022-03-02 NOTE — ED Provider Notes (Signed)
Patient's pelvic ultrasound reviewed by me shows subserosal 4 cm left-sided uterine fibroid with complex left ovarian cyst measuring 2.8 cm.  Radiologist recommended 6-week ultrasound follow-up.  Discussed results with patient and need for follow-up.  She has a follow-up appointment in April with GYN.  We discussed bleeding and signs and symptoms return to the ER for.  We also discussed patient's wet prep results showing clue cells.  She is given a dose of metronidazole here in the emergency department and placed on a 7-day prescription for metronidazole.  She understands signs and symptoms return to the ER for.  Patient's vital signs are stable.  She appears well in no distress.  Patient stable and ready for discharge to home     Dawn Hunter 03/02/22 2033    Vanessa [place], MD 03/12/22 [phone]

## 2022-03-02 NOTE — ED Triage Notes (Signed)
Patient to ED for vaginal bleeding since 1/20. C/o of lower abd cramping on left side. Patient states also an increased urination. Going through 2 pads an hour.

## 2022-03-02 NOTE — Discharge Instructions (Addendum)
Please take antibiotics as prescribed and follow-up with GYN physician.  Return to the ER for any pain, dizziness, lightheadedness, worsening vaginal bleeding or any urgent changes in your health

## 2022-03-02 NOTE — ED Provider Notes (Incomplete)
   [facility] Provider Note    Event Date/Time   First MD Initiated Contact with Patient 03/02/22 1812     (approximate)   History   Vaginal Bleeding   HPI  Dawn Hunter is a 42 y.o. female with history of hypertension and as listed in EMR presents to the emergency department for treatment and evaluation of vaginal bleeding since 02/17/22.  She is also having some left lower quadrant pain.  No vomiting      Physical Exam   Triage Vital Signs: ED Triage Vitals  Enc Vitals Group     BP 03/02/22 1807 (!) 155/97     Pulse Rate 03/02/22 1807 (!) 101     Resp 03/02/22 1807 18     Temp 03/02/22 1807 98 F (36.7 C)     Temp Source 03/02/22 1807 Oral     SpO2 03/02/22 1807 99 %     Weight --      Height --      Head Circumference --      Peak Flow --      Pain Score 03/02/22 1808 6     Pain Loc --      Pain Edu? --      Excl. in [place]? --     Most recent vital signs: Vitals:   03/02/22 1807  BP: (!) 155/97  Pulse: (!) 101  Resp: 18  Temp: 98 F (36.7 C)  SpO2: 99%    General: Awake, no distress.  CV:  Good peripheral perfusion.  Resp:  Normal effort.  Abd:  No distention.  Other:  Pelvic exam shows scant amount of blood in the vaginal vault.  Cervix is closed   ED Results / Procedures / Treatments   Labs (all labs ordered are listed, but only abnormal results are displayed) Labs Reviewed  CBC  BASIC METABOLIC PANEL  POC URINE PREG, ED     EKG  Not indicated   RADIOLOGY  Image and radiology report reviewed and interpreted by me. Radiology report consistent with the same.  Pending  PROCEDURES:  Critical Care performed: No  Procedures   MEDICATIONS ORDERED IN ED:  Medications - No data to display   IMPRESSION / MDM / [place] / ED COURSE   I have reviewed the triage note.  Differential diagnosis includes, but is not limited to, ovarian cyst, fibroids, STI, hormone changes, pregnancy/missed  abortion  Patient's presentation is most consistent with acute illness / injury with system symptoms.  42 year old female presenting to the emergency department for treatment and evaluation of abnormal vaginal bleeding.  See HPI for further details.  Pelvic exam is unremarkable with the exception of a scant amount of bleeding in the vaginal vault.  Lab studies are reassuring.  No indication of anemia.  Chlamydia wet prep, urinalysis, and ultrasound are pending.      FINAL CLINICAL IMPRESSION(S) / ED DIAGNOSES   Final diagnoses:  None     Rx / DC Orders   ED Discharge Orders     None        Note:  This document was prepared using Dragon voice recognition software and may include unintentional dictation errors.

## 2022-03-05 ENCOUNTER — Encounter: Payer: Self-pay | Admitting: Family Medicine

## 2022-03-05 ENCOUNTER — Telehealth: Payer: Self-pay

## 2022-03-05 ENCOUNTER — Ambulatory Visit: Payer: Medicaid Other | Admitting: Family Medicine

## 2022-03-05 VITALS — BP 130/86 | HR 88 | Ht 67.0 in | Wt 218.0 lb

## 2022-03-05 DIAGNOSIS — M47817 Spondylosis without myelopathy or radiculopathy, lumbosacral region: Secondary | ICD-10-CM

## 2022-03-05 DIAGNOSIS — R2233 Localized swelling, mass and lump, upper limb, bilateral: Secondary | ICD-10-CM | POA: Diagnosis not present

## 2022-03-05 MED ORDER — GABAPENTIN 300 MG PO CAPS: 300.0000 mg | ORAL_CAPSULE | Freq: Three times a day (TID) | ORAL | 1 refills | Status: DC

## 2022-03-05 MED ORDER — CELECOXIB 100 MG PO CAPS: 100.0000 mg | ORAL_CAPSULE | Freq: Two times a day (BID) | ORAL | 1 refills | Status: DC | PRN

## 2022-03-05 MED ORDER — DULOXETINE HCL 30 MG PO CPEP: ORAL_CAPSULE | ORAL | 0 refills | Status: DC

## 2022-03-05 NOTE — Assessment & Plan Note (Addendum)
Chronic, ongoing symptoms. Did note relief following SI joint and trochanter injections from 02/01/22 but pain returned weeks later. Ongoing primarily right leg radiating symptoms reported today.  Plan for scheduled gabapentin, discontinue methocarbamol, start duloxetine with titration guidelines. I have advised limits to Celebrex. She is to also start PT (number provided) and contact [facility] Spine group Dr. Holley Raring for interventional options.

## 2022-03-05 NOTE — Progress Notes (Deleted)
Patient ID: Dawn Hunter, female   DOB: Jun 02, 1980, 42 y.o.   MRN: [id number]  Chief Complaint: Axillary issues  History of Present Illness Dawn Hunter is a 42 y.o. female with dermal issues involving bilateral axillae.  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  She is undergone extensive treatments and methodologies to try to help protect her skin from the various irritations, sloughing etc.  She has some chronic lingering deep axillary tenderness likely secondary to reactive lymphadenopathy from these issues.  She is seeing plastic surgery for breast reduction, however Medicaid will not cover this.  She does have a lot of neck and upper back issues that I thought would justify a reduction procedure. She reports having lost weight without remarkable resolution of her issues, however we have to admit that she has improved/made progress compared to what she describes in her past. She has utilized a number of different products and has recently seem to come about with complete clearance of her skin with good healing, no active drainage and no current inflammatory issues.  She does remain tender in the axillae, she apparently has lost the suspensory ligaments from her axilla causing a convexity adding more tissue between her arms and her breasts.  She denies any known breast issues, her last mammography was in October of last year, and we will proceed with obtaining her next screening mammogram.  She has a paternal cousin who has history of breast cancer.  She underwent menses at the age of 44.  She has been pregnant 6 times with 4 children.  Their current ages are 30, 73, 66 and 16.  She was 42 years old with her first pregnancy.  She denies ever nursing.  She denies any nipple discharge.  Past Medical History Past Medical History:  Diagnosis Date   Hypertension       Past Surgical History:  Procedure Laterality Date   CESAREAN SECTION      TUBAL LIGATION      No Known Allergies  Current Outpatient Medications  Medication Sig Dispense Refill   celecoxib (CELEBREX) 100 MG capsule Take 1 capsule (100 mg total) by mouth 2 (two) times daily as needed. 60 capsule 1   DULoxetine (CYMBALTA) 30 MG capsule Take 1 capsule (30 mg total) by mouth every evening for 7 days, THEN 2 capsules (60 mg total) every evening. 91 capsule 0   gabapentin (NEURONTIN) 300 MG capsule Take 1 capsule (300 mg total) by mouth 3 (three) times daily. 90 capsule 1   hydrochlorothiazide (HYDRODIURIL) 12.5 MG tablet Take 1 tablet (12.5 mg total) by mouth daily. 90 tablet 1   metroNIDAZOLE (FLAGYL) 500 MG tablet Take 1 tablet (500 mg total) by mouth 2 (two) times daily for 7 days. 14 tablet 0   No current facility-administered medications for this visit.    Family History Family History  Problem Relation Age of Onset   Hypertension Mother    Hypertension Father    Diabetes Father    Hypertension Maternal Grandmother    Diabetes Maternal Grandmother    Hypertension Maternal Grandfather    Hypertension Paternal Grandmother    Diabetes Paternal Grandmother    Cancer Paternal Grandmother        Throat   Hypertension Paternal Grandfather    Cancer Paternal Grandfather    Breast cancer Cousin    Hypertension Son       Social History Social History   Tobacco Use  Smoking status: Never   Smokeless tobacco: Never  Vaping Use   Vaping Use: Never used  Substance Use Topics   Alcohol use: Not Currently   Drug use: Not Currently        Review of Systems  Constitutional: Negative.   HENT: Negative.    Eyes: Negative.   Respiratory: Negative.    Cardiovascular: Negative.   Gastrointestinal: Negative.   Genitourinary: Negative.   Skin:  Positive for itching and rash.  Neurological:  Positive for tingling.  Psychiatric/Behavioral: Negative.        Physical Exam There were no vitals taken for this visit.    CONSTITUTIONAL: Well  developed, and nourished, appropriately responsive and aware without distress.   EYES: Sclera non-icteric.   EARS, NOSE, MOUTH AND THROAT:  The oropharynx is clear. Oral mucosa is pink and moist.     Hearing is intact to voice.  NECK: Trachea is midline, and there is no jugular venous distension.  LYMPH NODES:  Lymph nodes in the neck are not enlarged. RESPIRATORY:  Lungs are clear, and breath sounds are equal bilaterally. Normal respiratory effort without pathologic use of accessory muscles. CARDIOVASCULAR: Heart is regular in rate and rhythm. GI: The abdomen is soft, nontender, and nondistended. There were no palpable masses. I did not appreciate hepatosplenomegaly. There were normal bowel sounds. GU: Freda Munro is present as chaperone.  Both axilla show a convex shape which adds volume/soft tissue compression within her bilateral arms and chest wall.  She does have gigantomastia, currently without any evidence of edema involving the skin, no evidence of induration, no evidence of mass or dominant nodularity to warrant suspicion of any underlying breast neoplasia.  The skin in her axilla are intact and she does have some subjective tenderness in both.  Breast skin and sternal skin also are clear, as well as her inframammary areas as well.  I suspect she likely has some reactive lymphadenopathy present, contributing to the deep axillary tenderness. MUSCULOSKELETAL:  Symmetrical muscle tone appreciated in all four extremities.    SKIN: Skin turgor is normal. No pathologic skin lesions appreciated.  NEUROLOGIC:  Motor and sensation appear grossly normal.  Cranial nerves are grossly without defect. PSYCH:  Alert and oriented to person, place and time. Affect is appropriate for situation.  Data Reviewed I have personally reviewed what is currently available of the patient's imaging, recent labs and medical records.   Labs:     Latest Ref Rng & Units 03/02/2022    6:10 PM 06/01/2021   10:07 AM 01/25/2021    10:33 AM  CBC  WBC 4.0 - 10.5 K/uL 9.4  6.7  6.1   Hemoglobin 12.0 - 15.0 g/dL 13.8  13.3  13.2   Hematocrit 36.0 - 46.0 % 39.7  38.1  37.7   Platelets 150 - 400 K/uL 295  313  299       Latest Ref Rng & Units 03/02/2022    6:10 PM 06/01/2021   10:07 AM 01/25/2021   10:33 AM  CMP  Glucose 70 - 99 mg/dL 102  90  108   BUN 6 - 20 mg/dL '16  7  11   '$ Creatinine 0.44 - 1.00 mg/dL 0.95  0.68  0.70   Sodium 135 - 145 mmol/L 136  142  135   Potassium 3.5 - 5.1 mmol/L 3.8  4.1  3.8   Chloride 98 - 111 mmol/L 100  103  106   CO2 22 - 32 mmol/L 28  21  23   Calcium 8.9 - 10.3 mg/dL 9.0  9.0  8.7   Total Protein 6.0 - 8.5 g/dL  6.8    Total Bilirubin 0.0 - 1.2 mg/dL  0.4    Alkaline Phos 44 - 121 IU/L  73    AST 0 - 40 IU/L  16    ALT 0 - 32 IU/L  15        Imaging:  Within last 24 hrs: No results found.  Assessment    Skin and soft tissue issues of bilateral axillae, suspect mechanical and inflammatory in nature. Symptomatic bilateral gigantomastia. 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   Greater trochanteric pain syndrome of left lower extremity 02/01/2022   Spondylosis of lumbosacral region without myelopathy or radiculopathy 01/26/2022   Cystitis 01/26/2022   Lumbar spondylosis 10/19/2021   Lumbar degenerative disc disease 10/19/2021   Chronic pain syndrome 10/19/2021   Pain in axilla 09/14/2021   Axillary tenderness 09/14/2021   Axillary mass, bilateral 09/01/2021   Need for Tdap vaccination 09/01/2021   Tinea corporis 06/01/2021   Annual physical exam 06/01/2021   Symptomatic mammary hypertrophy 05/04/2021   Left lumbosacral radiculopathy 05/04/2021   Hypertension 05/04/2021    Plan    We will go ahead with screening evaluation scheduling for bilateral breasts and hopefully axillary evaluations. I do not have any particular recommendations to help her with her skin care as her skin is all intact at  present. Referral to [facility] breast/plastics for possible consideration of breast reduction.  Face-to-face time spent with the patient and accompanying care providers(if present) was 40 minutes, with more than 50% of the time spent counseling, educating, and coordinating care of the patient.    These notes generated with voice recognition software. I apologize for typographical errors.  Ronny Bacon M.D., FACS 03/05/2022, 4:39 PM

## 2022-03-05 NOTE — Telephone Encounter (Signed)
Transition Care Management Follow-up Telephone Call Date of discharge and from where: [facility] 03/03/22 How have you been since you were released from the hospital? same Any questions or concerns? No  Items Reviewed: Did the pt receive and understand the discharge instructions provided? Yes  Medications obtained and verified? Yes  Other? No  Any new allergies since your discharge? No  Dietary orders reviewed? Yes Do you have support at home? Yes   Home Care and Equipment/Supplies: Were home health services ordered? not applicable   Functional Questionnaire: (I = Independent and D = Dependent) ADLs: I  Bathing/Dressing- I  Meal Prep- I  Eating- I  Maintaining continence- I  Transferring/Ambulation- I  Managing Meds- I  Follow up appointments reviewed:  PCP Hospital f/u appt confirmed? Yes  Scheduled to see Zigmund Daniel on 03/05/2022 @ 9:40am. [facility] f/u appt confirmed? Yes  Scheduled to see [facility] on 03/19/2022 . Are transportation arrangements needed? No  If their condition worsens, is the pt aware to call PCP or go to the Emergency Dept.? Yes Was the patient provided with contact information for the PCP's office or ED? Yes Was to pt encouraged to call back with questions or concerns? Yes

## 2022-03-05 NOTE — Patient Instructions (Addendum)
-   Start 1 capsule Cymbalta (duloxetine) nightly x 1 week then increase to 2 capsules nightly - Take gabapentin three times daily scheduled - Use Celebrex twice daily as-needed - Stop methocarbamol - Reach out to Pain & Spine: [place] [facility] at [facility] at [phone] - Reach out to PT: [facility] Physical Therapy: [place]:  [place]: [phone] - General surgery group should contact you for scheduling, can contact them here: Call [phone] to make an appointment - Keep follow-up with GYN, talk about contraception as well - Return in 2 months

## 2022-03-05 NOTE — Assessment & Plan Note (Signed)
Initial concern for HS, referral to dermatology who advised general surgery referral and surgical excision options. Referral placed today to general surgery. Will follow peripherally.

## 2022-03-05 NOTE — Progress Notes (Signed)
     Primary Care / Sports Medicine Office Visit  Patient Information:  Patient ID: Dawn Hunter, female DOB: 1981-01-22 Age: 42 y.o. MRN: [id number]   Dawn Hunter is a pleasant 42 y.o. female presenting with the following:  Chief Complaint  Patient presents with   Hypertension    Vitals:   03/05/22 0923  BP: 130/86  Pulse: 88  SpO2: 98%   Vitals:   03/05/22 0923  Weight: 218 lb (98.9 kg)  Height: '5\' 7"'$  (1.702 m)   Body mass index is 34.14 kg/m.     Independent interpretation of notes and tests performed by another provider:   None  Procedures performed:   None  Pertinent History, Exam, Impression, and Recommendations:   Dawn Hunter was seen today for hypertension.  Spondylosis of lumbosacral region without myelopathy or radiculopathy Assessment & Plan: Chronic, ongoing symptoms. Did note relief following SI joint and trochanter injections from 02/01/22 but pain returned weeks later. Ongoing primarily right leg radiating symptoms reported today.  Plan for scheduled gabapentin, discontinue methocarbamol, start duloxetine with titration guidelines. I have advised limits to Celebrex. She is to also start PT (number provided) and contact Pain & Spine group Dr. Holley Raring for interventional options.  Orders: -     DULoxetine HCl; Take 1 capsule (30 mg total) by mouth every evening for 7 days, THEN 2 capsules (60 mg total) every evening.  Dispense: 91 capsule; Refill: 0 -     Gabapentin; Take 1 capsule (300 mg total) by mouth 3 (three) times daily.  Dispense: 90 capsule; Refill: 1 -     Celecoxib; Take 1 capsule (100 mg total) by mouth 2 (two) times daily as needed.  Dispense: 60 capsule; Refill: 1  Axillary mass, bilateral Assessment & Plan: Initial concern for HS, referral to dermatology who advised general surgery referral and surgical excision options. Referral placed today to general surgery. Will follow peripherally.  Orders: -     Ambulatory referral to General  Surgery  I provided a total time of 43 minutes including both face-to-face and non-face-to-face time on 03/05/2022 inclusive of time utilized for medical chart review, information gathering, care coordination with staff, and documentation completion.    Orders & Medications Meds ordered this encounter  Medications   DULoxetine (CYMBALTA) 30 MG capsule    Sig: Take 1 capsule (30 mg total) by mouth every evening for 7 days, THEN 2 capsules (60 mg total) every evening.    Dispense:  91 capsule    Refill:  0   gabapentin (NEURONTIN) 300 MG capsule    Sig: Take 1 capsule (300 mg total) by mouth 3 (three) times daily.    Dispense:  90 capsule    Refill:  1   celecoxib (CELEBREX) 100 MG capsule    Sig: Take 1 capsule (100 mg total) by mouth 2 (two) times daily as needed.    Dispense:  60 capsule    Refill:  1   Orders Placed This Encounter  Procedures   Ambulatory referral to General Surgery     Return in about 2 months (around 05/04/2022).     Montel Culver, MD, [facility]   Primary Care Sports Medicine Primary Care and Sports Medicine at [facility]

## 2022-03-05 NOTE — Assessment & Plan Note (Signed)
>>  ASSESSMENT AND PLAN FOR SPONDYLOSIS OF LUMBOSACRAL REGION WITHOUT MYELOPATHY OR RADICULOPATHY WRITTEN ON 03/05/2022 10:36 PM BY Sailor Hevia J, MD  Chronic, ongoing symptoms. Did note relief following SI joint and trochanter injections from 02/01/22 but pain returned weeks later. Ongoing primarily right leg radiating symptoms reported today.  Plan for scheduled gabapentin , discontinue methocarbamol , start duloxetine  with titration guidelines. I have advised limits to Celebrex . She is to also start PT (number provided) and contact Pain & Spine group Dr. Marcelino for interventional options.

## 2022-03-05 NOTE — Assessment & Plan Note (Signed)
>>  ASSESSMENT AND PLAN FOR AXILLARY MASS, BILATERAL WRITTEN ON 03/05/2022 10:38 PM BY Misti Towle, Ocie Bob, MD  Initial concern for HS, referral to dermatology who advised general surgery referral and surgical excision options. Referral placed today to general surgery. Will follow peripherally.

## 2022-03-06 ENCOUNTER — Ambulatory Visit: Payer: Medicaid Other | Admitting: Surgery

## 2022-03-09 ENCOUNTER — Telehealth: Payer: Self-pay | Admitting: Family Medicine

## 2022-03-09 NOTE — Telephone Encounter (Signed)
Copied from [place] [id number]. Topic: Referral - Status >> Mar 09, 2022  4:31 PM Ludger Nutting wrote: Patient called to get a status on the referral for a plastic surgeon. Please follow up with patient.

## 2022-03-12 NOTE — Telephone Encounter (Signed)
Called pt left her know it takes 7-10 business days for someone to call to schedule an appointment. Pt verbalized understanding.  KP

## 2022-03-19 ENCOUNTER — Encounter: Payer: Self-pay | Admitting: Obstetrics & Gynecology

## 2022-03-19 ENCOUNTER — Ambulatory Visit: Payer: Medicaid Other | Admitting: Obstetrics & Gynecology

## 2022-03-19 ENCOUNTER — Telehealth: Payer: Self-pay | Admitting: Surgery

## 2022-03-19 VITALS — BP 144/88 | HR 99 | Ht 67.0 in | Wt 222.0 lb

## 2022-03-19 DIAGNOSIS — N83202 Unspecified ovarian cyst, left side: Secondary | ICD-10-CM | POA: Diagnosis not present

## 2022-03-19 DIAGNOSIS — N938 Other specified abnormal uterine and vaginal bleeding: Secondary | ICD-10-CM

## 2022-03-19 DIAGNOSIS — Z0000 Encounter for general adult medical examination without abnormal findings: Secondary | ICD-10-CM

## 2022-03-19 DIAGNOSIS — Z1231 Encounter for screening mammogram for malignant neoplasm of breast: Secondary | ICD-10-CM

## 2022-03-19 NOTE — Progress Notes (Signed)
   Established Patient Office Visit  Subjective   Patient ID: Dawn Hunter, female    DOB: 09/08/1980  Age: 42 y.o. MRN: [id number]  Chief Complaint  Patient presents with   Menstrual Problem    Aub for 3 1/2 weeks    Pelvic Pain   Dyspareunia    HPI   42 yo married P4 here as a new patient with h/o DUB. She normally has monthly periods. However, the started her period January 19 and bled essentially for an entire month, just stopped yesterday. She had a BTL about 12 years ago. She also has some LLQ pain with sex. She had an ultrasound 2/2 that showed the following: IMPRESSION: 1. Subserosal 4 cm left-sided uterine fibroid. 2. Complex cyst left ovary measuring 2.8 cm. Six week ultrasound follow-up recommended to confirm resolution or stability as a precaution.   Objective:     BP (!) 144/88   Pulse 99   Ht 5' 7"$  (1.702 m)   Wt 222 lb (100.7 kg)   LMP 02/17/2022   BMI 34.77 kg/m    Physical Exam   Well nourished, well hydrated Black female, no apparent distress EG- normal Spec exam reveals a small cervical os, no abnormalities  Normal female without lesion, discharge or tenderness, normal size and shape, anteverted, normal adnexal exam  Assessment & Plan:   Problem List Items Addressed This Visit   None Visit Diagnoses     Left ovarian cyst    -  Primary   Relevant Orders   US PELVIC COMPLETE WITH TRANSVAGINAL      I will check TSH, CBC today.  If the bleeding recurs, then I would recommend cyclic provera for several months She will get a repeat gyn ultrasound in about 6 weeks to follow up on the left ovarian cyst Mammogram ordered   Emily Filbert, MD

## 2022-03-19 NOTE — Telephone Encounter (Signed)
error 

## 2022-03-20 LAB — TSH+FREE T4
Free T4: 1.06 ng/dL (ref 0.82–1.77)
TSH: 1.81 u[IU]/mL (ref 0.450–4.500)

## 2022-03-20 LAB — CBC
Hematocrit: 41.5 % (ref 34.0–46.6)
Hemoglobin: 14.6 g/dL (ref 11.1–15.9)
MCH: 33.2 pg — ABNORMAL HIGH (ref 26.6–33.0)
MCHC: 35.2 g/dL (ref 31.5–35.7)
MCV: 94 fL (ref 79–97)
Platelets: 380 10*3/uL (ref 150–450)
RBC: 4.4 x10E6/uL (ref 3.77–5.28)
RDW: 12.8 % (ref 11.7–15.4)
WBC: 8.2 10*3/uL (ref 3.4–10.8)

## 2022-03-28 ENCOUNTER — Telehealth: Payer: Self-pay | Admitting: Obstetrics & Gynecology

## 2022-03-28 NOTE — Telephone Encounter (Signed)
Called to schedule patient for ultrasound appointment. Patient states she never heard anything back from the provider about placing a referral to a surgical center for the "knots" on her legs and underarms. Please advise?

## 2022-04-26 ENCOUNTER — Other Ambulatory Visit: Payer: Self-pay | Admitting: Family Medicine

## 2022-04-26 DIAGNOSIS — M47817 Spondylosis without myelopathy or radiculopathy, lumbosacral region: Secondary | ICD-10-CM

## 2022-04-26 NOTE — Telephone Encounter (Signed)
Requested medication (s) are due for refill today: For review  Requested medication (s) are on the active medication list: yes    Last refill: 03/05/22  #91  0 refills  Future visit scheduled yes 05/11/22  Notes to clinic:Titrated dose, please review. Thank you.  Requested Prescriptions  Pending Prescriptions Disp Refills   DULoxetine (CYMBALTA) 30 MG capsule [Pharmacy Med Name: DULOXETINE DR 30MG  CAPSULES] 91 capsule 0    Sig: TAKE 1 CAPSULE(30 MG) BY MOUTH EVERY EVENING FOR 7 DAYS THEN TAKE 2 CAPSULES(60 MG) BY MOUTH EVERY EVENING     Psychiatry: Antidepressants - SNRI - duloxetine Failed - 04/26/2022  3:32 AM      Failed - Last BP in normal range    BP Readings from Last 1 Encounters:  03/19/22 (!) 144/88         Passed - Cr in normal range and within 360 days    Creatinine, Ser  Date Value Ref Range Status  03/02/2022 0.95 0.44 - 1.00 mg/dL Final         Passed - eGFR is 30 or above and within 360 days    GFR, Estimated  Date Value Ref Range Status  03/02/2022 >60 >60 mL/min Final    Comment:    (NOTE) Calculated using the CKD-EPI Creatinine Equation (2021)    eGFR  Date Value Ref Range Status  06/01/2021 113 >59 mL/min/1.73 Final         Passed - Completed PHQ-2 or PHQ-9 in the last 360 days      Passed - Valid encounter within last 6 months    Recent Outpatient Visits           1 month ago Spondylosis of lumbosacral region without myelopathy or radiculopathy   [place] [facility] [place], Earley Abide, MD   2 months ago Spondylosis of lumbosacral region without myelopathy or radiculopathy   [place] [facility] [place] [facility] [place], Earley Abide, MD   3 months ago Spondylosis of lumbosacral region without myelopathy or radiculopathy    [facility] [place] [facility] [place], Earley Abide, MD   7 months ago Axillary mass, bilateral   [facility] [place] [facility] [place], Earley Abide, MD   10 months ago Annual physical exam   [place] [facility], Earley Abide, MD       Future Appointments             In 2 weeks Zigmund Daniel, Earley Abide, MD [place] at [facility], [facility]

## 2022-04-27 ENCOUNTER — Encounter: Payer: Self-pay | Admitting: Physician Assistant

## 2022-04-27 ENCOUNTER — Ambulatory Visit: Payer: Self-pay

## 2022-04-27 ENCOUNTER — Ambulatory Visit: Payer: Medicaid Other | Admitting: Physician Assistant

## 2022-04-27 VITALS — BP 128/78 | HR 77 | Temp 98.4°F | Ht 67.0 in | Wt 226.0 lb

## 2022-04-27 DIAGNOSIS — L272 Dermatitis due to ingested food: Secondary | ICD-10-CM | POA: Diagnosis not present

## 2022-04-27 DIAGNOSIS — I10 Essential (primary) hypertension: Secondary | ICD-10-CM | POA: Diagnosis not present

## 2022-04-27 MED ORDER — HYDROCHLOROTHIAZIDE 12.5 MG PO TABS: 12.5000 mg | ORAL_TABLET | Freq: Every day | ORAL | 0 refills | Status: DC

## 2022-04-27 MED ORDER — TRIAMCINOLONE ACETONIDE 40 MG/ML IJ SUSP
40.0000 mg | Freq: Once | INTRAMUSCULAR | Status: AC
  Administered 2022-04-27: 40 mg via INTRAMUSCULAR

## 2022-04-27 NOTE — Telephone Encounter (Signed)
  Chief Complaint: allergic reaction Symptoms: face itching, rash on face and chin Frequency: yesterday evening  Pertinent Negatives: Patient denies SOB or difficulty breathing Disposition: [] ED /[] Urgent Care (no appt availability in office) / [x] Appointment(In office/virtual)/ []  [place] [facility]/ [] Home Care/ [] Refused Recommended Disposition /[] [place] [facility]/ []  Follow-up with PCP Additional Notes: pt states she ate pineapple yesterday evening and has had allergic reaction with above sx. Normally just has tongue itching and slight swelling but this reaction is worse. She took Benadryl around 0200 and feels like made the reaction worse. Pt requesting to be seen. Scheduled OV with Quillian Quince, [place] today at 1600 and advised pt if sx get worse before then or has difficulty swallowing or breathing to go to ED. She verbalized understanding.   Reason for Disposition  [1] Mild face swelling from food reaction AND [2] diagnosis never confirmed by a doctor (or NP/PA)  Answer Assessment - Initial Assessment Questions 1. SYMPTOMS: "What is the most serious symptom?"     Face rash and itching  2. AIRWAY: "Are they breathing?" (e.g., Yes, No)  (R/O: airway blockage, respiratory arrest)      no 3. BREATHING: "Is there difficulty breathing?" (e.g., Yes, No; wheezing, unable to complete a sentence)  (R/O: respiratory distress)     no 4. CIRCULATION: "Are you feeling weak?"  (e.g., Yes, No, Unknown; severity)  (R/O: shock) If Yes, ask: "Can you stand and walk normally?"     no 5. SWALLOWING: "Can you swallow?" (e.g.,Yes, No; food, fluid, saliva)      Hadn't ate or drank anything today so far 6. ONSET: "When did the reaction start?" (Minutes or hours ago)      Last night 7. SUBSTANCE: "What are you reacting to?" "When did the contact occur?"      Pineapple 8. PREVIOUS REACTION: "Have you ever reacted to it before?" If Yes, ask: "What happened that time?"     Yes just never this bad  Protocols  used: Anaphylaxis-A-AH, Face Swelling-A-AH

## 2022-04-27 NOTE — Progress Notes (Deleted)
Date:  04/27/2022   Name:  Dawn Hunter   DOB:  [birth year]   MRN:  [id number]   Chief Complaint: Allergic Reaction  Allergic Reaction This is a new problem. The current episode started yesterday. The problem has been gradually worsening since onset. The problem is moderate. Associated symptoms include itching, a rash and trouble swallowing. There is no swelling present. Treatments tried: benadryl and cortisone cream.     Recent Labs     Component Value Date/Time   NA 136 03/02/2022 1810   NA 142 06/01/2021 1007   K 3.8 03/02/2022 1810   CL 100 03/02/2022 1810   CO2 28 03/02/2022 1810   GLUCOSE 102 (H) 03/02/2022 1810   BUN 16 03/02/2022 1810   BUN 7 06/01/2021 1007   CREATININE 0.95 03/02/2022 1810   CALCIUM 9.0 03/02/2022 1810   PROT 6.8 06/01/2021 1007   ALBUMIN 4.5 06/01/2021 1007   AST 16 06/01/2021 1007   ALT 15 06/01/2021 1007   ALKPHOS 73 06/01/2021 1007   BILITOT 0.4 06/01/2021 1007   GFRNONAA >60 03/02/2022 1810    Lab Results  Component Value Date   WBC 8.2 03/19/2022   HGB 14.6 03/19/2022   HCT 41.5 03/19/2022   MCV 94 03/19/2022   PLT 380 03/19/2022   No results found for: "HGBA1C" Lab Results  Component Value Date   CHOL 180 06/01/2021   HDL 59 06/01/2021   LDLCALC 105 (H) 06/01/2021   TRIG 86 06/01/2021   CHOLHDL 3.1 06/01/2021   Lab Results  Component Value Date   TSH 1.810 03/19/2022    Review of Systems  HENT:  Positive for trouble swallowing.   Skin:  Positive for itching and rash.    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   Greater trochanteric pain syndrome of left lower extremity 02/01/2022   Spondylosis of lumbosacral region without myelopathy or radiculopathy 01/26/2022   Cystitis 01/26/2022   Lumbar spondylosis 10/19/2021   Lumbar degenerative disc disease 10/19/2021   Chronic pain syndrome 10/19/2021   Pain in axilla 09/14/2021   Axillary tenderness  09/14/2021   Axillary mass, bilateral 09/01/2021   Need for Tdap vaccination 09/01/2021   Tinea corporis 06/01/2021   Annual physical exam 06/01/2021   Symptomatic mammary hypertrophy 05/04/2021   Left lumbosacral radiculopathy 05/04/2021   Hypertension 05/04/2021    No Known Allergies  Past Surgical History:  Procedure Laterality Date   CESAREAN SECTION     TUBAL LIGATION      Social History   Tobacco Use   Smoking status: Never   Smokeless tobacco: Never  Vaping Use   Vaping Use: Never used  Substance Use Topics   Alcohol use: Not Currently   Drug use: Not Currently     Medication list has been reviewed and updated.  No outpatient medications have been marked as taking for the 04/27/22 encounter (Office Visit) with Jeannene Patella, PA.       03/05/2022    9:36 AM 09/01/2021    9:04 AM 06/01/2021    9:20 AM 05/04/2021    8:59 AM  GAD 7 : Generalized Anxiety Score  Nervous, Anxious, on Edge 0 0 0   Control/stop worrying 1 1 0 1  Worry too much - different things 0 0 0 1  Trouble relaxing 1 1 0 2  Restless 1 1 0 1  Easily annoyed or irritable 1 1 0 1  Afraid - awful might happen 0 0 0 0  Total GAD 7 Score 4 4 0   Anxiety Difficulty Somewhat difficult Somewhat difficult Not difficult at all Not difficult at all       03/05/2022    9:36 AM 09/01/2021    9:04 AM 06/01/2021    9:20 AM  Depression screen PHQ 2/9  Decreased Interest 0 0 0  Down, Depressed, Hopeless 0 0 0  PHQ - 2 Score 0 0 0  Altered sleeping 1 1 0  Tired, decreased energy 1 1 0  Change in appetite 1 1 0  Feeling bad or failure about yourself  0 0 0  Trouble concentrating 0 0 0  Moving slowly or fidgety/restless 0 1 0  Suicidal thoughts 0 0 0  PHQ-9 Score 3 4 0  Difficult doing work/chores Not difficult at all Somewhat difficult Not difficult at all    BP Readings from Last 3 Encounters:  03/19/22 (!) 144/88  03/05/22 130/86  03/02/22 (!) 158/96    Physical Exam  Wt Readings from Last 3  Encounters:  03/19/22 222 lb (100.7 kg)  03/05/22 218 lb (98.9 kg)  02/01/22 212 lb (96.2 kg)    Ht 5\' 7"  (1.702 m)   BMI 34.77 kg/m   Assessment and Plan:  There are no diagnoses linked to this encounter.   No follow-ups on file.   Partially dictated using Editor, commissioning. Any errors are unintentional.  Lupita Leash, PA-C, DMSc, [place] [phone]

## 2022-04-27 NOTE — Telephone Encounter (Signed)
Refill? Pt seeing Lupita Leash today for something else.

## 2022-04-27 NOTE — Progress Notes (Signed)
Date:  04/27/2022   Name:  Dawn Hunter   DOB:  [birth year]   MRN:  [id number]   Chief Complaint: Allergic Reaction (Rash on chest and around mouth, noticed it after eating a pineapple)  HPI Nurse Triage: Patient states she ate pineapple yesterday evening and has had allergic reaction with face itching, rash on face and chin.  Denies dyspnea or dysphagia.  Normally just has tongue itching and slight swelling but this reaction is worse. She took Benadryl around 0200 and feels like made the reaction worse.   Dawn Hunter is a pleasant 42 year old female new to me today for evaluation of suspected allergic reaction after eating raw pineapple last night, to which she states she usually has some type of mild reaction endorsing tingling/swelling of the tongue.  She did not rinse her mouth out before going to bed last night.  Here today because this reaction seemed more severe than usual, with pruritic burning perioral rash and some swallowing concerns.  Specifically, she denies true dysphagia or odynophagia, but states that when she swallows "it just does not feel quite right, like it does not go down as easy."  She has had both food and fluid today without difficulty or regurgitation.  Denies history of anaphylaxis or EpiPen use.  Denies wheezing or shortness of breath.  Seems a bit anxious today.  Says her friend freaked her out by telling her that pineapple has a "flesh eating component".    Recent Labs     Component Value Date/Time   NA 136 03/02/2022 1810   NA 142 06/01/2021 1007   K 3.8 03/02/2022 1810   CL 100 03/02/2022 1810   CO2 28 03/02/2022 1810   GLUCOSE 102 (H) 03/02/2022 1810   BUN 16 03/02/2022 1810   BUN 7 06/01/2021 1007   CREATININE 0.95 03/02/2022 1810   CALCIUM 9.0 03/02/2022 1810   PROT 6.8 06/01/2021 1007   ALBUMIN 4.5 06/01/2021 1007   AST 16 06/01/2021 1007   ALT 15 06/01/2021 1007   ALKPHOS 73 06/01/2021 1007   BILITOT 0.4 06/01/2021 1007   GFRNONAA >60 03/02/2022  1810    Lab Results  Component Value Date   WBC 8.2 03/19/2022   HGB 14.6 03/19/2022   HCT 41.5 03/19/2022   MCV 94 03/19/2022   PLT 380 03/19/2022   No results found for: "HGBA1C" Lab Results  Component Value Date   CHOL 180 06/01/2021   HDL 59 06/01/2021   LDLCALC 105 (H) 06/01/2021   TRIG 86 06/01/2021   CHOLHDL 3.1 06/01/2021   Lab Results  Component Value Date   TSH 1.810 03/19/2022    Review of Systems  Respiratory:  Negative for cough, chest tightness, shortness of breath and wheezing.   Cardiovascular:  Negative for chest pain.  Skin:  Positive for rash.    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   Greater trochanteric pain syndrome of left lower extremity 02/01/2022   Spondylosis of lumbosacral region without myelopathy or radiculopathy 01/26/2022   Cystitis 01/26/2022   Lumbar spondylosis 10/19/2021   Lumbar degenerative disc disease 10/19/2021   Chronic pain syndrome 10/19/2021   Pain in axilla 09/14/2021   Axillary tenderness 09/14/2021   Axillary mass, bilateral 09/01/2021   Need for Tdap vaccination 09/01/2021   Tinea corporis 06/01/2021   Annual physical exam 06/01/2021   Symptomatic mammary hypertrophy 05/04/2021   Left lumbosacral radiculopathy 05/04/2021   Hypertension 05/04/2021  No Known Allergies  Past Surgical History:  Procedure Laterality Date   CESAREAN SECTION     TUBAL LIGATION      Social History   Tobacco Use   Smoking status: Never   Smokeless tobacco: Never  Vaping Use   Vaping Use: Never used  Substance Use Topics   Alcohol use: Not Currently   Drug use: Not Currently     Medication list has been reviewed and updated.  Current Meds  Medication Sig   [DISCONTINUED] celecoxib (CELEBREX) 100 MG capsule Take 1 capsule (100 mg total) by mouth 2 (two) times daily as needed.       04/27/2022    3:44 PM 03/05/2022    9:36 AM 09/01/2021    9:04 AM 06/01/2021     9:20 AM  GAD 7 : Generalized Anxiety Score  Nervous, Anxious, on Edge 0 0 0 0  Control/stop worrying 0 1 1 0  Worry too much - different things 0 0 0 0  Trouble relaxing 0 1 1 0  Restless 0 1 1 0  Easily annoyed or irritable 0 1 1 0  Afraid - awful might happen 0 0 0 0  Total GAD 7 Score 0 4 4 0  Anxiety Difficulty Not difficult at all Somewhat difficult Somewhat difficult Not difficult at all       04/27/2022    3:43 PM 03/05/2022    9:36 AM 09/01/2021    9:04 AM  Depression screen PHQ 2/9  Decreased Interest 0 0 0  Down, Depressed, Hopeless 0 0 0  PHQ - 2 Score 0 0 0  Altered sleeping 0 1 1  Tired, decreased energy 0 1 1  Change in appetite 0 1 1  Feeling bad or failure about yourself  0 0 0  Trouble concentrating 0 0 0  Moving slowly or fidgety/restless 0 0 1  Suicidal thoughts 0 0 0  PHQ-9 Score 0 3 4  Difficult doing work/chores Not difficult at all Not difficult at all Somewhat difficult    BP Readings from Last 3 Encounters:  04/27/22 128/78  03/19/22 (!) 144/88  03/05/22 130/86    Physical Exam Vitals and nursing note reviewed.  Constitutional:      Appearance: Normal appearance.  HENT:     Mouth/Throat:     Mouth: Mucous membranes are moist. No angioedema.     Pharynx: Oropharynx is clear. Uvula midline. No pharyngeal swelling or posterior oropharyngeal erythema.  Cardiovascular:     Rate and Rhythm: Normal rate and regular rhythm.     Heart sounds: Normal heart sounds.  Pulmonary:     Effort: Pulmonary effort is normal.     Breath sounds: Normal breath sounds.     Wt Readings from Last 3 Encounters:  04/27/22 226 lb (102.5 kg)  03/19/22 222 lb (100.7 kg)  03/05/22 218 lb (98.9 kg)    BP 128/78   Pulse 77   Temp 98.4 F (36.9 C) (Oral)   Ht 5\' 7"  (1.702 m)   Wt 226 lb (102.5 kg)   SpO2 99%   BMI 35.40 kg/m   Assessment and Plan:  1. 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  Advised her to decrease or stop pineapple consumption; if she is going to have pineapple, advised she follow it with other food or drink and always  brush teeth before bed.  Kenalog shot administered today to decrease inflammatory reaction of the face and mouth. - triamcinolone acetonide (KENALOG-40) injection 40 mg  2. Hypertension, unspecified type Seems well-controlled with current medication - hydrochlorothiazide (HYDRODIURIL) 12.5 MG tablet; Take 1 tablet (12.5 mg total) by mouth daily.  Dispense: 90 tablet; Refill: 0   Return if symptoms worsen or fail to improve.   Partially dictated using Editor, commissioning. Any errors are unintentional.  Lupita Leash, PA-C, DMSc, [place] [phone]

## 2022-04-27 NOTE — Telephone Encounter (Signed)
Pt coming in to see dan today, can you get with her about this?

## 2022-04-27 NOTE — Telephone Encounter (Signed)
This has been handled. Pt is not taking the medication.  KP

## 2022-05-04 ENCOUNTER — Ambulatory Visit: Admission: RE | Admit: 2022-05-04 | Payer: Medicaid Other | Source: Ambulatory Visit

## 2022-05-11 ENCOUNTER — Ambulatory Visit: Payer: Medicaid Other | Admitting: Family Medicine

## 2022-05-15 ENCOUNTER — Encounter: Payer: Self-pay | Admitting: Family Medicine

## 2022-05-17 DIAGNOSIS — N939 Abnormal uterine and vaginal bleeding, unspecified: Secondary | ICD-10-CM | POA: Diagnosis not present

## 2022-05-17 DIAGNOSIS — D252 Subserosal leiomyoma of uterus: Secondary | ICD-10-CM | POA: Diagnosis not present

## 2022-05-23 ENCOUNTER — Telehealth: Payer: Self-pay | Admitting: Family Medicine

## 2022-05-23 NOTE — Telephone Encounter (Signed)
Called pt because she missed her 2 mo f/u appt and she had messaged on mychart. I spoke with her and she stated that she went on a cruise and missed her appt. Patient then mentioned that she went to an OBGYN appt and was given birth control for heavy bleeding but now her cramps are even worse and the bleeding is even worse. Patient will call or go back to obgyn for another appt. I explained I will inform her pcp.

## 2022-05-24 NOTE — Telephone Encounter (Signed)
noted 

## 2022-05-25 DIAGNOSIS — N939 Abnormal uterine and vaginal bleeding, unspecified: Secondary | ICD-10-CM | POA: Diagnosis not present

## 2022-05-25 DIAGNOSIS — Z6835 Body mass index (BMI) 35.0-35.9, adult: Secondary | ICD-10-CM | POA: Diagnosis not present

## 2022-05-27 DIAGNOSIS — H6693 Otitis media, unspecified, bilateral: Secondary | ICD-10-CM | POA: Diagnosis not present

## 2022-05-27 DIAGNOSIS — R070 Pain in throat: Secondary | ICD-10-CM | POA: Diagnosis not present

## 2022-06-20 DIAGNOSIS — N83202 Unspecified ovarian cyst, left side: Secondary | ICD-10-CM | POA: Diagnosis not present

## 2022-06-20 DIAGNOSIS — E669 Obesity, unspecified: Secondary | ICD-10-CM | POA: Diagnosis not present

## 2022-06-20 DIAGNOSIS — Z6835 Body mass index (BMI) 35.0-35.9, adult: Secondary | ICD-10-CM | POA: Diagnosis not present

## 2022-06-20 DIAGNOSIS — N939 Abnormal uterine and vaginal bleeding, unspecified: Secondary | ICD-10-CM | POA: Diagnosis not present

## 2022-06-20 DIAGNOSIS — L292 Pruritus vulvae: Secondary | ICD-10-CM | POA: Diagnosis not present

## 2022-07-15 DIAGNOSIS — M79661 Pain in right lower leg: Secondary | ICD-10-CM | POA: Diagnosis not present

## 2022-07-15 DIAGNOSIS — Z79899 Other long term (current) drug therapy: Secondary | ICD-10-CM | POA: Diagnosis not present

## 2022-07-15 DIAGNOSIS — M79604 Pain in right leg: Secondary | ICD-10-CM | POA: Diagnosis not present

## 2022-07-15 DIAGNOSIS — R042 Hemoptysis: Secondary | ICD-10-CM | POA: Diagnosis not present

## 2022-07-15 DIAGNOSIS — M7989 Other specified soft tissue disorders: Secondary | ICD-10-CM | POA: Diagnosis not present

## 2022-08-08 DIAGNOSIS — D259 Leiomyoma of uterus, unspecified: Secondary | ICD-10-CM | POA: Diagnosis not present

## 2022-08-08 DIAGNOSIS — N83202 Unspecified ovarian cyst, left side: Secondary | ICD-10-CM | POA: Diagnosis not present

## 2022-08-10 ENCOUNTER — Ambulatory Visit
Admission: EM | Admit: 2022-08-10 | Discharge: 2022-08-10 | Disposition: A | Discharge status: Home / Self Care | Payer: Medicaid Other

## 2022-08-10 DIAGNOSIS — H02846 Edema of left eye, unspecified eyelid: Secondary | ICD-10-CM

## 2022-08-10 MED ORDER — CETIRIZINE HCL 10 MG PO TABS: 10.0000 mg | ORAL_TABLET | Freq: Every day | ORAL | 0 refills | Status: DC

## 2022-08-10 MED ORDER — OLOPATADINE HCL 0.2 % OP SOLN: 1.0000 [drp] | Freq: Every day | OPHTHALMIC | 0 refills | Status: AC

## 2022-08-10 NOTE — Discharge Instructions (Addendum)
Use eyedrops as directed, take allergy med as directed.  Wash hands before and after touching face.  May use cool compresses to the area.  If symptoms worsen go to emergency room or eye doctor of choice for further evaluation.

## 2022-08-10 NOTE — ED Triage Notes (Signed)
Patient to Urgent Care with complaints of left sided eyelid swelling. Reports waking up with symptoms. States her eye is also itchy, no drainage.  No vision changes. Denies pain.

## 2022-08-10 NOTE — ED Provider Notes (Signed)
Renaldo Fiddler    CSN: [id number] Arrival date & time: 08/10/22  4098      History   Chief Complaint Chief Complaint  Patient presents with   Eye Problem    HPI Cheena Hofmann is a 42 y.o. female.   42 year old female, Luna Wanko, presents to urgent care with chief complaint of left upper eyelid swelling.  Patient reports she awakened this morning with symptoms, positive itching no drainage, no treatment, no visual disturbance.  The history is provided by the patient. No language interpreter was used.    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   Greater trochanteric pain syndrome of left lower extremity 02/01/2022   Spondylosis of lumbosacral region without myelopathy or radiculopathy 01/26/2022   Cystitis 01/26/2022   Lumbar spondylosis 10/19/2021   Lumbar degenerative disc disease 10/19/2021   Chronic pain syndrome 10/19/2021   Pain in axilla 09/14/2021   Axillary tenderness 09/14/2021   Axillary mass, bilateral 09/01/2021   Need for Tdap vaccination 09/01/2021   Tinea corporis 06/01/2021   Annual physical exam 06/01/2021   Symptomatic mammary hypertrophy 05/04/2021   Left lumbosacral radiculopathy 05/04/2021   Hypertension 05/04/2021    Past Surgical History:  Procedure Laterality Date   CESAREAN SECTION     TUBAL LIGATION      OB History     Gravida  5   Para  4   Term  4   Preterm      AB  1   Living  4      SAB      IAB      Ectopic      Multiple      Live Births               Home Medications    Prior to Admission medications   Medication Sig Start Date End Date Taking? Authorizing Provider  cetirizine (ZYRTEC ALLERGY) 10 MG tablet Take 1 tablet (10 mg total) by mouth daily for 5 days. 08/10/22 08/15/22 Yes Seaton Hofmann, Para March, NP  norethindrone (AYGESTIN)  5 MG tablet Start Aygestin 5 mg- take one tablet 4 times per day until bleeding stops and then three times per day for one week and then twice a day for 2 weeks. 05/25/22  Yes [provider]  Olopatadine HCl 0.2 % SOLN Place 1 drop into the left eye daily for 5 doses. 08/10/22 08/15/22 Yes Marcayla Budge, Para March, NP  hydrochlorothiazide (HYDRODIURIL) 12.5 MG tablet Take 1 tablet (12.5 mg total) by mouth daily. 04/27/22   Jerrol Banana, MD    Family History Family History  Problem Relation Age of Onset   Hypertension Mother    Hypertension Father    Diabetes Father    Hypertension Maternal Grandmother    Diabetes Maternal Grandmother    Hypertension Maternal Grandfather    Hypertension Paternal Grandmother    Diabetes Paternal Grandmother    Cancer Paternal Grandmother        Throat   Hypertension Paternal Grandfather    Cancer Paternal Grandfather    Breast cancer Cousin    Hypertension Son     Social History Social History   Tobacco Use   Smoking status: Never   Smokeless tobacco: Never  Vaping Use   Vaping status: Never Used  Substance Use Topics  Alcohol use: Not Currently   Drug use: Not Currently     Allergies   Patient has no known allergies.   Review of Systems Review of Systems  Eyes:  Positive for itching. Negative for pain and discharge.  All other systems reviewed and are negative.    Physical Exam Triage Vital Signs ED Triage Vitals  Encounter Vitals Group     BP 08/10/22 0900 123/84     Systolic BP Percentile --      Diastolic BP Percentile --      Pulse Rate 08/10/22 0900 85     Resp 08/10/22 0900 18     Temp 08/10/22 0900 98.6 F (37 C)     Temp src --      SpO2 08/10/22 0900 98 %     Weight --      Height --      Head Circumference --      Peak Flow --      Pain Score 08/10/22 0857 0     Pain Loc --      Pain Education --      Exclude from Growth Chart --    No data found.  Updated Vital Signs BP 123/84   Pulse 85    Temp 98.6 F (37 C)   Resp 18   SpO2 98%   Visual Acuity Right Eye Distance:   Left Eye Distance:   Bilateral Distance:    Right Eye Near:   Left Eye Near:    Bilateral Near:     Physical Exam Vitals and nursing note reviewed.  Constitutional:      General: She is not in acute distress.    Appearance: She is well-developed and well-groomed.  HENT:     Head: Normocephalic and atraumatic.  Eyes:     General: Vision grossly intact. Gaze aligned appropriately.     Extraocular Movements: Extraocular movements intact.     Conjunctiva/sclera: Conjunctivae normal.     Pupils: Pupils are equal, round, and reactive to light.      Comments: Area of swelling marked, no drainage, sclera clear, EOMI  Cardiovascular:     Rate and Rhythm: Normal rate and regular rhythm.     Pulses: Normal pulses.     Heart sounds: Normal heart sounds. No murmur heard. Pulmonary:     Effort: Pulmonary effort is normal. No respiratory distress.     Breath sounds: Normal breath sounds and air entry.  Abdominal:     Palpations: Abdomen is soft.     Tenderness: There is no abdominal tenderness.  Musculoskeletal:        General: No swelling.     Cervical back: Neck supple.  Skin:    General: Skin is warm and dry.     Capillary Refill: Capillary refill takes less than 2 seconds.  Neurological:     General: No focal deficit present.     Mental Status: She is alert and oriented to person, place, and time.     GCS: GCS eye subscore is 4. GCS verbal subscore is 5. GCS motor subscore is 6.  Psychiatric:        Attention and Perception: Attention normal.        Mood and Affect: Mood normal.        Speech: Speech normal.        Behavior: Behavior normal. Behavior is cooperative.      UC Treatments / Results  Labs (all labs ordered are listed, but only  abnormal results are displayed) Labs Reviewed - No data to display  EKG   Radiology No results found.  Procedures Procedures (including critical  care time)  Medications Ordered in UC Medications - No data to display  Initial Impression / Assessment and Plan / UC Course  I have reviewed the triage vital signs and the nursing notes.  Pertinent labs & imaging results that were available during my care of the patient were reviewed by me and considered in my medical decision making (see chart for details).     Ddx: Left eyelid swelling, stye, insect bite, allergies, cellulitis Final Clinical Impressions(s) / UC Diagnoses   Final diagnoses:  Swelling of left eyelid     Discharge Instructions      Use eyedrops as directed, take allergy med as directed.  Wash hands before and after touching face.  May use cool compresses to the area.  If symptoms worsen go to emergency room or eye doctor of choice for further evaluation.     ED Prescriptions     Medication Sig Dispense Auth. Provider   Olopatadine HCl 0.2 % SOLN Place 1 drop into the left eye daily for 5 doses. 2.5 mL Nyiah Pianka, Para March, NP   cetirizine (ZYRTEC ALLERGY) 10 MG tablet Take 1 tablet (10 mg total) by mouth daily for 5 days. 5 tablet Jenevie Casstevens, Para March, NP      PDMP not reviewed this encounter.   Clancy Gourd, NP 08/10/22 1141

## 2022-08-16 ENCOUNTER — Other Ambulatory Visit: Payer: Self-pay | Admitting: Family Medicine

## 2022-08-16 DIAGNOSIS — I10 Essential (primary) hypertension: Secondary | ICD-10-CM

## 2022-08-17 NOTE — Telephone Encounter (Signed)
Requested Prescriptions  Pending Prescriptions Disp Refills   hydrochlorothiazide (HYDRODIURIL) 12.5 MG tablet [Pharmacy Med Name: HYDROCHLOROTHIAZIDE 12.5MG  TABLETS] 90 tablet 0    Sig: TAKE 1 TABLET(12.5 MG) BY MOUTH DAILY     Cardiovascular: Diuretics - Thiazide Passed - 08/16/2022  3:32 AM      Passed - Cr in normal range and within 180 days    Creatinine, Ser  Date Value Ref Range Status  03/02/2022 0.95 0.44 - 1.00 mg/dL Final         Passed - K in normal range and within 180 days    Potassium  Date Value Ref Range Status  03/02/2022 3.8 3.5 - 5.1 mmol/L Final         Passed - Na in normal range and within 180 days    Sodium  Date Value Ref Range Status  03/02/2022 136 135 - 145 mmol/L Final  06/01/2021 142 134 - 144 mmol/L Final         Passed - Last BP in normal range    BP Readings from Last 1 Encounters:  08/10/22 123/84         Passed - Valid encounter within last 6 months    Recent Outpatient Visits           3 months ago 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   [facility] at [facility], Melton Alar, [place]   5 months ago Spondylosis of lumbosacral region without myelopathy or radiculopathy   [place] [facility] at [facility], Ocie Bob, MD   6 months ago Spondylosis of lumbosacral region without myelopathy or radiculopathy   [place] [facility], Ocie Bob, MD   6 months ago Spondylosis of lumbosacral region without myelopathy or radiculopathy   [facility] at [facility], Ocie Bob, MD   11 months ago Axillary mass, bilateral   [facility] at [facility], Ocie Bob, MD

## 2022-09-25 ENCOUNTER — Emergency Department
Admission: EM | Admit: 2022-09-25 | Discharge: 2022-09-25 | Disposition: A | Discharge status: Home / Self Care | Payer: Medicaid Other | Attending: Student in an Organized Health Care Education/Training Program | Admitting: Student in an Organized Health Care Education/Training Program

## 2022-09-25 ENCOUNTER — Encounter: Payer: Self-pay | Admitting: Emergency Medicine

## 2022-09-25 ENCOUNTER — Other Ambulatory Visit: Payer: Self-pay

## 2022-09-25 ENCOUNTER — Emergency Department: Payer: Medicaid Other

## 2022-09-25 DIAGNOSIS — R519 Headache, unspecified: Secondary | ICD-10-CM | POA: Diagnosis not present

## 2022-09-25 DIAGNOSIS — R5381 Other malaise: Secondary | ICD-10-CM | POA: Diagnosis not present

## 2022-09-25 DIAGNOSIS — R079 Chest pain, unspecified: Secondary | ICD-10-CM | POA: Diagnosis present

## 2022-09-25 DIAGNOSIS — Z1152 Encounter for screening for COVID-19: Secondary | ICD-10-CM | POA: Diagnosis not present

## 2022-09-25 DIAGNOSIS — R5383 Other fatigue: Secondary | ICD-10-CM | POA: Insufficient documentation

## 2022-09-25 DIAGNOSIS — R0789 Other chest pain: Secondary | ICD-10-CM | POA: Diagnosis not present

## 2022-09-25 LAB — CBC
HCT: 39.3 % (ref 36.0–46.0)
Hemoglobin: 13.6 g/dL (ref 12.0–15.0)
MCH: 31.3 pg (ref 26.0–34.0)
MCHC: 34.6 g/dL (ref 30.0–36.0)
MCV: 90.3 fL (ref 80.0–100.0)
Platelets: 340 10*3/uL (ref 150–400)
RBC: 4.35 MIL/uL (ref 3.87–5.11)
RDW: 12.2 % (ref 11.5–15.5)
WBC: 7.7 10*3/uL (ref 4.0–10.5)
nRBC: 0 % (ref 0.0–0.2)

## 2022-09-25 LAB — TROPONIN I (HIGH SENSITIVITY)
Troponin I (High Sensitivity): 3 ng/L (ref <18)
Troponin I (High Sensitivity): <2 ng/L (ref <18)

## 2022-09-25 LAB — BASIC METABOLIC PANEL
Anion gap: 6 (ref 5–15)
BUN: 12 mg/dL (ref 6–20)
CO2: 25 mmol/L (ref 22–32)
Calcium: 9.1 mg/dL (ref 8.9–10.3)
Chloride: 105 mmol/L (ref 98–111)
Creatinine, Ser: 0.81 mg/dL (ref 0.44–1.00)
GFR, Estimated: >60 mL/min (ref >60)
Glucose, Bld: 111 mg/dL — ABNORMAL HIGH (ref 70–99)
Potassium: 3.8 mmol/L (ref 3.5–5.1)
Sodium: 136 mmol/L (ref 135–145)

## 2022-09-25 LAB — SARS CORONAVIRUS 2 BY RT PCR: SARS Coronavirus 2 by RT PCR: NEGATIVE

## 2022-09-25 MED ORDER — ACETAMINOPHEN 325 MG PO TABS
650.0000 mg | ORAL_TABLET | Freq: Once | ORAL | Status: AC
  Administered 2022-09-25: 650 mg via ORAL
  Filled 2022-09-25: qty 2

## 2022-09-25 NOTE — ED Notes (Signed)
Pt A&O x4, no obvious distress noted, respirations regular/unlabored. Pt verbalizes understanding of discharge instructions. Pt able to ambulate from ED independently.   

## 2022-09-25 NOTE — ED Provider Notes (Signed)
[facility] Provider Note    Event Date/Time   First MD Initiated Contact with Patient 09/25/22 1158     (approximate)   History   Chest Pain   HPI  Dawn Hunter is a 42 y.o. female no significant past medical history presents to the ER for evaluation of chest pain for the past 24 hours associate with headache malaise fatigue.  Denies any significant chest discomfort at this time.  No vomiting.  Family members do have a history of diabetes and she was worried that she might be having symptoms of diabetes.     Physical Exam   Triage Vital Signs: ED Triage Vitals  Encounter Vitals Group     BP 09/25/22 1107 130/89     Systolic BP Percentile --      Diastolic BP Percentile --      Pulse Rate 09/25/22 1107 79     Resp 09/25/22 1107 18     Temp 09/25/22 1107 98.2 F (36.8 C)     Temp Source 09/25/22 1107 Oral     SpO2 09/25/22 1107 95 %     Weight 09/25/22 1104 230 lb (104.3 kg)     Height 09/25/22 1104 5\' 8"  (1.727 m)     Head Circumference --      Peak Flow --      Pain Score 09/25/22 1104 5     Pain Loc --      Pain Education --      Exclude from Growth Chart --     Most recent vital signs: Vitals:   09/25/22 1107  BP: 130/89  Pulse: 79  Resp: 18  Temp: 98.2 F (36.8 C)  SpO2: 95%     Constitutional: Alert  Eyes: Conjunctivae are normal.  Head: Atraumatic. Nose: No congestion/rhinnorhea. Mouth/Throat: Mucous membranes are moist.   Neck: Painless ROM.  Cardiovascular:   Good peripheral circulation. Respiratory: Normal respiratory effort.  No retractions.  Gastrointestinal: Soft and nontender.  Musculoskeletal:  no deformity Neurologic:  MAE spontaneously. No gross focal neurologic deficits are appreciated.  Skin:  Skin is warm, dry and intact. No rash noted. Psychiatric: Mood and affect are normal. Speech and behavior are normal.    ED Results / Procedures / Treatments   Labs (all labs ordered are listed, but only  abnormal results are displayed) Labs Reviewed  BASIC METABOLIC PANEL - Abnormal; Notable for the following components:      Result Value   Glucose, Bld 111 (*)    All other components within normal limits  SARS CORONAVIRUS 2 BY RT PCR  CBC  TROPONIN I (HIGH SENSITIVITY)  TROPONIN I (HIGH SENSITIVITY)     EKG  ED ECG REPORT I, Willy Eddy, the attending physician, personally viewed and interpreted this ECG.   Date: 09/25/2022  EKG Time: 11:06  Rate: 80  Rhythm: sinus  Axis: normal  Intervals: normal  ST&T Change: no stemi    RADIOLOGY Please see ED Course for my review and interpretation.  I personally reviewed all radiographic images ordered to evaluate for the above acute complaints and reviewed radiology reports and findings.  These findings were personally discussed with the patient.  Please see medical record for radiology report.    PROCEDURES:  Critical Care performed:   Procedures   MEDICATIONS ORDERED IN ED: Medications  acetaminophen (TYLENOL) tablet 650 mg (650 mg Oral Given 09/25/22 1218)     IMPRESSION / MDM / ASSESSMENT AND PLAN / ED COURSE  I reviewed the triage vital signs and the nursing notes.                              Differential diagnosis includes, but is not limited to, ACS, pericarditis, esophagitis, boerhaaves, pe, dissection, pna, bronchitis, costochondritis  Patient presenting to the ER for evaluation of symptoms as described above.  Based on symptoms, risk factors and considered above differential, this presenting complaint could reflect a potentially life-threatening illness therefore the patient will be placed on continuous pulse oximetry and telemetry for monitoring.  Laboratory evaluation will be sent to evaluate for the above complaints.  Patient low risk by Wells criteria not consistent with dissection.  PERC negative.  Not consistent PE.  X-ray of the chest on my review and interpretation without evidence consolidation or  pneumothorax.  EKG is nonischemic.  Troponins negative x 2.  COVID test negative.  Do not appreciate any significant wheezing.  Patient does endorse increasing stress at home and at work which may be contributing to her presentation.  At this point I do believe she is stable and appropriate for outpatient follow-up.       FINAL CLINICAL IMPRESSION(S) / ED DIAGNOSES   Final diagnoses:  Chest pain, unspecified type     Rx / DC Orders   ED Discharge Orders     None        Note:  This document was prepared using Dragon voice recognition software and may include unintentional dictation errors.    Willy Eddy, MD 09/25/22 [phone]

## 2022-09-25 NOTE — ED Triage Notes (Signed)
Patient to ED via POV for CP that started last PM. Satets chest feels tight. States she is also having HA. Denies cardiac hx.

## 2022-09-26 ENCOUNTER — Ambulatory Visit: Payer: Medicaid Other | Admitting: Physician Assistant

## 2022-09-26 ENCOUNTER — Encounter: Payer: Self-pay | Admitting: Family Medicine

## 2022-09-26 ENCOUNTER — Telehealth: Payer: Self-pay

## 2022-09-26 ENCOUNTER — Telehealth: Payer: Self-pay | Admitting: Family Medicine

## 2022-09-26 ENCOUNTER — Ambulatory Visit: Payer: Medicaid Other | Admitting: Family Medicine

## 2022-09-26 VITALS — BP 118/78 | HR 78 | Ht 68.0 in | Wt 220.0 lb

## 2022-09-26 DIAGNOSIS — M47817 Spondylosis without myelopathy or radiculopathy, lumbosacral region: Secondary | ICD-10-CM | POA: Diagnosis not present

## 2022-09-26 DIAGNOSIS — N62 Hypertrophy of breast: Secondary | ICD-10-CM

## 2022-09-26 MED ORDER — DULOXETINE HCL 30 MG PO CPEP: 30.0000 mg | ORAL_CAPSULE | Freq: Every evening | ORAL | 1 refills | Status: DC

## 2022-09-26 NOTE — Telephone Encounter (Signed)
Patient called in and stated she was going to be more than 10 mins late. Pec scheduled her for a later appointment at 9:20 a.m

## 2022-09-26 NOTE — Telephone Encounter (Signed)
noted 

## 2022-09-26 NOTE — Transitions of Care (Post Inpatient/ED Visit) (Signed)
   09/26/2022  Name: Dawn Hunter MRN: [id number] DOB: 08/14/1980  Today's TOC FU Call Status: Today's TOC FU Call Status:: Successful TOC FU Call Completed TOC FU Call Complete Date: 09/26/22 Patient's Name and Date of Birth confirmed.  Transition Care Management Follow-up Telephone Call Date of Discharge: 09/25/22 Discharge Facility: [facility]) Type of Discharge: Emergency Department Reason for ED Visit: Cardiac Conditions Cardiac Conditions Diagnosis: Chest Pain Persisting How have you been since you were released from the hospital?: Better Any questions or concerns?: No  Items Reviewed: Did you receive and understand the discharge instructions provided?: Yes Medications obtained,verified, and reconciled?: Yes (Medications Reviewed) Any new allergies since your discharge?: No Dietary orders reviewed?: No Do you have support at home?: No  Medications Reviewed Today: Medications Reviewed Today   Medications were not reviewed in this encounter     Home Care and Equipment/Supplies: Were Home Health Services Ordered?: No Any new equipment or medical supplies ordered?: No  Functional Questionnaire: Do you need assistance with bathing/showering or dressing?: No Do you need assistance with meal preparation?: No Do you need assistance with eating?: No Do you have difficulty maintaining continence: No Do you need assistance with getting out of bed/getting out of a chair/moving?: No Do you have difficulty managing or taking your medications?: No  Follow up appointments reviewed: PCP Follow-up appointment confirmed?: Yes Date of PCP follow-up appointment?: 09/26/22 Follow-up Provider: Dr Ashley Royalty [facility] Follow-up appointment confirmed?: No Do you need transportation to your follow-up appointment?: No Do you understand care options if your condition(s) worsen?: Yes-patient verbalized understanding    SIGNATURE

## 2022-09-28 NOTE — Progress Notes (Signed)
     Primary Care / Sports Medicine Office Visit  Patient Information:  Patient ID: Dawn Hunter, female DOB: 1980/06/08 Age: 42 y.o. MRN: [id number]   Dawn Hunter is a pleasant 42 y.o. female presenting with the following:  Chief Complaint  Patient presents with   Hospitalization Follow-up    Vitals:   09/26/22 0924  BP: 118/78  Pulse: 78  SpO2: 97%   Vitals:   09/26/22 0924  Weight: 220 lb (99.8 kg)  Height: 5\' 8"  (1.727 m)   Body mass index is 33.45 kg/m.  DG Chest 2 View  Result Date: 09/25/2022 CLINICAL DATA:  Chest pain EXAM: CHEST - 2 VIEW COMPARISON:  X-ray 01/25/2021 FINDINGS: No consolidation, pneumothorax or effusion. No edema. Normal cardiopericardial silhouette. Surgical clips in the right upper quadrant. Air-fluid level along the stomach beneath the left hemidiaphragm. IMPRESSION: No acute cardiopulmonary disease Electronically Signed   By: Karen Kays M.D.   On: 09/25/2022 12:38     Independent interpretation of notes and tests performed by another provider:   None  Procedures performed:   None  Pertinent History, Exam, Impression, and Recommendations:   Dawn Hunter was seen today for hospitalization follow-up.  Breast hypertrophy -     Ambulatory referral to Plastic Surgery  Spondylosis of lumbosacral region without myelopathy or radiculopathy -     Ambulatory referral to Physical Therapy  Other orders -     DULoxetine HCl; Take 1 capsule (30 mg total) by mouth at bedtime. X 1 week then 2 capsules (60 mg total) nightly  Dispense: 90 capsule; Refill: 1     Orders & Medications Meds ordered this encounter  Medications   DULoxetine (CYMBALTA) 30 MG capsule    Sig: Take 1 capsule (30 mg total) by mouth at bedtime. X 1 week then 2 capsules (60 mg total) nightly    Dispense:  90 capsule    Refill:  1   Orders Placed This Encounter  Procedures   Ambulatory referral to Physical Therapy   Ambulatory referral to Plastic Surgery    I provided  a total time of 68 minutes including both face-to-face and non-face-to-face time on 09/28/2022 inclusive of time utilized for medical chart review, information gathering, care coordination with staff, and documentation completion.   No follow-ups on file.     Jerrol Banana, MD, [facility]   Primary Care Sports Medicine Primary Care and Sports Medicine at [facility]

## 2022-09-28 NOTE — Assessment & Plan Note (Signed)
Ran into barriers to schedule and perform PT, amenable to restart.

## 2022-09-28 NOTE — Assessment & Plan Note (Signed)
Ongoing severe lumbosacral pain in the setting of mammary hypertrophy. Will send to [facility] for further evaluation and management

## 2022-09-28 NOTE — Assessment & Plan Note (Signed)
>>  ASSESSMENT AND PLAN FOR SPONDYLOSIS OF LUMBOSACRAL REGION WITHOUT MYELOPATHY OR RADICULOPATHY WRITTEN ON 09/28/2022  8:15 PM BY Jamesmichael Shadd J, MD  Ran into barriers to schedule and perform PT, amenable to restart.

## 2022-10-02 ENCOUNTER — Ambulatory Visit: Payer: Medicaid Other | Admitting: Family Medicine

## 2022-10-10 ENCOUNTER — Other Ambulatory Visit: Payer: Self-pay

## 2022-10-10 ENCOUNTER — Ambulatory Visit: Payer: Medicaid Other | Attending: Family Medicine

## 2022-10-10 DIAGNOSIS — G8929 Other chronic pain: Secondary | ICD-10-CM | POA: Diagnosis not present

## 2022-10-10 DIAGNOSIS — M6281 Muscle weakness (generalized): Secondary | ICD-10-CM | POA: Insufficient documentation

## 2022-10-10 DIAGNOSIS — M47817 Spondylosis without myelopathy or radiculopathy, lumbosacral region: Secondary | ICD-10-CM | POA: Diagnosis not present

## 2022-10-10 DIAGNOSIS — M5442 Lumbago with sciatica, left side: Secondary | ICD-10-CM | POA: Insufficient documentation

## 2022-10-10 NOTE — Addendum Note (Signed)
Addended by: Charlene Brooke on: 10/10/2022 05:13 PM   Modules accepted: Orders

## 2022-10-10 NOTE — Therapy (Cosign Needed Addendum)
OUTPATIENT PHYSICAL THERAPY THORACOLUMBAR EVALUATION   Patient Name: Mars Towsley MRN: [id number] DOB:1980/03/22, 42 y.o., female Today's Date: 10/10/2022  END OF SESSION:  PT End of Session - 10/10/22 1641     Visit Number 1    Number of Visits 13    Date for PT Re-Evaluation 11/21/22    PT Start Time 1522    PT Stop Time 1610    PT Time Calculation (min) 48 min    Activity Tolerance Patient tolerated treatment well;Patient limited by pain    Behavior During Therapy [facility] for tasks assessed/performed             Past Medical History:  Diagnosis Date   Anxiety 10/30/2013   Hypertension    Past Surgical History:  Procedure Laterality Date   CESAREAN SECTION     CHOLECYSTECTOMY  08/09/2013   TUBAL LIGATION     Patient Active Problem List   Diagnosis Date Noted   Swelling of left eyelid 08/10/2022   Arthropathy of left sacroiliac joint 02/01/2022   Greater trochanteric pain syndrome of left lower extremity 02/01/2022   Spondylosis of lumbosacral region without myelopathy or radiculopathy 01/26/2022   Cystitis 01/26/2022   Lumbar spondylosis 10/19/2021   Lumbar degenerative disc disease 10/19/2021   Chronic pain syndrome 10/19/2021   Pain in axilla 09/14/2021   Axillary tenderness 09/14/2021   Axillary mass, bilateral 09/01/2021   Need for Tdap vaccination 09/01/2021   Tinea corporis 06/01/2021   Annual physical exam 06/01/2021   Symptomatic mammary hypertrophy 05/04/2021   Left lumbosacral radiculopathy 05/04/2021   Hypertension 05/04/2021    PCP: Jerrol Banana, MD  REFERRING PROVIDER: Jerrol Banana, MD  REFERRING DIAG:  [phone] (ICD-10-CM) - Spondylosis of lumbosacral region without myelopathy or radiculopathy    Rationale for Evaluation and Treatment: Rehabilitation  THERAPY DIAG:  Muscle weakness (generalized)  Chronic left-sided low back pain with left-sided sciatica  ONSET DATE: 2016, most recent episode since Novemnber  2023  SUBJECTIVE:                                                                                                                                                                                           SUBJECTIVE STATEMENT: Pt is a 42 y/o F reporting to [facility] with chronic low back pain with N/T down the LLE. She reports this pain increased since last November and had insidious onset stemming from 2016.  PERTINENT HISTORY:   Pt reports chronic low back pain w/ LLE N/T. This pain began with insidious onset around 2016 and this current episode has continued to worsen since November, 2023. Pain on the L sided  lower back and into the L buttock area specifically around the L piriformis. Pain is described as stabbing and throbbing. Radicular symptoms noted traveling down her posterior LLE along S1 dermatome. Pt's aggravating factors include prolonged walking, sitting, and standing and the pain is relieved with OTC medication, self massage of L lumbar paraspinals, and heating pad application. Pt's pain is worse in the morning compared to throughout the day.Pt has had a corticosteroid shot in her back to assist with her pain, but the this did not help much.   PAIN:  Are you having pain? Yes: NPRS scale: 6/10 Pain location: Left paraspinals> R paraspinals Pain description: Throbbing pain, stabbing,  Aggravating factors: Walking, standing, sitting (worse with extension)  Relieving factors: OTC medications, heat pad, massage Pain at worse is 10/10 during aggravating factors.     PRECAUTIONS: None  RED FLAGS: Bowel or bladder incontinence: No, Spinal tumors: No, and Cauda equina syndrome: No   WEIGHT BEARING RESTRICTIONS: No  FALLS:  Has patient fallen in last 6 months? No  OCCUPATION: Warehouse (lots of standing and walking)   PLOF: Independent  PATIENT GOALS: To be able to do ADL's pain- free  NEXT MD VISIT: October, 2024  OBJECTIVE:   DIAGNOSTIC FINDINGS:  DG Lumbar Spine Complete  05/05/2022  CLINICAL DATA:  Chronic low back pain, left side radiculopathy into calf, progressively worsening.   EXAM: LUMBAR SPINE - COMPLETE 4+ VIEW   COMPARISON:  None.   FINDINGS: Five non-rib-bearing lumbar vertebral bodies. Lower lumbar spine facet arthropathy. Osteophyte formation along the thoracic spine. There is no evidence of lumbar spine fracture. Alignment is normal. Intervertebral disc spaces are maintained.   Right upper quadrant surgical clips. Phleboliths noted overlying the pelvis.   IMPRESSION: No acute displaced fracture or traumatic listhesis of the lumbar spine.    PATIENT SURVEYS:  FOTO 43/53  SCREENING FOR RED FLAGS: Bowel or bladder incontinence: No Spinal tumors: No Cauda equina syndrome: No Compression fracture: No Abdominal aneurysm: No  COGNITION: Overall cognitive status: Within functional limits for tasks assessed     SENSATION: WFL LQNS performed- negative   MUSCLE LENGTH: WFL  POSTURE: rounded shoulders and forward head  PALPATION: TTP around L lumbar paraspinals> R lumbar paraspinals, and around L piriformis/ gluteal musculature.   LUMBAR ROM:   AROM eval  Flexion 75%*  Extension 50%*  Right lateral flexion 75%  Left lateral flexion 50%*  Right rotation 75%  Left rotation 50%*   (Blank rows = not tested) *= concordant pain  LOWER EXTREMITY MMT:    MMT Right eval Left eval  Hip flexion 4+ 4  Hip extension 4- 4-  Hip abduction 4- 4-  Hip adduction 4- 4-  Hip internal rotation 4 4  Hip external rotation 4 4  Knee flexion 4+ 4+  Knee extension 4+ 4+  Ankle dorsiflexion 4+ 4+   (Blank rows = not tested)  LUMBAR SPECIAL TESTS:  Slump test: Negative, FABER test: Negative, and Long sit test: Negative  FADDIR: Negative FUNCTIONAL TESTS:  5 times sit to stand: To be assessed at next visit 6 minute walk test: To be assessed at next visit  JOINT MOBILITY: L1- L5 hypomobility noted w/ PAM's, w/ concordant pain in  the throughout all lumbar segments.   TODAY'S TREATMENT:  DATE: 10/10/22  Reviewed pt's HEP (reps/ sets/ frequency)    PATIENT EDUCATION:  Education details: HEP given to pt Person educated: Patient Education method: Explanation, Demonstration, and Handouts Education comprehension: verbalized understanding and returned demonstration  HOME EXERCISE PROGRAM: Access Code: [email or web address] URL: [email or web address] Date: 10/10/2022 Prepared by: Loralyn Freshwater  Exercises - Supine Piriformis Stretch with Foot on Ground  - 1-3 x daily - 7 x weekly - 1 sets - 3 reps - 30 seconds  hold - supine single knee to chest position with hip extension isometrics  - 1-3 x daily - 7 x weekly - 2 sets - 8 reps - Prone Hip Extension  - 1 x daily - 5-7 x weekly - 2 sets - 8 reps - Sidelying Hip Abduction  - 1 x daily - 5-7 x weekly - 2 sets - 8 reps  ASSESSMENT:  CLINICAL IMPRESSION: Patient is an 42 y.o. F who was seen today for physical therapy evaluation and treatment for chronic low back pain w/ L sided N/T sx. Pt demonstrates hypomobility in L1-L5 segments, with concordant pain noted w/ PAM's throughout all lumbar segments . Pt notes N/T sx traveling down the posterior aspect of the LLE however, noted a negative slump test. Bilat hip joint testing completed w/ no signs of hip pathology, displayed w/ a negative FABER and FADDIR test. Pt presents with deficits including proximal hip weakness, and LBP with prolonged walking, sitting and standing activities. Pt also has TTP diffusely throughout the L sided lumbar paraspinals and L sided gluteal musculature specifically around the L piriformis muscle w/ concordant N/T down LLE. These deficits are limiting participation in prolonged standing and walking ADL's due to moderate pain levels. Pt will benefit from skilled PT services to  address these deficits to reduce pain and maximize functional capacity with walking, standing, and personal ADL's.     OBJECTIVE IMPAIRMENTS: decreased mobility, decreased strength, hypomobility, and pain.   ACTIVITY LIMITATIONS: lifting, bending, sitting, and standing  PARTICIPATION LIMITATIONS: community activity and work duties  PERSONAL FACTORS: Age and Time since onset of injury/illness/exacerbation are also affecting patient's functional outcome.   REHAB POTENTIAL: Good  CLINICAL DECISION MAKING: Stable/uncomplicated  EVALUATION COMPLEXITY: low   GOALS: Goals reviewed with patient? Yes  SHORT TERM GOALS: Target date: 10/31/22  Pt will be independent with HEP to improve hip and core strength and decrease pain with functional activities  Baseline:10/10/22: HEP given to pt Goal status: INITIAL  LONG TERM GOALS: Target date: 11/21/22  Pt will improve FOTO to target score to demonstrate clinically significant improvement in functional mobility.  Baseline: 10/10/22: 43/53 Goal status: INITIAL  2. Pt will decrease worst back pain as reported NPS by at least 2 points in order to demonstrate clinically significant reduction in back pain and improve ability to perform tasks which involve standing, walking and sitting more comfortably.   Baseline: 10/10/22: worst LBP 10/10 w/ activity Goal status: INITIAL  3.  Pt will increase 5xSTS to <12 seconds to display clinically significant improvements in LE functional strength and improve pt's IND w/ ADL's.  Baseline: 10/10/22: 5xSTS deferred to next session Goal status: INITIAL   4.  Pt will increase 6 MWT by > 165' to display improvements in functional endurance with community ambulation w/ < __/10 NPS LBP. Baseline: 10/10/22: deferred to next session.  Goal status: INITIAL  PLAN:  PT FREQUENCY: 2x/week  PT DURATION: 6 weeks  PLANNED INTERVENTIONS: Therapeutic exercises, Therapeutic activity, Neuromuscular re-education, Balance  training, Gait training, Patient/Family  education, Self Care, Joint mobilization, Stair training, Dry Needling, Spinal manipulation, Spinal mobilization, Cryotherapy, Moist heat, Manual therapy, and Re-evaluation.  PLAN FOR NEXT SESSION: Reassess HEP, Assess 5xSTS and .   Lovie Macadamia, SPT  Laygo,Miguel, PT 10/10/2022, 4:59 PM    Student was supervised throughout session by PT.    Loralyn Freshwater PT, DPT

## 2022-10-15 ENCOUNTER — Telehealth: Payer: Self-pay

## 2022-10-15 ENCOUNTER — Ambulatory Visit: Payer: Medicaid Other

## 2022-10-15 NOTE — Telephone Encounter (Signed)
Discussed w/ pt about no show for appt today at 9:45am, pt states she forgot about her appt but confirmed she will be here on Thursday at next appt.

## 2022-10-18 ENCOUNTER — Ambulatory Visit: Payer: Medicaid Other

## 2022-10-24 ENCOUNTER — Ambulatory Visit: Payer: Medicaid Other

## 2022-10-24 DIAGNOSIS — M6281 Muscle weakness (generalized): Secondary | ICD-10-CM

## 2022-10-24 DIAGNOSIS — M5442 Lumbago with sciatica, left side: Secondary | ICD-10-CM | POA: Diagnosis not present

## 2022-10-24 DIAGNOSIS — M47817 Spondylosis without myelopathy or radiculopathy, lumbosacral region: Secondary | ICD-10-CM | POA: Diagnosis not present

## 2022-10-24 DIAGNOSIS — G8929 Other chronic pain: Secondary | ICD-10-CM

## 2022-10-24 NOTE — Therapy (Addendum)
OUTPATIENT PHYSICAL THERAPY THORACOLUMBAR TREATMENT   Patient Name: Dawn Hunter MRN: [id number] DOB:[birth year], 42 y.o., female Today's Date: 10/24/2022  END OF SESSION:  PT End of Session - 10/24/22 1257     Visit Number 2    Number of Visits 13    Date for PT Re-Evaluation 11/21/22    PT Start Time 1259    PT Stop Time 1342    PT Time Calculation (min) 43 min    Activity Tolerance Patient tolerated treatment well;Patient limited by pain    Behavior During Therapy [facility] for tasks assessed/performed             Past Medical History:  Diagnosis Date   Anxiety 10/30/2013   Hypertension    Past Surgical History:  Procedure Laterality Date   CESAREAN SECTION     CHOLECYSTECTOMY  08/09/2013   TUBAL LIGATION     Patient Active Problem List   Diagnosis Date Noted   Swelling of left eyelid 08/10/2022   Arthropathy of left sacroiliac joint 02/01/2022   Greater trochanteric pain syndrome of left lower extremity 02/01/2022   Spondylosis of lumbosacral region without myelopathy or radiculopathy 01/26/2022   Cystitis 01/26/2022   Lumbar spondylosis 10/19/2021   Lumbar degenerative disc disease 10/19/2021   Chronic pain syndrome 10/19/2021   Pain in axilla 09/14/2021   Axillary tenderness 09/14/2021   Axillary mass, bilateral 09/01/2021   Need for Tdap vaccination 09/01/2021   Tinea corporis 06/01/2021   Annual physical exam 06/01/2021   Symptomatic mammary hypertrophy 05/04/2021   Left lumbosacral radiculopathy 05/04/2021   Hypertension 05/04/2021    PCP: Jerrol Banana, MD  REFERRING PROVIDER: Jerrol Banana, MD  REFERRING DIAG:  [phone] (ICD-10-CM) - Spondylosis of lumbosacral region without myelopathy or radiculopathy    Rationale for Evaluation and Treatment: Rehabilitation  THERAPY DIAG:  Chronic left-sided low back pain with left-sided sciatica  Muscle weakness (generalized)  ONSET DATE: 2016, most recent episode since November  2023  SUBJECTIVE:                                                                                                                                                                                           SUBJECTIVE STATEMENT: Pt reports a 5/10 pain in the LBP. Pt notes mild increase in pain w/ HEP.   PERTINENT HISTORY:   Pt reports chronic low back pain w/ LLE N/T. This pain began with insidious onset around 2016 and this current episode has continued to worsen since November, 2023. Pain on the L sided lower back and into the L buttock area specifically around the L piriformis. Pain is described as  stabbing and throbbing. Radicular symptoms noted traveling down her posterior LLE along S1 dermatome. Pt's aggravating factors include prolonged walking, sitting, and standing and the pain is relieved with OTC medication, self massage of L lumbar paraspinals, and heating pad application. Pt's pain is worse in the morning compared to throughout the day.Pt has had a corticosteroid shot in her back to assist with her pain, but the this did not help much.   PAIN:  Are you having pain? Yes: NPRS scale: 5/10 Pain location: Left paraspinals> R paraspinals Pain description: Throbbing pain, stabbing,  Aggravating factors: Walking, standing, sitting (worse with extension)  Relieving factors: OTC medications, heat pad, massage Pain at worse is 10/10 during aggravating factors.     PRECAUTIONS: None  RED FLAGS: Bowel or bladder incontinence: No, Spinal tumors: No, and Cauda equina syndrome: No   WEIGHT BEARING RESTRICTIONS: No  FALLS:  Has patient fallen in last 6 months? No  OCCUPATION: Warehouse (lots of standing and walking)   PLOF: Independent  PATIENT GOALS: To be able to do ADL's pain- free  NEXT MD VISIT: October, 2024  OBJECTIVE:   DIAGNOSTIC FINDINGS:  DG Lumbar Spine Complete 05/05/2022  CLINICAL DATA:  Chronic low back pain, left side radiculopathy into calf, progressively  worsening.   EXAM: LUMBAR SPINE - COMPLETE 4+ VIEW   COMPARISON:  None.   FINDINGS: Five non-rib-bearing lumbar vertebral bodies. Lower lumbar spine facet arthropathy. Osteophyte formation along the thoracic spine. There is no evidence of lumbar spine fracture. Alignment is normal. Intervertebral disc spaces are maintained.   Right upper quadrant surgical clips. Phleboliths noted overlying the pelvis.   IMPRESSION: No acute displaced fracture or traumatic listhesis of the lumbar spine.    PATIENT SURVEYS:  FOTO 43/53  SCREENING FOR RED FLAGS: Bowel or bladder incontinence: No Spinal tumors: No Cauda equina syndrome: No Compression fracture: No Abdominal aneurysm: No  COGNITION: Overall cognitive status: Within functional limits for tasks assessed     SENSATION: WFL LQNS performed- negative   MUSCLE LENGTH: WFL  POSTURE: rounded shoulders and forward head  PALPATION: TTP around L lumbar paraspinals> R lumbar paraspinals, and around L piriformis/ gluteal musculature.   LUMBAR ROM:   AROM eval  Flexion 75%*  Extension 50%*  Right lateral flexion 75%  Left lateral flexion 50%*  Right rotation 75%  Left rotation 50%*   (Blank rows = not tested) *= concordant pain  LOWER EXTREMITY MMT:    MMT Right eval Left eval  Hip flexion 4+ 4  Hip extension 4- 4-  Hip abduction 4- 4-  Hip adduction 4- 4-  Hip internal rotation 4 4  Hip external rotation 4 4  Knee flexion 4+ 4+  Knee extension 4+ 4+  Ankle dorsiflexion 4+ 4+   (Blank rows = not tested)  LUMBAR SPECIAL TESTS:  Slump test: Negative, FABER test: Negative, and Long sit test: Negative  FADDIR: Negative FUNCTIONAL TESTS:  5 times sit to stand: 9.47sec 6 minute walk test: 1428.68ft  JOINT MOBILITY: L1- L5 hypomobility noted w/ PAM's, w/ concordant pain in the throughout all lumbar segments.   TODAY'S TREATMENT:  DATE: 10/24/22  5xSTS  administered: 9.47 seconds administered: 1428.31ft  Reviewed pt's HEP (reps/ sets/ frequency)    Supine Piriformis Stretch with Foot on Ground RLE/LLE 2 x30sec   Double knee to chest w/ towel bilat LE's 2 x10   Blue physioball rollouts w/ trunk flexion forward, lateral flexion R/L x10/ each direction  Standing hip extension RLE/LLE w/ bilat UE support x10/ each side  Standing hip abduction RLE/LLE w/ bilat UE support x10/ both sides  Sidelying hip abduction RLE/LLE w/ bilat UE support 10/ both sides  Supine glute bridges x10  Manual Therapy:   TTP at left sided lumbar paraspinals and SIJ tenderness noted w/ mobilization. ~5 minutes      PATIENT EDUCATION:  Education details: HEP given to pt Person educated: Patient Education method: Explanation, Demonstration, and Handouts Education comprehension: verbalized understanding and returned demonstration  HOME EXERCISE PROGRAM: Access Code: [email or web address] URL: [email or web address] Date: 10/10/2022 Prepared by: Loralyn Freshwater  Exercises - Supine Piriformis Stretch with Foot on Ground  - 1-3 x daily - 7 x weekly - 1 sets - 3 reps - 30 seconds  hold - supine single knee to chest position with hip extension isometrics  - 1-3 x daily - 7 x weekly - 2 sets - 8 reps - Prone Hip Extension  - 1 x daily - 5-7 x weekly - 2 sets - 8 reps - Sidelying Hip Abduction  - 1 x daily - 5-7 x weekly - 2 sets - 8 reps  ASSESSMENT:  CLINICAL IMPRESSION: Session focused on administering the 5xSTS and along w/ reviewing/ updating pt's HEP. Pt notes a 9.47sec 5xSTS, noting within her age- matched norms for this assessment. Pt ambulated 1428.5 ft during the however, noted 6/10 NPS in LBP following this activity with B radicular/referred pain into thighs. Pt's HEP was reviewed and updates were made to decrease LBP sx and improve lumbar mobility. Pt tolerated updates well and  will reassess at next visit. Pt will benefit from skilled PT services to address these deficits to reduce pain and maximize functional capacity with walking, standing, and personal ADL's.     OBJECTIVE IMPAIRMENTS: decreased mobility, decreased strength, hypomobility, and pain.   ACTIVITY LIMITATIONS: lifting, bending, sitting, and standing  PARTICIPATION LIMITATIONS: community activity and work duties  PERSONAL FACTORS: Age and Time since onset of injury/illness/exacerbation are also affecting patient's functional outcome.   REHAB POTENTIAL: Good  CLINICAL DECISION MAKING: Stable/uncomplicated  EVALUATION COMPLEXITY: low   GOALS: Goals reviewed with patient? Yes  SHORT TERM GOALS: Target date: 10/31/22  Pt will be independent with HEP to improve hip and core strength and decrease pain with functional activities  Baseline:10/10/22: HEP given to pt Goal status: INITIAL  LONG TERM GOALS: Target date: 11/21/22  Pt will improve FOTO to target score to demonstrate clinically significant improvement in functional mobility.  Baseline: 10/10/22: 43/53 Goal status: INITIAL  2. Pt will decrease worst back pain as reported NPS by at least 2 points in order to demonstrate clinically significant reduction in back pain and improve ability to perform tasks which involve standing, walking and sitting more comfortably.   Baseline: 10/10/22: worst LBP 10/10 w/ activity Goal status: INITIAL  3.  Pt will increase 5xSTS to <12 seconds to display clinically significant improvements in LE functional strength and improve pt's IND w/ ADL's.  Baseline: 10/10/22: 9.47 sec Goal status: MET    4.  Pt will increase 6 MWT by > 165' to display improvements in functional endurance  with community ambulation w/ < 6/10 NPS LBP. Baseline: 10/10/22: deferred to next session. 10/24/22: 1428.86ft   Goal status: INITIAL  PLAN:  PT FREQUENCY: 2x/week  PT DURATION: 6 weeks  PLANNED INTERVENTIONS: Therapeutic  exercises, Therapeutic activity, Neuromuscular re-education, Balance training, Gait training, Patient/Family education, Self Care, Joint mobilization, Stair training, Dry Needling, Spinal manipulation, Spinal mobilization, Cryotherapy, Moist heat, Manual therapy, and Re-evaluation.  PLAN FOR NEXT SESSION: Progress core/ lumbar mobility exercises. SIJ/ LE strengthening   Lovie Macadamia, SPT  Delphia Grates. Fairly IV, PT, DPT Physical Therapist- [place]  [facility]  10/24/2022, 2:57 PM

## 2022-10-29 ENCOUNTER — Ambulatory Visit: Payer: Medicaid Other

## 2022-11-01 ENCOUNTER — Ambulatory Visit: Payer: Medicaid Other | Attending: Family Medicine

## 2022-11-01 DIAGNOSIS — M6281 Muscle weakness (generalized): Secondary | ICD-10-CM | POA: Insufficient documentation

## 2022-11-01 DIAGNOSIS — M5442 Lumbago with sciatica, left side: Secondary | ICD-10-CM | POA: Diagnosis not present

## 2022-11-01 DIAGNOSIS — G8929 Other chronic pain: Secondary | ICD-10-CM | POA: Insufficient documentation

## 2022-11-01 NOTE — Therapy (Addendum)
OUTPATIENT PHYSICAL THERAPY THORACOLUMBAR TREATMENT   Patient Name: Dawn Hunter MRN: [id number] DOB:1980/02/19, 42 y.o., female Today's Date: 11/01/2022  END OF SESSION:  PT End of Session - 11/01/22 0859     Visit Number 3    Number of Visits 13    Date for PT Re-Evaluation 11/21/22    PT Start Time 0859    PT Stop Time 0943    PT Time Calculation (min) 44 min    Activity Tolerance Patient tolerated treatment well;Patient limited by pain    Behavior During Therapy [facility] for tasks assessed/performed             Past Medical History:  Diagnosis Date   Anxiety 10/30/2013   Hypertension    Past Surgical History:  Procedure Laterality Date   CESAREAN SECTION     CHOLECYSTECTOMY  08/09/2013   TUBAL LIGATION     Patient Active Problem List   Diagnosis Date Noted   Swelling of left eyelid 08/10/2022   Arthropathy of left sacroiliac joint 02/01/2022   Greater trochanteric pain syndrome of left lower extremity 02/01/2022   Spondylosis of lumbosacral region without myelopathy or radiculopathy 01/26/2022   Cystitis 01/26/2022   Lumbar spondylosis 10/19/2021   Lumbar degenerative disc disease 10/19/2021   Chronic pain syndrome 10/19/2021   Pain in axilla 09/14/2021   Axillary tenderness 09/14/2021   Axillary mass, bilateral 09/01/2021   Need for Tdap vaccination 09/01/2021   Tinea corporis 06/01/2021   Annual physical exam 06/01/2021   Symptomatic mammary hypertrophy 05/04/2021   Left lumbosacral radiculopathy 05/04/2021   Hypertension 05/04/2021    PCP: Jerrol Banana, MD  REFERRING PROVIDER: Jerrol Banana, MD  REFERRING DIAG:  [phone] (ICD-10-CM) - Spondylosis of lumbosacral region without myelopathy or radiculopathy    Rationale for Evaluation and Treatment: Rehabilitation  THERAPY DIAG:  Chronic left-sided low back pain with left-sided sciatica  Muscle weakness (generalized)  ONSET DATE: 2016, most recent episode since November  2023  SUBJECTIVE:                                                                                                                                                                                           SUBJECTIVE STATEMENT: Pt reports a 6/10 pain in the LBP and continued stiffness. No notable changes since last session.   PERTINENT HISTORY:   Pt reports chronic low back pain w/ LLE N/T. This pain began with insidious onset around 2016 and this current episode has continued to worsen since November, 2023. Pain on the L sided lower back and into the L buttock area specifically around the L piriformis. Pain is described  as stabbing and throbbing. Radicular symptoms noted traveling down her posterior LLE along S1 dermatome. Pt's aggravating factors include prolonged walking, sitting, and standing and the pain is relieved with OTC medication, self massage of L lumbar paraspinals, and heating pad application. Pt's pain is worse in the morning compared to throughout the day.Pt has had a corticosteroid shot in her back to assist with her pain, but the this did not help much.   PAIN:  Are you having pain? Yes: NPRS scale: 5/10 Pain location: Left paraspinals> R paraspinals Pain description: Throbbing pain, stabbing,  Aggravating factors: Walking, standing, sitting (worse with extension)  Relieving factors: OTC medications, heat pad, massage Pain at worse is 10/10 during aggravating factors.     PRECAUTIONS: None  RED FLAGS: Bowel or bladder incontinence: No, Spinal tumors: No, and Cauda equina syndrome: No   WEIGHT BEARING RESTRICTIONS: No  FALLS:  Has patient fallen in last 6 months? No  OCCUPATION: Warehouse (lots of standing and walking)   PLOF: Independent  PATIENT GOALS: To be able to do ADL's pain- free  NEXT MD VISIT: October, 2024  OBJECTIVE:   DIAGNOSTIC FINDINGS:  DG Lumbar Spine Complete 05/05/2022  CLINICAL DATA:  Chronic low back pain, left side radiculopathy into calf,  progressively worsening.   EXAM: LUMBAR SPINE - COMPLETE 4+ VIEW   COMPARISON:  None.   FINDINGS: Five non-rib-bearing lumbar vertebral bodies. Lower lumbar spine facet arthropathy. Osteophyte formation along the thoracic spine. There is no evidence of lumbar spine fracture. Alignment is normal. Intervertebral disc spaces are maintained.   Right upper quadrant surgical clips. Phleboliths noted overlying the pelvis.   IMPRESSION: No acute displaced fracture or traumatic listhesis of the lumbar spine.    PATIENT SURVEYS:  FOTO 43/53  SCREENING FOR RED FLAGS: Bowel or bladder incontinence: No Spinal tumors: No Cauda equina syndrome: No Compression fracture: No Abdominal aneurysm: No  COGNITION: Overall cognitive status: Within functional limits for tasks assessed     SENSATION: WFL LQNS performed- negative   MUSCLE LENGTH: WFL  POSTURE: rounded shoulders and forward head  PALPATION: TTP around L lumbar paraspinals> R lumbar paraspinals, and around L piriformis/ gluteal musculature.   LUMBAR ROM:   AROM eval  Flexion 75%*  Extension 50%*  Right lateral flexion 75%  Left lateral flexion 50%*  Right rotation 75%  Left rotation 50%*   (Blank rows = not tested) *= concordant pain  LOWER EXTREMITY MMT:    MMT Right eval Left eval  Hip flexion 4+ 4  Hip extension 4- 4-  Hip abduction 4- 4-  Hip adduction 4- 4-  Hip internal rotation 4 4  Hip external rotation 4 4  Knee flexion 4+ 4+  Knee extension 4+ 4+  Ankle dorsiflexion 4+ 4+   (Blank rows = not tested)  LUMBAR SPECIAL TESTS:  Slump test: Negative, FABER test: Negative, and Long sit test: Negative  FADDIR: Negative FUNCTIONAL TESTS:  5 times sit to stand: 9.47sec 6 minute walk test: 1428.66ft  JOINT MOBILITY: L1- L5 hypomobility noted w/ PAM's, w/ concordant pain in the throughout all lumbar segments.   TODAY'S TREATMENT:  DATE: 11/01/22  There.ex:  Nustep x5 minutes lvl 3.0 seat 8 STS from plinth 2 x10  RDL's w/ dowel and 3# AW in the middle 2 x10 (multimodal cueing needed, good carryover)  Prone press-ups x10  Prone press- ups w/ grade 1-2 mobilization of L3-L4 vertebrae x10  Blue physioball rollouts w/ trunk flexion forward, lateral flexion R/L x10/ each direction  W/ heat pack under low back:   LTR's 2 x Double knee to chest w/ towel bilat LE's 2 x10  Supine Piriformis Stretch with Foot on Ground RLE/LLE 2 x30sec   PATIENT EDUCATION:  Education details: HEP given to pt Person educated: Patient Education method: Explanation, Facilities manager, and Handouts Education comprehension: verbalized understanding and returned demonstration  HOME EXERCISE PROGRAM: Access Code: [email or web address] URL: [email or web address] Date: 10/10/2022 Prepared by: Loralyn Freshwater  Exercises - Supine Piriformis Stretch with Foot on Ground  - 1-3 x daily - 7 x weekly - 1 sets - 3 reps - 30 seconds  hold - supine single knee to chest position with hip extension isometrics  - 1-3 x daily - 7 x weekly - 2 sets - 8 reps - Prone Hip Extension  - 1 x daily - 5-7 x weekly - 2 sets - 8 reps - Sidelying Hip Abduction  - 1 x daily - 5-7 x weekly - 2 sets - 8 reps  ASSESSMENT:  CLINICAL IMPRESSION: Session focused on progressing lumbar strength and ROM. Pt continues to note significant low back pain and irritability of sx noted w/ decreased activity tolerance during exercises at today's session. This pain is limiting pt's ADL's and functional mobility. Pt notes a mild decrease in sx w/ flexion based exercises including double knee to chest and forward roll-outs w/ the blue physioball. Interferential NMES will be attempted at next session to attempt to decrease pt's LBP sx. Pt will benefit from skilled PT services to address these deficits to reduce pain and maximize  functional capacity with walking, standing, and personal ADL's.       OBJECTIVE IMPAIRMENTS: decreased mobility, decreased strength, hypomobility, and pain.   ACTIVITY LIMITATIONS: lifting, bending, sitting, and standing  PARTICIPATION LIMITATIONS: community activity and work duties  PERSONAL FACTORS: Age and Time since onset of injury/illness/exacerbation are also affecting patient's functional outcome.   REHAB POTENTIAL: Good  CLINICAL DECISION MAKING: Stable/uncomplicated  EVALUATION COMPLEXITY: low   GOALS: Goals reviewed with patient? Yes  SHORT TERM GOALS: Target date: 10/31/22  Pt will be independent with HEP to improve hip and core strength and decrease pain with functional activities  Baseline:10/10/22: HEP given to pt Goal status: INITIAL  LONG TERM GOALS: Target date: 11/21/22  Pt will improve FOTO to target score to demonstrate clinically significant improvement in functional mobility.  Baseline: 10/10/22: 43/53 Goal status: INITIAL  2. Pt will decrease worst back pain as reported NPS by at least 2 points in order to demonstrate clinically significant reduction in back pain and improve ability to perform tasks which involve standing, walking and sitting more comfortably.   Baseline: 10/10/22: worst LBP 10/10 w/ activity Goal status: INITIAL  3.  Pt will increase 5xSTS to <12 seconds to display clinically significant improvements in LE functional strength and improve pt's IND w/ ADL's.  Baseline: 10/10/22: 9.47 sec Goal status: MET    4.  Pt will increase 6 MWT by > 165' to display improvements in functional endurance with community ambulation w/ < 6/10 NPS LBP. Baseline: 10/10/22: deferred to next session. 10/24/22: 1428.78ft   Goal  status: INITIAL  PLAN:  PT FREQUENCY: 2x/week  PT DURATION: 6 weeks  PLANNED INTERVENTIONS: Therapeutic exercises, Therapeutic activity, Neuromuscular re-education, Balance training, Gait training, Patient/Family education,  Self Care, Joint mobilization, Stair training, Dry Needling, Spinal manipulation, Spinal mobilization, Cryotherapy, Moist heat, Manual therapy, and Re-evaluation.  PLAN FOR NEXT SESSION: TENS for pain modulation, SIJ testing. Core and hip strength.  Lovie Macadamia, SPT  Delphia Grates. Fairly IV, PT, DPT Physical Therapist- [place]  [facility]  11/01/2022, 11:16 AM

## 2022-11-05 ENCOUNTER — Ambulatory Visit: Payer: Medicaid Other

## 2022-11-07 ENCOUNTER — Ambulatory Visit: Payer: Medicaid Other

## 2022-11-07 ENCOUNTER — Telehealth: Payer: Self-pay

## 2022-11-07 NOTE — Telephone Encounter (Signed)
Called pt to discuss missed appt. LVM about next appt on Monday 10/14.

## 2022-11-12 ENCOUNTER — Ambulatory Visit: Payer: Medicaid Other

## 2022-11-12 DIAGNOSIS — G8929 Other chronic pain: Secondary | ICD-10-CM | POA: Diagnosis not present

## 2022-11-12 DIAGNOSIS — M5442 Lumbago with sciatica, left side: Secondary | ICD-10-CM | POA: Diagnosis not present

## 2022-11-12 DIAGNOSIS — M6281 Muscle weakness (generalized): Secondary | ICD-10-CM | POA: Diagnosis not present

## 2022-11-12 NOTE — Therapy (Addendum)
OUTPATIENT PHYSICAL THERAPY THORACOLUMBAR TREATMENT   Patient Name: Dawn Hunter MRN: [id number] DOB:02-Oct-1980, 42 y.o., female Today's Date: 11/12/2022  END OF SESSION:  PT End of Session - 11/12/22 0856     Visit Number 4    Number of Visits 13    Date for PT Re-Evaluation 11/21/22    PT Start Time 0859    PT Stop Time 0942    PT Time Calculation (min) 43 min    Activity Tolerance Patient tolerated treatment well;Patient limited by pain    Behavior During Therapy [facility] for tasks assessed/performed             Past Medical History:  Diagnosis Date   Anxiety 10/30/2013   Hypertension    Past Surgical History:  Procedure Laterality Date   CESAREAN SECTION     CHOLECYSTECTOMY  08/09/2013   TUBAL LIGATION     Patient Active Problem List   Diagnosis Date Noted   Swelling of left eyelid 08/10/2022   Arthropathy of left sacroiliac joint 02/01/2022   Greater trochanteric pain syndrome of left lower extremity 02/01/2022   Spondylosis of lumbosacral region without myelopathy or radiculopathy 01/26/2022   Cystitis 01/26/2022   Lumbar spondylosis 10/19/2021   Lumbar degenerative disc disease 10/19/2021   Chronic pain syndrome 10/19/2021   Pain in axilla 09/14/2021   Axillary tenderness 09/14/2021   Axillary mass, bilateral 09/01/2021   Need for Tdap vaccination 09/01/2021   Tinea corporis 06/01/2021   Annual physical exam 06/01/2021   Symptomatic mammary hypertrophy 05/04/2021   Left lumbosacral radiculopathy 05/04/2021   Hypertension 05/04/2021    PCP: Jerrol Banana, MD  REFERRING PROVIDER: Jerrol Banana, MD  REFERRING DIAG:  [id number] (ICD-10-CM) - Spondylosis of lumbosacral region without myelopathy or radiculopathy    Rationale for Evaluation and Treatment: Rehabilitation  THERAPY DIAG:  Chronic left-sided low back pain with left-sided sciatica  Muscle weakness (generalized)  ONSET DATE: 2016, most recent episode since November  2023  SUBJECTIVE:                                                                                                                                                                                           SUBJECTIVE STATEMENT: Pt reports a 4/10 pain in the LBP. Pt states her back pain has lowered over the weekend.   PERTINENT HISTORY:   Pt reports chronic low back pain w/ LLE N/T. This pain began with insidious onset around 2016 and this current episode has continued to worsen since November, 2023. Pain on the L sided lower back and into the L buttock area specifically around the L piriformis. Pain is  described as stabbing and throbbing. Radicular symptoms noted traveling down her posterior LLE along S1 dermatome. Pt's aggravating factors include prolonged walking, sitting, and standing and the pain is relieved with OTC medication, self massage of L lumbar paraspinals, and heating pad application. Pt's pain is worse in the morning compared to throughout the day.Pt has had a corticosteroid shot in her back to assist with her pain, but the this did not help much.   PAIN:  Are you having pain? Yes: NPRS scale: 4/10 Pain location: Left paraspinals> R paraspinals Pain description: Throbbing pain, stabbing,  Aggravating factors: Walking, standing, sitting (worse with extension)  Relieving factors: OTC medications, heat pad, massage Pain at worse is 10/10 during aggravating factors.     PRECAUTIONS: None  RED FLAGS: Bowel or bladder incontinence: No, Spinal tumors: No, and Cauda equina syndrome: No   WEIGHT BEARING RESTRICTIONS: No  FALLS:  Has patient fallen in last 6 months? No  OCCUPATION: Warehouse (lots of standing and walking)   PLOF: Independent  PATIENT GOALS: To be able to do ADL's pain- free  NEXT MD VISIT: October, 2024  OBJECTIVE:   DIAGNOSTIC FINDINGS:  DG Lumbar Spine Complete 05/05/2022  CLINICAL DATA:  Chronic low back pain, left side radiculopathy into calf,  progressively worsening.   EXAM: LUMBAR SPINE - COMPLETE 4+ VIEW   COMPARISON:  None.   FINDINGS: Five non-rib-bearing lumbar vertebral bodies. Lower lumbar spine facet arthropathy. Osteophyte formation along the thoracic spine. There is no evidence of lumbar spine fracture. Alignment is normal. Intervertebral disc spaces are maintained.   Right upper quadrant surgical clips. Phleboliths noted overlying the pelvis.   IMPRESSION: No acute displaced fracture or traumatic listhesis of the lumbar spine.    PATIENT SURVEYS:  FOTO 43/53  SCREENING FOR RED FLAGS: Bowel or bladder incontinence: No Spinal tumors: No Cauda equina syndrome: No Compression fracture: No Abdominal aneurysm: No  COGNITION: Overall cognitive status: Within functional limits for tasks assessed     SENSATION: WFL LQNS performed- negative   MUSCLE LENGTH: WFL  POSTURE: rounded shoulders and forward head  PALPATION: TTP around L lumbar paraspinals> R lumbar paraspinals, and around L piriformis/ gluteal musculature.   LUMBAR ROM:   AROM eval  Flexion 75%*  Extension 50%*  Right lateral flexion 75%  Left lateral flexion 50%*  Right rotation 75%  Left rotation 50%*   (Blank rows = not tested) *= concordant pain  LOWER EXTREMITY MMT:    MMT Right eval Left eval  Hip flexion 4+ 4  Hip extension 4- 4-  Hip abduction 4- 4-  Hip adduction 4- 4-  Hip internal rotation 4 4  Hip external rotation 4 4  Knee flexion 4+ 4+  Knee extension 4+ 4+  Ankle dorsiflexion 4+ 4+   (Blank rows = not tested)  LUMBAR SPECIAL TESTS:  Slump test: Negative, FABER test: Negative, and Long sit test: Negative  FADDIR: Negative FUNCTIONAL TESTS:  5 times sit to stand: 9.47sec 6 minute walk test: 1428.16ft  JOINT MOBILITY: L1- L5 hypomobility noted w/ PAM's, w/ concordant pain in the throughout all lumbar segments.   TODAY'S TREATMENT:  DATE: 11/12/22  There.ex:  Nustep x5 minutes lvl 3.0 seat 8  NMES for low back during today's session on [employer] system with 2x2 x4 electrodes placed superiorly and inferiorly on bilat aspects of the lumbar paraspinal areas. Interferential current 1 Hz beat low 10 Hz beat high, mA range from 3.2-5.4 for pain modulation. 3 x10 prone press ups completed with NMES. Will continue to attempt at following sessions. 15 minutes spent with this set-up and treatment.   RDL's w/ 6# DB onto 8" step 2 x10 (multimodal cueing needed, good carryover)  Standing paloff press w/ green TB 2 x10/ each side  Standing lumbar rotations w/ 6# DB x8/ each side  Standing lumbar lateral flexion w/ 6# DB x8/ each side Standing D2 flexion w/ GTB x10/ each side  Blue physioball rollouts w/ trunk flexion forward, lateral flexion R/L x10/ each direction  PATIENT EDUCATION:  Education details: HEP given to pt Person educated: Patient Education method: Programmer, multimedia, Facilities manager, and Handouts Education comprehension: verbalized understanding and returned demonstration  HOME EXERCISE PROGRAM: Access Code: [email or web address] URL: [email or web address] Date: 10/10/2022 Prepared by: Loralyn Freshwater  Exercises - Supine Piriformis Stretch with Foot on Ground  - 1-3 x daily - 7 x weekly - 1 sets - 3 reps - 30 seconds  hold - supine single knee to chest position with hip extension isometrics  - 1-3 x daily - 7 x weekly - 2 sets - 8 reps - Prone Hip Extension  - 1 x daily - 5-7 x weekly - 2 sets - 8 reps - Sidelying Hip Abduction  - 1 x daily - 5-7 x weekly - 2 sets - 8 reps  ASSESSMENT:  CLINICAL IMPRESSION: Session focused on progressing lumbar strength and ROM. Pt presents w/ decreased irritability of sx at today's session. Interferential NMES was trialed at today's session for pain modulation (see above). Pt tolerated treatment well, noting no increase in  pain levels and improved tolerance to exercises during and following NMES treatment. Pt continues to note decreased lumbar mobility specifically with L lateral lumbar flexion and rotation. Pt will continue to benefit from skilled PT interventions to address remaining lumbar strength, mobility and pain deficits.    OBJECTIVE IMPAIRMENTS: decreased mobility, decreased strength, hypomobility, and pain.   ACTIVITY LIMITATIONS: lifting, bending, sitting, and standing  PARTICIPATION LIMITATIONS: community activity and work duties  PERSONAL FACTORS: Age and Time since onset of injury/illness/exacerbation are also affecting patient's functional outcome.   REHAB POTENTIAL: Good  CLINICAL DECISION MAKING: Stable/uncomplicated  EVALUATION COMPLEXITY: low   GOALS: Goals reviewed with patient? Yes  SHORT TERM GOALS: Target date: 10/31/22  Pt will be independent with HEP to improve hip and core strength and decrease pain with functional activities  Baseline:10/10/22: HEP given to pt Goal status: INITIAL  LONG TERM GOALS: Target date: 11/21/22  Pt will improve FOTO to target score to demonstrate clinically significant improvement in functional mobility.  Baseline: 10/10/22: 43/53 Goal status: INITIAL  2. Pt will decrease worst back pain as reported NPS by at least 2 points in order to demonstrate clinically significant reduction in back pain and improve ability to perform tasks which involve standing, walking and sitting more comfortably.   Baseline: 10/10/22: worst LBP 10/10 w/ activity Goal status: INITIAL  3.  Pt will increase 5xSTS to <12 seconds to display clinically significant improvements in LE functional strength and improve pt's IND w/ ADL's.  Baseline: 10/10/22: 9.47 sec Goal status: MET    4.  Pt will increase 6  MWT by > 165' to display improvements in functional endurance with community ambulation w/ < 6/10 NPS LBP. Baseline: 10/10/22: deferred to next session. 10/24/22:  1428.50ft   Goal status: INITIAL  PLAN:  PT FREQUENCY: 2x/week  PT DURATION: 6 weeks  PLANNED INTERVENTIONS: Therapeutic exercises, Therapeutic activity, Neuromuscular re-education, Balance training, Gait training, Patient/Family education, Self Care, Joint mobilization, Stair training, Dry Needling, Spinal manipulation, Spinal mobilization, Cryotherapy, Moist heat, Manual therapy, and Re-evaluation.  PLAN FOR NEXT SESSION: Assess tolerance to TENS for pain modulation at last session. Progress core and hip strengthening activities  Lovie Macadamia, SPT  Delphia Grates. Fairly IV, PT, DPT Physical Therapist- [place]  [facility], [place]  11/12/2022, 12:44 PM

## 2022-11-14 ENCOUNTER — Ambulatory Visit: Payer: Medicaid Other

## 2022-11-14 DIAGNOSIS — M6281 Muscle weakness (generalized): Secondary | ICD-10-CM | POA: Diagnosis not present

## 2022-11-14 DIAGNOSIS — G8929 Other chronic pain: Secondary | ICD-10-CM | POA: Diagnosis not present

## 2022-11-14 DIAGNOSIS — M5442 Lumbago with sciatica, left side: Secondary | ICD-10-CM | POA: Diagnosis not present

## 2022-11-14 NOTE — Therapy (Addendum)
OUTPATIENT PHYSICAL THERAPY THORACOLUMBAR TREATMENT   Patient Name: Dawn Hunter MRN: [id number] DOB:09/25/1980, 42 y.o., female Today's Date: 11/14/2022  END OF SESSION:  PT End of Session - 11/14/22 1027     Visit Number 5    Number of Visits 13    Date for PT Re-Evaluation 11/21/22    PT Start Time 1031    PT Stop Time 1114    PT Time Calculation (min) 43 min    Activity Tolerance Patient tolerated treatment well;Patient limited by pain    Behavior During Therapy [facility] for tasks assessed/performed             Past Medical History:  Diagnosis Date   Anxiety 10/30/2013   Hypertension    Past Surgical History:  Procedure Laterality Date   CESAREAN SECTION     CHOLECYSTECTOMY  08/09/2013   TUBAL LIGATION     Patient Active Problem List   Diagnosis Date Noted   Swelling of left eyelid 08/10/2022   Arthropathy of left sacroiliac joint 02/01/2022   Greater trochanteric pain syndrome of left lower extremity 02/01/2022   Spondylosis of lumbosacral region without myelopathy or radiculopathy 01/26/2022   Cystitis 01/26/2022   Lumbar spondylosis 10/19/2021   Lumbar degenerative disc disease 10/19/2021   Chronic pain syndrome 10/19/2021   Pain in axilla 09/14/2021   Axillary tenderness 09/14/2021   Axillary mass, bilateral 09/01/2021   Need for Tdap vaccination 09/01/2021   Tinea corporis 06/01/2021   Annual physical exam 06/01/2021   Symptomatic mammary hypertrophy 05/04/2021   Left lumbosacral radiculopathy 05/04/2021   Hypertension 05/04/2021    PCP: Jerrol Banana, MD  REFERRING PROVIDER: Jerrol Banana, MD  REFERRING DIAG:  [phone] (ICD-10-CM) - Spondylosis of lumbosacral region without myelopathy or radiculopathy    Rationale for Evaluation and Treatment: Rehabilitation  THERAPY DIAG:  Chronic left-sided low back pain with left-sided sciatica  Muscle weakness (generalized)  ONSET DATE: 2016, most recent episode since November  2023  SUBJECTIVE:                                                                                                                                                                                           SUBJECTIVE STATEMENT: Pt reports a 4/10 pain in the LBP. No notable changes since last session.   PERTINENT HISTORY:   Pt reports chronic low back pain w/ LLE N/T. This pain began with insidious onset around 2016 and this current episode has continued to worsen since November, 2023. Pain on the L sided lower back and into the L buttock area specifically around the L piriformis. Pain is described as stabbing and  throbbing. Radicular symptoms noted traveling down her posterior LLE along S1 dermatome. Pt's aggravating factors include prolonged walking, sitting, and standing and the pain is relieved with OTC medication, self massage of L lumbar paraspinals, and heating pad application. Pt's pain is worse in the morning compared to throughout the day.Pt has had a corticosteroid shot in her back to assist with her pain, but the this did not help much.   PAIN:  Are you having pain? Yes: NPRS scale: 4/10 Pain location: Left paraspinals> R paraspinals Pain description: Throbbing pain, stabbing,  Aggravating factors: Walking, standing, sitting (worse with extension)  Relieving factors: OTC medications, heat pad, massage Pain at worse is 10/10 during aggravating factors.     PRECAUTIONS: None  RED FLAGS: Bowel or bladder incontinence: No, Spinal tumors: No, and Cauda equina syndrome: No   WEIGHT BEARING RESTRICTIONS: No  FALLS:  Has patient fallen in last 6 months? No  OCCUPATION: Warehouse (lots of standing and walking)   PLOF: Independent  PATIENT GOALS: To be able to do ADL's pain- free  NEXT MD VISIT: October, 2024  OBJECTIVE:   DIAGNOSTIC FINDINGS:  DG Lumbar Spine Complete 05/05/2022  CLINICAL DATA:  Chronic low back pain, left side radiculopathy into calf, progressively  worsening.   EXAM: LUMBAR SPINE - COMPLETE 4+ VIEW   COMPARISON:  None.   FINDINGS: Five non-rib-bearing lumbar vertebral bodies. Lower lumbar spine facet arthropathy. Osteophyte formation along the thoracic spine. There is no evidence of lumbar spine fracture. Alignment is normal. Intervertebral disc spaces are maintained.   Right upper quadrant surgical clips. Phleboliths noted overlying the pelvis.   IMPRESSION: No acute displaced fracture or traumatic listhesis of the lumbar spine.    PATIENT SURVEYS:  FOTO 43/53  SCREENING FOR RED FLAGS: Bowel or bladder incontinence: No Spinal tumors: No Cauda equina syndrome: No Compression fracture: No Abdominal aneurysm: No  COGNITION: Overall cognitive status: Within functional limits for tasks assessed     SENSATION: WFL LQNS performed- negative   MUSCLE LENGTH: WFL  POSTURE: rounded shoulders and forward head  PALPATION: TTP around L lumbar paraspinals> R lumbar paraspinals, and around L piriformis/ gluteal musculature.   LUMBAR ROM:   AROM eval  Flexion 75%*  Extension 50%*  Right lateral flexion 75%  Left lateral flexion 50%*  Right rotation 75%  Left rotation 50%*   (Blank rows = not tested) *= concordant pain  LOWER EXTREMITY MMT:    MMT Right eval Left eval  Hip flexion 4+ 4  Hip extension 4- 4-  Hip abduction 4- 4-  Hip adduction 4- 4-  Hip internal rotation 4 4  Hip external rotation 4 4  Knee flexion 4+ 4+  Knee extension 4+ 4+  Ankle dorsiflexion 4+ 4+   (Blank rows = not tested)  LUMBAR SPECIAL TESTS:  Slump test: Negative, FABER test: Negative, and Long sit test: Negative  FADDIR: Negative FUNCTIONAL TESTS:  5 times sit to stand: 9.47sec 6 minute walk test: 1428.70ft  JOINT MOBILITY: L1- L5 hypomobility noted w/ PAM's, w/ concordant pain in the throughout all lumbar segments.   TODAY'S TREATMENT:  DATE: 11/14/22  There.ex:  Nustep x5 minutes lvl 3.0 seat 8  NMES for low back during today's session on [employer] system with 2x2 x4 electrodes placed superiorly and inferiorly on bilat aspects of the lumbar paraspinal areas. Interferential current 1 Hz beat low 10 Hz beat high, mA range from 3.8-6.4 for pain modulation. Will continue to attempt at following sessions. 15 minutes spent with this set-up and treatment.   Completed with NMES:  Prone press ups 3 x10  Supine LTR's x15   Supine glute bridges x10   Clear physioball rollouts w/ trunk flexion forward, lateral flexion R/L x10/ each direction Standing paloff press w/ blue TB 2 x10/ each side  RDL's w/ 10# KB onto 8" step 2 x10 (multimodal cueing needed, good carryover)  Standing paloff press w/ blue TB 2 x10/ each side  Standing lumbar extensions x5   PATIENT EDUCATION:  Education details: HEP given to pt Person educated: Patient Education method: Explanation, Demonstration, and Handouts Education comprehension: verbalized understanding and returned demonstration  HOME EXERCISE PROGRAM: Access Code: [email or web address] URL: [email or web address] Date: 10/10/2022 Prepared by: Loralyn Freshwater  Exercises - Supine Piriformis Stretch with Foot on Ground  - 1-3 x daily - 7 x weekly - 1 sets - 3 reps - 30 seconds  hold - supine single knee to chest position with hip extension isometrics  - 1-3 x daily - 7 x weekly - 2 sets - 8 reps - Prone Hip Extension  - 1 x daily - 5-7 x weekly - 2 sets - 8 reps - Sidelying Hip Abduction  - 1 x daily - 5-7 x weekly - 2 sets - 8 reps  ASSESSMENT:  CLINICAL IMPRESSION: Session focused on progressing lumbar strength and ROM. Pt presents with decreased irritability of sx at today's session. Interferential NMES was completed again, noting a reduction in muscle tightness self reported by the pt. Pt notes improvements in lumbar mobility and core  strengthening displayed with increased resistance utilized during exercises and improved lumbar ext AROM w/o concordant pain. Pt's LBP continues to be a limiting factor to pt's functional mobility following activities at today's visit. Pt will continue to benefit from skilled PT interventions to address remaining lumbar strength, mobility and pain deficits.   OBJECTIVE IMPAIRMENTS: decreased mobility, decreased strength, hypomobility, and pain.   ACTIVITY LIMITATIONS: lifting, bending, sitting, and standing  PARTICIPATION LIMITATIONS: community activity and work duties  PERSONAL FACTORS: Age and Time since onset of injury/illness/exacerbation are also affecting patient's functional outcome.   REHAB POTENTIAL: Good  CLINICAL DECISION MAKING: Stable/uncomplicated  EVALUATION COMPLEXITY: low   GOALS: Goals reviewed with patient? Yes  SHORT TERM GOALS: Target date: 10/31/22  Pt will be independent with HEP to improve hip and core strength and decrease pain with functional activities  Baseline:10/10/22: HEP given to pt Goal status: INITIAL  LONG TERM GOALS: Target date: 11/21/22  Pt will improve FOTO to target score to demonstrate clinically significant improvement in functional mobility.  Baseline: 10/10/22: 43/53 Goal status: INITIAL  2. Pt will decrease worst back pain as reported NPS by at least 2 points in order to demonstrate clinically significant reduction in back pain and improve ability to perform tasks which involve standing, walking and sitting more comfortably.   Baseline: 10/10/22: worst LBP 10/10 w/ activity Goal status: INITIAL  3.  Pt will increase 5xSTS to <12 seconds to display clinically significant improvements in LE functional strength and improve pt's IND w/ ADL's.  Baseline: 10/10/22: 9.47 sec Goal status: MET  4.  Pt will increase 6 MWT by > 165' to display improvements in functional endurance with community ambulation w/ < 6/10 NPS LBP. Baseline: 10/10/22:  deferred to next session. 10/24/22: 1428.96ft   Goal status: INITIAL  PLAN:  PT FREQUENCY: 2x/week  PT DURATION: 6 weeks  PLANNED INTERVENTIONS: Therapeutic exercises, Therapeutic activity, Neuromuscular re-education, Balance training, Gait training, Patient/Family education, Self Care, Joint mobilization, Stair training, Dry Needling, Spinal manipulation, Spinal mobilization, Cryotherapy, Moist heat, Manual therapy, and Re-evaluation.  PLAN FOR NEXT SESSION: RECERT Continue interferential NMES. Progress core/ lumbar and hip strengthening activities  Lovie Macadamia, SPT  Delphia Grates. Fairly IV, PT, DPT Physical Therapist- [place]  [facility]  11/14/2022, 12:59 PM

## 2022-11-15 ENCOUNTER — Ambulatory Visit (INDEPENDENT_AMBULATORY_CARE_PROVIDER_SITE_OTHER): Payer: Medicaid Other | Admitting: Family Medicine

## 2022-11-15 VITALS — BP 122/70 | HR 89 | Ht 68.0 in | Wt 222.0 lb

## 2022-11-15 DIAGNOSIS — B372 Candidiasis of skin and nail: Secondary | ICD-10-CM

## 2022-11-15 DIAGNOSIS — E559 Vitamin D deficiency, unspecified: Secondary | ICD-10-CM

## 2022-11-15 DIAGNOSIS — Z131 Encounter for screening for diabetes mellitus: Secondary | ICD-10-CM | POA: Diagnosis not present

## 2022-11-15 DIAGNOSIS — Z13220 Encounter for screening for lipoid disorders: Secondary | ICD-10-CM | POA: Diagnosis not present

## 2022-11-15 DIAGNOSIS — I10 Essential (primary) hypertension: Secondary | ICD-10-CM | POA: Diagnosis not present

## 2022-11-15 DIAGNOSIS — M75102 Unspecified rotator cuff tear or rupture of left shoulder, not specified as traumatic: Secondary | ICD-10-CM

## 2022-11-15 DIAGNOSIS — Z0000 Encounter for general adult medical examination without abnormal findings: Secondary | ICD-10-CM | POA: Diagnosis not present

## 2022-11-15 DIAGNOSIS — M75101 Unspecified rotator cuff tear or rupture of right shoulder, not specified as traumatic: Secondary | ICD-10-CM

## 2022-11-15 DIAGNOSIS — N760 Acute vaginitis: Secondary | ICD-10-CM | POA: Diagnosis not present

## 2022-11-15 MED ORDER — METRONIDAZOLE 500 MG PO TABS
500.0000 mg | ORAL_TABLET | Freq: Two times a day (BID) | ORAL | 0 refills | Status: AC
Start: 2022-11-15 — End: 2022-11-22

## 2022-11-15 MED ORDER — CLOTRIMAZOLE-BETAMETHASONE 1-0.05 % EX CREA
TOPICAL_CREAM | CUTANEOUS | 1 refills | Status: DC
Start: 2022-11-15 — End: 2023-03-18

## 2022-11-15 MED ORDER — FLUCONAZOLE 200 MG PO TABS: 200.0000 mg | ORAL_TABLET | ORAL | 0 refills | Status: DC

## 2022-11-15 NOTE — Progress Notes (Unsigned)
Annual Physical Exam Visit  Patient Information:  Patient ID: Dawn Hunter, female DOB: 02-27-1980 Age: 42 y.o. MRN: [id number]   Subjective:   CC: Annual Physical Exam  HPI:  Dawn Hunter is here for their annual physical.  I reviewed the past medical history, family history, social history, surgical history, and allergies today and changes were made as necessary.  Please see the problem list section below for additional details.  Past Medical History: Past Medical History:  Diagnosis Date   Anxiety 10/30/2013   Hypertension    Past Surgical History: Past Surgical History:  Procedure Laterality Date   CESAREAN SECTION     CHOLECYSTECTOMY  08/09/2013   TUBAL LIGATION     Family History: Family History  Problem Relation Age of Onset   Hypertension Mother    Arthritis Mother    Hypertension Father    Diabetes Father    Hypertension Maternal Grandmother    Diabetes Maternal Grandmother    Hypertension Maternal Grandfather    Hypertension Paternal Grandmother    Diabetes Paternal Grandmother    Cancer Paternal Grandmother        Throat   Hypertension Paternal Grandfather    Cancer Paternal Grandfather    Breast cancer Cousin    Hypertension Son    Allergies: No Known Allergies Health Maintenance: Health Maintenance  Topic Date Due   INFLUENZA VACCINE  04/29/2023 (Originally 08/30/2022)   Cervical Cancer Screening (HPV/Pap Cotest)  06/01/2024   DTaP/Tdap/Td (2 - Td or Tdap) 09/02/2031   Hepatitis C Screening  Completed   HIV Screening  Completed   HPV VACCINES  Aged Out   COVID-19 Vaccine  Discontinued    HM Colonoscopy     This patient has no relevant Health Maintenance data.      Medications: No current outpatient medications on file prior to visit.   No current facility-administered medications on file prior to visit.    Objective:   Vitals:   11/15/22 0956  BP: 122/70  Pulse: 89  SpO2: 96%   Vitals:   11/15/22 0956  Weight: 222  lb (100.7 kg)  Height: 5\' 8"  (1.727 m)   Body mass index is 33.75 kg/m.  General: Well Developed, well nourished, and in no acute distress.  Neuro: Alert and oriented x3, extra-ocular muscles intact, sensation grossly intact. Cranial nerves II through XII are grossly intact, motor, sensory, and coordinative functions are intact. HEENT: Normocephalic, atraumatic, neck supple, no masses, no lymphadenopathy, thyroid nonenlarged. Oropharynx, nasopharynx, external ear canals are unremarkable. Skin: Warm and dry, mildly macerated and hyperpigmented appearance of the skin at the inframamary folds and intermammary cleft. Cardiac: Regular rate and rhythm, no murmurs rubs or gallops. No peripheral edema. Pulses symmetric. Respiratory: Clear to auscultation bilaterally. Speaking in full sentences.  Abdominal: Soft, nontender, nondistended, positive bowel sounds, no masses, no organomegaly. Musculoskeletal: Stable, and with full painful range of motion. +empty can, +Neer's, +Hawkin's  Female chaperone initials: KP present throughout the physical examination of the breasts.  Impression and Recommendations:   The patient was counselled, risk factors were discussed, and anticipatory guidance given.  Problem List Items Addressed This Visit       Cardiovascular and Mediastinum   Hypertension    Controlled with lifestyle interventions alone at this stage.        Musculoskeletal and Integument   Yeast dermatitis    Chronic recurrent rash around and in between breasts, consistent with yeast dermatitis. Did trial OTC steroid topical with some improvement.  Plan: - Modify treatment to Lotrisone      Relevant Medications   clotrimazole-betamethasone (LOTRISONE) cream   metroNIDAZOLE (FLAGYL) 500 MG tablet   fluconazole (DIFLUCAN) 200 MG tablet   Bilateral rotator cuff syndrome    Noted in the setting of mammary hypertrophy and associated kyphotic postural changes. No strength deficits noted, she  has focality to the supraspinatus bilaterally and positive impingement. She is already enrolled in PT and noting benefit.  Plan: - New PT referral for bilateral shoulder for adjunct treatment - If still symptomatic, patient advised to schedule follow-up, can consider CSI at that time      Relevant Orders   Ambulatory referral to Physical Therapy     Genitourinary   Acute vaginitis    Acute vaginal itching, question odor change, history in line with BV and patient states symptoms are consistent with her prior BV infection.  Plan: - Flagyl course - Adjunct Diflucan as patient is prone to yeast vaginitis with antibiotics      Relevant Medications   metroNIDAZOLE (FLAGYL) 500 MG tablet     Other   Healthcare maintenance - Primary    Annual examination completed, risk stratification labs ordered, anticipatory guidance provided.  We will follow labs once resulted.      Relevant Orders   Comprehensive metabolic panel (Completed)   CBC (Completed)   Hemoglobin A1c (Completed)   VITAMIN D 25 Hydroxy (Vit-D Deficiency, Fractures) (Completed)   TSH (Completed)   Lipid panel (Completed)   Other Visit Diagnoses     Screening for lipoid disorders       Relevant Orders   Comprehensive metabolic panel (Completed)   Lipid panel (Completed)   Vitamin D deficiency       Relevant Orders   VITAMIN D 25 Hydroxy (Vit-D Deficiency, Fractures) (Completed)   Screening for diabetes mellitus       Relevant Orders   Comprehensive metabolic panel (Completed)   Hemoglobin A1c (Completed)        Orders & Medications Medications:  Meds ordered this encounter  Medications   clotrimazole-betamethasone (LOTRISONE) cream    Sig: Apply a thin layer to the affected area twice daily (morning and evening) for up to 2 weeks.    Dispense:  45 g    Refill:  1   metroNIDAZOLE (FLAGYL) 500 MG tablet    Sig: Take 1 tablet (500 mg total) by mouth 2 (two) times daily for 7 days.    Dispense:  14 tablet     Refill:  0   fluconazole (DIFLUCAN) 200 MG tablet    Sig: Take 1 tablet (200 mg total) by mouth once a week.    Dispense:  4 tablet    Refill:  0   Orders Placed This Encounter  Procedures   Comprehensive metabolic panel   CBC   Hemoglobin A1c   VITAMIN D 25 Hydroxy (Vit-D Deficiency, Fractures)   TSH   Lipid panel   Ambulatory referral to Physical Therapy     No follow-ups on file.    Jerrol Banana, MD, [facility]   Primary Care Sports Medicine Primary Care and Sports Medicine at [facility]

## 2022-11-16 ENCOUNTER — Encounter: Payer: Self-pay | Admitting: Family Medicine

## 2022-11-16 DIAGNOSIS — B372 Candidiasis of skin and nail: Secondary | ICD-10-CM | POA: Insufficient documentation

## 2022-11-16 DIAGNOSIS — N760 Acute vaginitis: Secondary | ICD-10-CM | POA: Insufficient documentation

## 2022-11-16 DIAGNOSIS — M75101 Unspecified rotator cuff tear or rupture of right shoulder, not specified as traumatic: Secondary | ICD-10-CM | POA: Insufficient documentation

## 2022-11-16 LAB — COMPREHENSIVE METABOLIC PANEL
ALT: 14 [IU]/L (ref 0–32)
AST: 15 [IU]/L (ref 0–40)
Albumin: 4.4 g/dL (ref 3.9–4.9)
Alkaline Phosphatase: 68 [IU]/L (ref 44–121)
BUN/Creatinine Ratio: 10 (ref 9–23)
BUN: 8 mg/dL (ref 6–24)
Bilirubin Total: 0.6 mg/dL (ref 0.0–1.2)
CO2: 24 mmol/L (ref 20–29)
Calcium: 9.3 mg/dL (ref 8.7–10.2)
Chloride: 103 mmol/L (ref 96–106)
Creatinine, Ser: 0.78 mg/dL (ref 0.57–1.00)
Globulin, Total: 2.1 g/dL (ref 1.5–4.5)
Glucose: 86 mg/dL (ref 70–99)
Potassium: 4.7 mmol/L (ref 3.5–5.2)
Sodium: 140 mmol/L (ref 134–144)
Total Protein: 6.5 g/dL (ref 6.0–8.5)
eGFR: 97 mL/min/{1.73_m2} (ref >59)

## 2022-11-16 LAB — CBC
Hematocrit: 39 % (ref 34.0–46.6)
Hemoglobin: 13.2 g/dL (ref 11.1–15.9)
MCH: 32.3 pg (ref 26.6–33.0)
MCHC: 33.8 g/dL (ref 31.5–35.7)
MCV: 95 fL (ref 79–97)
Platelets: 333 10*3/uL (ref 150–450)
RBC: 4.09 x10E6/uL (ref 3.77–5.28)
RDW: 12.9 % (ref 11.7–15.4)
WBC: 6.8 10*3/uL (ref 3.4–10.8)

## 2022-11-16 LAB — HEMOGLOBIN A1C
Est. average glucose Bld gHb Est-mCnc: 128 mg/dL
Hgb A1c MFr Bld: 6.1 % — ABNORMAL HIGH (ref 4.8–5.6)

## 2022-11-16 LAB — LIPID PANEL
Chol/HDL Ratio: 3.4 {ratio} (ref 0.0–4.4)
Cholesterol, Total: 189 mg/dL (ref 100–199)
HDL: 55 mg/dL (ref >39)
LDL Chol Calc (NIH): 118 mg/dL — ABNORMAL HIGH (ref 0–99)
Triglycerides: 87 mg/dL (ref 0–149)
VLDL Cholesterol Cal: 16 mg/dL (ref 5–40)

## 2022-11-16 LAB — TSH: TSH: 1.83 u[IU]/mL (ref 0.450–4.500)

## 2022-11-16 LAB — VITAMIN D 25 HYDROXY (VIT D DEFICIENCY, FRACTURES): Vit D, 25-Hydroxy: 16.6 ng/mL — ABNORMAL LOW (ref 30.0–100.0)

## 2022-11-16 NOTE — Patient Instructions (Signed)
-   Obtain fasting labs with orders provided (can have water or black coffee but otherwise no food or drink x 8 hours before labs) - Review information provided - Attend eye doctor annually, dentist every 6 months, work towards or maintain 30 minutes of moderate intensity physical activity at least 5 days per week, and consume a balanced diet - Return in 1 year for physical - Contact us for any questions between now and then  Additionally: - New PT referral placed to add shoulders to your current PT - Use topical prescription cream as prescribed no more than 2 weeks - Take antibiotic pill for full course - Contact gynecology to schedule follow-up

## 2022-11-16 NOTE — Assessment & Plan Note (Signed)
Controlled with lifestyle interventions alone at this stage.

## 2022-11-16 NOTE — Assessment & Plan Note (Signed)
Chronic recurrent rash around and in between breasts, consistent with yeast dermatitis. Did trial OTC steroid topical with some improvement.   Plan: - Modify treatment to Lotrisone

## 2022-11-16 NOTE — Assessment & Plan Note (Signed)
Acute vaginal itching, question odor change, history in line with BV and patient states symptoms are consistent with her prior BV infection.  Plan: - Flagyl course - Adjunct Diflucan as patient is prone to yeast vaginitis with antibiotics

## 2022-11-16 NOTE — Assessment & Plan Note (Signed)
Noted in the setting of mammary hypertrophy and associated kyphotic postural changes. No strength deficits noted, she has focality to the supraspinatus bilaterally and positive impingement. She is already enrolled in PT and noting benefit.  Plan: - New PT referral for bilateral shoulder for adjunct treatment - If still symptomatic, patient advised to schedule follow-up, can consider CSI at that time

## 2022-11-16 NOTE — Assessment & Plan Note (Signed)
Annual examination completed, risk stratification labs ordered, anticipatory guidance provided.  We will follow labs once resulted. 

## 2022-11-20 ENCOUNTER — Other Ambulatory Visit: Payer: Self-pay | Admitting: Family Medicine

## 2022-11-20 DIAGNOSIS — E559 Vitamin D deficiency, unspecified: Secondary | ICD-10-CM

## 2022-11-20 MED ORDER — VITAMIN D (ERGOCALCIFEROL) 1.25 MG (50000 UNIT) PO CAPS
50000.0000 [IU] | ORAL_CAPSULE | ORAL | 0 refills | Status: DC
Start: 2022-11-20 — End: 2023-03-18

## 2022-11-21 ENCOUNTER — Ambulatory Visit: Payer: Medicaid Other

## 2022-11-21 DIAGNOSIS — G8929 Other chronic pain: Secondary | ICD-10-CM | POA: Diagnosis not present

## 2022-11-21 DIAGNOSIS — M5442 Lumbago with sciatica, left side: Secondary | ICD-10-CM | POA: Diagnosis not present

## 2022-11-21 DIAGNOSIS — M6281 Muscle weakness (generalized): Secondary | ICD-10-CM

## 2022-11-21 NOTE — Therapy (Addendum)
OUTPATIENT PHYSICAL THERAPY THORACOLUMBAR TREATMENT/ RE-CERTIFICATION   Patient Name: Dawn Hunter MRN: [id number] DOB:Apr 07, 1980, 42 y.o., female Today's Date: 11/21/2022  END OF SESSION:  PT End of Session - 11/21/22 1030     Visit Number 6    Number of Visits 13    Date for PT Re-Evaluation 01/02/23    PT Start Time 1030    PT Stop Time 1113    PT Time Calculation (min) 43 min    Activity Tolerance Patient tolerated treatment well;Patient limited by pain    Behavior During Therapy [facility] for tasks assessed/performed             Past Medical History:  Diagnosis Date   Anxiety 10/30/2013   Hypertension    Past Surgical History:  Procedure Laterality Date   CESAREAN SECTION     CHOLECYSTECTOMY  08/09/2013   TUBAL LIGATION     Patient Active Problem List   Diagnosis Date Noted   Acute vaginitis 11/16/2022   Yeast dermatitis 11/16/2022   Bilateral rotator cuff syndrome 11/16/2022   Arthropathy of left sacroiliac joint 02/01/2022   Greater trochanteric pain syndrome of left lower extremity 02/01/2022   Spondylosis of lumbosacral region without myelopathy or radiculopathy 01/26/2022   Lumbar spondylosis 10/19/2021   Lumbar degenerative disc disease 10/19/2021   Chronic pain syndrome 10/19/2021   Axillary tenderness 09/14/2021   Need for Tdap vaccination 09/01/2021   Pain in axilla 09/01/2021   Healthcare maintenance 06/01/2021   Symptomatic mammary hypertrophy 05/04/2021   Left lumbosacral radiculopathy 05/04/2021   Hypertension 05/04/2021    PCP: Jerrol Banana, MD  REFERRING PROVIDER: Jerrol Banana, MD  REFERRING DIAG:  [phone] (ICD-10-CM) - Spondylosis of lumbosacral region without myelopathy or radiculopathy    Rationale for Evaluation and Treatment: Rehabilitation  THERAPY DIAG:  Chronic left-sided low back pain with left-sided sciatica  Muscle weakness (generalized)  ONSET DATE: 2016, most recent episode since November  2023  SUBJECTIVE:                                                                                                                                                                                           SUBJECTIVE STATEMENT: Pt reports a 4/10 pain in the LBP. Pt reports she followed up with her MD, and he brought up potentially receiving a breast reduction surgery to help reduce her low back pain.   PERTINENT HISTORY:   Pt reports chronic low back pain w/ LLE N/T. This pain began with insidious onset around 2016 and this current episode has continued to worsen since November, 2023. Pain on the L sided lower back and into the  L buttock area specifically around the L piriformis. Pain is described as stabbing and throbbing. Radicular symptoms noted traveling down her posterior LLE along S1 dermatome. Pt's aggravating factors include prolonged walking, sitting, and standing and the pain is relieved with OTC medication, self massage of L lumbar paraspinals, and heating pad application. Pt's pain is worse in the morning compared to throughout the day.Pt has had a corticosteroid shot in her back to assist with her pain, but the this did not help much.   PAIN:  Are you having pain? Yes: NPRS scale: 4/10 Pain location: Left paraspinals> R paraspinals Pain description: Throbbing pain, stabbing,  Aggravating factors: Walking, standing, sitting (worse with extension)  Relieving factors: OTC medications, heat pad, massage Pain at worse is 10/10 during aggravating factors.     PRECAUTIONS: None  RED FLAGS: Bowel or bladder incontinence: No, Spinal tumors: No, and Cauda equina syndrome: No   WEIGHT BEARING RESTRICTIONS: No  FALLS:  Has patient fallen in last 6 months? No  OCCUPATION: Warehouse (lots of standing and walking)   PLOF: Independent  PATIENT GOALS: To be able to do ADL's pain- free  NEXT MD VISIT: October, 2024  OBJECTIVE:   DIAGNOSTIC FINDINGS:  DG Lumbar Spine Complete  05/05/2022  CLINICAL DATA:  Chronic low back pain, left side radiculopathy into calf, progressively worsening.   EXAM: LUMBAR SPINE - COMPLETE 4+ VIEW   COMPARISON:  None.   FINDINGS: Five non-rib-bearing lumbar vertebral bodies. Lower lumbar spine facet arthropathy. Osteophyte formation along the thoracic spine. There is no evidence of lumbar spine fracture. Alignment is normal. Intervertebral disc spaces are maintained.   Right upper quadrant surgical clips. Phleboliths noted overlying the pelvis.   IMPRESSION: No acute displaced fracture or traumatic listhesis of the lumbar spine.    PATIENT SURVEYS:  FOTO 43/53  SCREENING FOR RED FLAGS: Bowel or bladder incontinence: No Spinal tumors: No Cauda equina syndrome: No Compression fracture: No Abdominal aneurysm: No  COGNITION: Overall cognitive status: Within functional limits for tasks assessed     SENSATION: WFL LQNS performed- negative   MUSCLE LENGTH: WFL  POSTURE: rounded shoulders and forward head  PALPATION: TTP around L lumbar paraspinals> R lumbar paraspinals, and around L piriformis/ gluteal musculature.   LUMBAR ROM:   AROM eval  Flexion 75%*  Extension 50%*  Right lateral flexion 75%  Left lateral flexion 50%*  Right rotation 75%  Left rotation 50%*   (Blank rows = not tested) *= concordant pain  LOWER EXTREMITY MMT:    MMT Right eval Left eval  Hip flexion 4+ 4  Hip extension 4- 4-  Hip abduction 4- 4-  Hip adduction 4- 4-  Hip internal rotation 4 4  Hip external rotation 4 4  Knee flexion 4+ 4+  Knee extension 4+ 4+  Ankle dorsiflexion 4+ 4+   (Blank rows = not tested)  LUMBAR SPECIAL TESTS:  Slump test: Negative, FABER test: Negative, and Long sit test: Negative  FADDIR: Negative FUNCTIONAL TESTS:  5 times sit to stand: 9.47sec 6 minute walk test: 1428.48ft  JOINT MOBILITY: L1- L5 hypomobility noted w/ PAM's, w/ concordant pain in the throughout all lumbar segments.    TODAY'S TREATMENT:  DATE: 11/21/22  Beginning of session spent reassessing pt's goals and POC to complete re-cert. (See below)   There.ex:  Nustep x5 minutes lvl 3.0 seat 8 Modified sit-ups from wedge bolster 2 x10  Standing paloff press w/ blue TB 2 x6/ each side   PATIENT EDUCATION:  Education details: HEP given to pt Person educated: Patient Education method: Explanation, Demonstration, and Handouts Education comprehension: verbalized understanding and returned demonstration  HOME EXERCISE PROGRAM: Access Code: [email or web address] URL: [email or web address] Date: 10/10/2022 Prepared by: Loralyn Freshwater  Exercises - Supine Piriformis Stretch with Foot on Ground  - 1-3 x daily - 7 x weekly - 1 sets - 3 reps - 30 seconds  hold - supine single knee to chest position with hip extension isometrics  - 1-3 x daily - 7 x weekly - 2 sets - 8 reps - Prone Hip Extension  - 1 x daily - 5-7 x weekly - 2 sets - 8 reps - Sidelying Hip Abduction  - 1 x daily - 5-7 x weekly - 2 sets - 8 reps  ASSESSMENT:  CLINICAL IMPRESSION:  Session focused on reassessing pt's STG and LTG's as pt requiring re-certification to continue skilled PT. Pt notes achieving 3/5 of his goals including being HEP compliant, exceeding her FOTO score, and improving her 5xSTS time. Pt continues to note increased worst LBP (10/10) along with increased pain and limited distance traveled during the . Prolonged standing and walking continue to increase pt's irritability of sx limiting ADL's and work- related tasks. However, pt is making progress in lowering daily LBP and improving core/ lumbar strengthening. PT and pt agree that pt would continue to benefit from skilled PT interventions to address remaining lumbar strength, mobility and pain deficits to improve QoL and return to PLOF.   OBJECTIVE  IMPAIRMENTS: decreased mobility, decreased strength, hypomobility, and pain.   ACTIVITY LIMITATIONS: lifting, bending, sitting, and standing  PARTICIPATION LIMITATIONS: community activity and work duties  PERSONAL FACTORS: Age and Time since onset of injury/illness/exacerbation are also affecting patient's functional outcome.   REHAB POTENTIAL: Good  CLINICAL DECISION MAKING: Stable/uncomplicated  EVALUATION COMPLEXITY: low   GOALS: Goals reviewed with patient? Yes  SHORT TERM GOALS: Target date: 10/31/22  Pt will be independent with HEP to improve hip and core strength and decrease pain with functional activities  Baseline:10/10/22: HEP given to pt 11/21/22: HEP compliant  Goal status: MET  LONG TERM GOALS: Target date: 01/02/23  Pt will improve FOTO to target score to demonstrate clinically significant improvement in functional mobility.  Baseline: 10/10/22: 43/53 11/21/22: 63/53 Goal status: MET  2. Pt will decrease worst back pain as reported NPS by at least 2 points in order to demonstrate clinically significant reduction in back pain and improve ability to perform tasks which involve standing, walking and sitting more comfortably.   Baseline: 10/10/22: worst LBP 10/10 w/ activity 11/21/22: worst LBP 10/10NPS w/ activity  Goal status: ONGOING  3.  Pt will increase 5xSTS to <12 seconds to display clinically significant improvements in LE functional strength and improve pt's IND w/ ADL's.  Baseline: 10/10/22: 9.47 sec Goal status: MET    4.  Pt will increase 6 MWT by > 165' to display improvements in functional endurance with community ambulation w/ < 6/10 NPS LBP. Baseline: 10/10/22: deferred to next session. 10/24/22: 1428.48ft  11/21/22: 1259ft 5/10NPS Goal status: ONGOING  PLAN:  PT FREQUENCY: 2x/week  PT DURATION: 6 weeks  PLANNED INTERVENTIONS: Therapeutic exercises, Therapeutic activity, Neuromuscular re-education, Balance training, Gait  training,  Patient/Family education, Self Care, Joint mobilization, Stair training, Dry Needling, Spinal manipulation, Spinal mobilization, Cryotherapy, Moist heat, Manual therapy, and Re-evaluation.  PLAN FOR NEXT SESSION: Continue to progress core/ lumbar and hip strengthening activities, NMES?   Lovie Macadamia, SPT  Delphia Grates. Fairly IV, PT, DPT Physical Therapist- [place]  [facility]  11/21/2022, 1:23 PM

## 2022-11-28 ENCOUNTER — Ambulatory Visit: Payer: Medicaid Other

## 2022-11-29 ENCOUNTER — Ambulatory Visit: Payer: Self-pay

## 2022-11-29 NOTE — Telephone Encounter (Signed)
  Chief Complaint: medication problem Symptoms: still having vaginal outbreak Frequency:  Pertinent Negatives: NA Disposition: [] ED /[] Urgent Care (no appt availability in office) / [] Appointment(In office/virtual)/ []  [facility]/ [] Home Care/ [] Refused Recommended Disposition /[] [place] [facility]/ [x]  Follow-up with PCP Additional Notes: pt is asking if Valtrex can be sent in to help with outbreak she is having. Said she aint had one in years but is dealing with a death in family and out of town. Feels just stressed and possibly cause of outbreak. Pt would like FU call if Valtrex is sent in and needs it go to [employer], [place].   Summary: Medication request   Patient states the metroNIDAZOLE (FLAGYL) 500 MG tablet is not working and she would like to switch back to Valtrex.       Reason for Disposition  [1] Caller has NON-URGENT medicine question about med that PCP prescribed AND [2] triager unable to answer question  Answer Assessment - Initial Assessment Questions 1. NAME of MEDICINE: "What medicine(s) are you calling about?"     Flagyl 500mg  2. QUESTION: "What is your question?" (e.g., double dose of medicine, side effect)     Wanting to have Valtrex since she feel Flagyl isnt helping  3. PRESCRIBER: "Who prescribed the medicine?" Reason: if prescribed by specialist, call should be referred to that group.     Dr. Ashley Royalty 4. SYMPTOMS: "Do you have any symptoms?" If Yes, ask: "What symptoms are you having?"  "How bad are the symptoms (e.g., mild, moderate, severe)     Still having outbreak and similar to before  Protocols used: Medication Question Call-A-AH

## 2022-11-30 ENCOUNTER — Telehealth: Payer: Self-pay | Admitting: Family Medicine

## 2022-11-30 ENCOUNTER — Ambulatory Visit: Payer: Medicaid Other

## 2022-11-30 ENCOUNTER — Other Ambulatory Visit: Payer: Self-pay

## 2022-11-30 MED ORDER — VALACYCLOVIR HCL 1 G PO TABS: 1000.0000 mg | ORAL_TABLET | Freq: Every day | ORAL | 0 refills | Status: AC

## 2022-11-30 NOTE — Telephone Encounter (Signed)
Pt is calling to follow up on request for medication Valtrex. Advised pt that an appt would be needed since Dr. Ashley Royalty never wrote the script before. Pt states that she will go to UC since she is out of town.

## 2022-11-30 NOTE — Telephone Encounter (Signed)
Patient informed.  Dawn Hunter 

## 2022-12-03 ENCOUNTER — Ambulatory Visit: Payer: Medicaid Other | Attending: Family Medicine

## 2022-12-03 DIAGNOSIS — M5442 Lumbago with sciatica, left side: Secondary | ICD-10-CM | POA: Diagnosis not present

## 2022-12-03 DIAGNOSIS — G8929 Other chronic pain: Secondary | ICD-10-CM

## 2022-12-03 DIAGNOSIS — M6281 Muscle weakness (generalized): Secondary | ICD-10-CM | POA: Diagnosis not present

## 2022-12-03 NOTE — Therapy (Unsigned)
OUTPATIENT PHYSICAL THERAPY THORACOLUMBAR TREATMENT   Patient Name: Dawn Hunter MRN: [id number] DOB:[birth year], 42 y.o., female Today's Date: 12/04/2022  END OF SESSION:  PT End of Session - 12/03/22 1355     Visit Number 7    Number of Visits 13    Date for PT Re-Evaluation 01/02/23    PT Start Time 1355    PT Stop Time 1428    PT Time Calculation (min) 33 min    Activity Tolerance Patient tolerated treatment well;Patient limited by pain    Behavior During Therapy [facility] for tasks assessed/performed             Past Medical History:  Diagnosis Date   Anxiety 10/30/2013   Hypertension    Past Surgical History:  Procedure Laterality Date   CESAREAN SECTION     CHOLECYSTECTOMY  08/09/2013   TUBAL LIGATION     Patient Active Problem List   Diagnosis Date Noted   Acute vaginitis 11/16/2022   Yeast dermatitis 11/16/2022   Bilateral rotator cuff syndrome 11/16/2022   Arthropathy of left sacroiliac joint 02/01/2022   Greater trochanteric pain syndrome of left lower extremity 02/01/2022   Spondylosis of lumbosacral region without myelopathy or radiculopathy 01/26/2022   Lumbar spondylosis 10/19/2021   Lumbar degenerative disc disease 10/19/2021   Chronic pain syndrome 10/19/2021   Axillary tenderness 09/14/2021   Need for Tdap vaccination 09/01/2021   Pain in axilla 09/01/2021   Healthcare maintenance 06/01/2021   Symptomatic mammary hypertrophy 05/04/2021   Left lumbosacral radiculopathy 05/04/2021   Hypertension 05/04/2021    PCP: Jerrol Banana, MD  REFERRING PROVIDER: Jerrol Banana, MD  REFERRING DIAG:  [phone] (ICD-10-CM) - Spondylosis of lumbosacral region without myelopathy or radiculopathy    Rationale for Evaluation and Treatment: Rehabilitation  THERAPY DIAG:  Chronic left-sided low back pain with left-sided sciatica  Muscle weakness (generalized)  ONSET DATE: 2016, most recent episode since November 2023  SUBJECTIVE:                                                                                                                                                                                            SUBJECTIVE STATEMENT: Pt reports a 5/10 pain in the LBP. Pt reports being able to walk the entire time with multiple seated rest breaks at [employer] two weekends ago. Pt reports increased stiffness in the low back, since this activity. Along with, a recent death in the family increasing overall stress levels.   PERTINENT HISTORY:   Pt reports chronic low back pain w/ LLE N/T. This pain began with insidious onset around 2016 and this current episode has  continued to worsen since November, 2023. Pain on the L sided lower back and into the L buttock area specifically around the L piriformis. Pain is described as stabbing and throbbing. Radicular symptoms noted traveling down her posterior LLE along S1 dermatome. Pt's aggravating factors include prolonged walking, sitting, and standing and the pain is relieved with OTC medication, self massage of L lumbar paraspinals, and heating pad application. Pt's pain is worse in the morning compared to throughout the day.Pt has had a corticosteroid shot in her back to assist with her pain, but the this did not help much.   PAIN:  Are you having pain? Yes: NPRS scale: 5/10 Pain location: Left paraspinals> R paraspinals Pain description: Throbbing pain, stabbing,  Aggravating factors: Walking, standing, sitting (worse with extension)  Relieving factors: OTC medications, heat pad, massage Pain at worse is 10/10 during aggravating factors.     PRECAUTIONS: None  RED FLAGS: Bowel or bladder incontinence: No, Spinal tumors: No, and Cauda equina syndrome: No   WEIGHT BEARING RESTRICTIONS: No  FALLS:  Has patient fallen in last 6 months? No  OCCUPATION: Warehouse (lots of standing and walking)   PLOF: Independent  PATIENT GOALS: To be able to do ADL's pain- free  NEXT MD VISIT: October,  2024  OBJECTIVE:   DIAGNOSTIC FINDINGS:  DG Lumbar Spine Complete 05/05/2022  CLINICAL DATA:  Chronic low back pain, left side radiculopathy into calf, progressively worsening.   EXAM: LUMBAR SPINE - COMPLETE 4+ VIEW   COMPARISON:  None.   FINDINGS: Five non-rib-bearing lumbar vertebral bodies. Lower lumbar spine facet arthropathy. Osteophyte formation along the thoracic spine. There is no evidence of lumbar spine fracture. Alignment is normal. Intervertebral disc spaces are maintained.   Right upper quadrant surgical clips. Phleboliths noted overlying the pelvis.   IMPRESSION: No acute displaced fracture or traumatic listhesis of the lumbar spine.    PATIENT SURVEYS:  FOTO 43/53  SCREENING FOR RED FLAGS: Bowel or bladder incontinence: No Spinal tumors: No Cauda equina syndrome: No Compression fracture: No Abdominal aneurysm: No  COGNITION: Overall cognitive status: Within functional limits for tasks assessed     SENSATION: WFL LQNS performed- negative   MUSCLE LENGTH: WFL  POSTURE: rounded shoulders and forward head  PALPATION: TTP around L lumbar paraspinals> R lumbar paraspinals, and around L piriformis/ gluteal musculature.   LUMBAR ROM:   AROM eval  Flexion 75%*  Extension 50%*  Right lateral flexion 75%  Left lateral flexion 50%*  Right rotation 75%  Left rotation 50%*   (Blank rows = not tested) *= concordant pain  LOWER EXTREMITY MMT:    MMT Right eval Left eval  Hip flexion 4+ 4  Hip extension 4- 4-  Hip abduction 4- 4-  Hip adduction 4- 4-  Hip internal rotation 4 4  Hip external rotation 4 4  Knee flexion 4+ 4+  Knee extension 4+ 4+  Ankle dorsiflexion 4+ 4+   (Blank rows = not tested)  LUMBAR SPECIAL TESTS:  Slump test: Negative, FABER test: Negative, and Long sit test: Negative  FADDIR: Negative FUNCTIONAL TESTS:  5 times sit to stand: 9.47sec 6 minute walk test: 1428.71ft  JOINT MOBILITY: L1- L5 hypomobility noted w/  PAM's, w/ concordant pain in the throughout all lumbar segments.   TODAY'S TREATMENT:  DATE: 12/04/22  There.ex:  Heat pack on lumbar spine:  Supine LTR's x15/ each side Supine alternating RLE/LLE single knee to chest 2 x10  Forward/ R lateral flexion/ L lateral flexion clear physioball rollouts x12  TTP noted at left> right lumbar paraspinals in prone. Biofreeze application for pain modulation and to decrease inflammation.  Standing static paloff press w/ GTB x6/ each side  Seated thoracic extension w/ bolster at upper lumbar spine x12   PATIENT EDUCATION:  Education details: HEP given to pt Person educated: Patient Education method: Explanation, Demonstration, and Handouts Education comprehension: verbalized understanding and returned demonstration  HOME EXERCISE PROGRAM: Access Code: [email or web address] URL: [email or web address] Date: 10/10/2022 Prepared by: Loralyn Freshwater  Exercises - Supine Piriformis Stretch with Foot on Ground  - 1-3 x daily - 7 x weekly - 1 sets - 3 reps - 30 seconds  hold - supine single knee to chest position with hip extension isometrics  - 1-3 x daily - 7 x weekly - 2 sets - 8 reps - Prone Hip Extension  - 1 x daily - 5-7 x weekly - 2 sets - 8 reps - Sidelying Hip Abduction  - 1 x daily - 5-7 x weekly - 2 sets - 8 reps  ASSESSMENT:  CLINICAL IMPRESSION:  Session focused on lumbar and core strengthening, as well as pain modulation, due to the pt presenting with increased irritability of symptoms at today's session. Biofreeze was applied to the bilat paraspinal muscles to assist in pain modulation and reduce inflammation. Pt reported heightened stress levels, which may be contributing to her increased LBP, indicating a potential psychosomatic component to her sx's. Core strengthening exercises were modified to accommodate the  patient's increased LBP while continuing to promote core stability. Educated pt on stress management strategies in conjunction with physical therapy to enhance overall outcomes. Pt would continue to benefit from skilled PT interventions to address remaining lumbar strength, mobility and pain deficits to improve QoL and return to PLOF.   OBJECTIVE IMPAIRMENTS: decreased mobility, decreased strength, hypomobility, and pain.   ACTIVITY LIMITATIONS: lifting, bending, sitting, and standing  PARTICIPATION LIMITATIONS: community activity and work duties  PERSONAL FACTORS: Age and Time since onset of injury/illness/exacerbation are also affecting patient's functional outcome.   REHAB POTENTIAL: Good  CLINICAL DECISION MAKING: Stable/uncomplicated  EVALUATION COMPLEXITY: low   GOALS: Goals reviewed with patient? Yes  SHORT TERM GOALS: Target date: 10/31/22  Pt will be independent with HEP to improve hip and core strength and decrease pain with functional activities  Baseline:10/10/22: HEP given to pt 11/21/22: HEP compliant  Goal status: MET  LONG TERM GOALS: Target date: 01/02/23  Pt will improve FOTO to target score to demonstrate clinically significant improvement in functional mobility.  Baseline: 10/10/22: 43/53 11/21/22: 63/53 Goal status: MET  2. Pt will decrease worst back pain as reported NPS by at least 2 points in order to demonstrate clinically significant reduction in back pain and improve ability to perform tasks which involve standing, walking and sitting more comfortably.   Baseline: 10/10/22: worst LBP 10/10 w/ activity 11/21/22: worst LBP 10/10NPS w/ activity  Goal status: ONGOING  3.  Pt will increase 5xSTS to <12 seconds to display clinically significant improvements in LE functional strength and improve pt's IND w/ ADL's.  Baseline: 10/10/22: 9.47 sec Goal status: MET    4.  Pt will increase 6 MWT by > 165' to display improvements in functional endurance with community  ambulation w/ < 6/10 NPS LBP. Baseline: 10/10/22: deferred  to next session. 10/24/22: 1428.14ft  11/21/22: 1240ft 5/10NPS Goal status: ONGOING  PLAN:  PT FREQUENCY: 2x/week  PT DURATION: 6 weeks  PLANNED INTERVENTIONS: Therapeutic exercises, Therapeutic activity, Neuromuscular re-education, Balance training, Gait training, Patient/Family education, Self Care, Joint mobilization, Stair training, Dry Needling, Spinal manipulation, Spinal mobilization, Cryotherapy, Moist heat, Manual therapy, and Re-evaluation.  PLAN FOR NEXT SESSION: Continue to progress core/ lumbar and hip strengthening activities   Lovie Macadamia, SPT  Delphia Grates. Fairly IV, PT, DPT Physical Therapist- [place]  [facility]  12/04/2022, 8:12 AM

## 2022-12-06 ENCOUNTER — Ambulatory Visit: Payer: Medicaid Other

## 2022-12-10 ENCOUNTER — Ambulatory Visit: Payer: Medicaid Other

## 2022-12-13 ENCOUNTER — Ambulatory Visit: Payer: Medicaid Other

## 2022-12-13 DIAGNOSIS — G8929 Other chronic pain: Secondary | ICD-10-CM

## 2022-12-13 DIAGNOSIS — M6281 Muscle weakness (generalized): Secondary | ICD-10-CM

## 2022-12-13 DIAGNOSIS — M5442 Lumbago with sciatica, left side: Secondary | ICD-10-CM | POA: Diagnosis not present

## 2022-12-13 NOTE — Therapy (Addendum)
OUTPATIENT PHYSICAL THERAPY THORACOLUMBAR DISCHARGE SUMMARY   Patient Name: Dawn Hunter MRN: [id number] DOB:[birth year], 42 y.o., female Today's Date: 12/13/2022  END OF SESSION:  PT End of Session - 12/13/22 1122     Visit Number 8    Number of Visits 13    Date for PT Re-Evaluation 01/02/23    PT Start Time 1115    PT Stop Time 1145    PT Time Calculation (min) 30 min    Activity Tolerance Patient tolerated treatment well;Patient limited by pain    Behavior During Therapy [facility] for tasks assessed/performed             Past Medical History:  Diagnosis Date   Anxiety 10/30/2013   Hypertension    Past Surgical History:  Procedure Laterality Date   CESAREAN SECTION     CHOLECYSTECTOMY  08/09/2013   TUBAL LIGATION     Patient Active Problem List   Diagnosis Date Noted   Acute vaginitis 11/16/2022   Yeast dermatitis 11/16/2022   Bilateral rotator cuff syndrome 11/16/2022   Arthropathy of left sacroiliac joint 02/01/2022   Greater trochanteric pain syndrome of left lower extremity 02/01/2022   Spondylosis of lumbosacral region without myelopathy or radiculopathy 01/26/2022   Lumbar spondylosis 10/19/2021   Lumbar degenerative disc disease 10/19/2021   Chronic pain syndrome 10/19/2021   Axillary tenderness 09/14/2021   Need for Tdap vaccination 09/01/2021   Pain in axilla 09/01/2021   Healthcare maintenance 06/01/2021   Symptomatic mammary hypertrophy 05/04/2021   Left lumbosacral radiculopathy 05/04/2021   Hypertension 05/04/2021    PCP: Jerrol Banana, MD  REFERRING PROVIDER: Jerrol Banana, MD  REFERRING DIAG:  [phone] (ICD-10-CM) - Spondylosis of lumbosacral region without myelopathy or radiculopathy    Rationale for Evaluation and Treatment: Rehabilitation  THERAPY DIAG:  Chronic left-sided low back pain with left-sided sciatica  Muscle weakness (generalized)  ONSET DATE: 2016, most recent episode since November 2023  SUBJECTIVE:                                                                                                                                                                                            SUBJECTIVE STATEMENT: Pt reports a 6/10 NPS LBP. She reports continuous stiffness in the low back limiting work related tasks and ADL's.   PERTINENT HISTORY:   Pt reports chronic low back pain w/ LLE N/T. This pain began with insidious onset around 2016 and this current episode has continued to worsen since November, 2023. Pain on the L sided lower back and into the L buttock area specifically around the L piriformis. Pain is described as stabbing  and throbbing. Radicular symptoms noted traveling down her posterior LLE along S1 dermatome. Pt's aggravating factors include prolonged walking, sitting, and standing and the pain is relieved with OTC medication, self massage of L lumbar paraspinals, and heating pad application. Pt's pain is worse in the morning compared to throughout the day.Pt has had a corticosteroid shot in her back to assist with her pain, but the this did not help much.   PAIN:  Are you having pain? Yes: NPRS scale: 6/10 Pain location: Left paraspinals> R paraspinals Pain description: Throbbing pain, stabbing,  Aggravating factors: Walking, standing, sitting (worse with extension)  Relieving factors: OTC medications, heat pad, massage Pain at worse is 10/10 during aggravating factors.     PRECAUTIONS: None  RED FLAGS: Bowel or bladder incontinence: No, Spinal tumors: No, and Cauda equina syndrome: No   WEIGHT BEARING RESTRICTIONS: No  FALLS:  Has patient fallen in last 6 months? No  OCCUPATION: Warehouse (lots of standing and walking)   PLOF: Independent  PATIENT GOALS: To be able to do ADL's pain- free  NEXT MD VISIT: October, 2024  OBJECTIVE:   DIAGNOSTIC FINDINGS:  DG Lumbar Spine Complete 05/05/2022  CLINICAL DATA:  Chronic low back pain, left side radiculopathy into calf,  progressively worsening.   EXAM: LUMBAR SPINE - COMPLETE 4+ VIEW   COMPARISON:  None.   FINDINGS: Five non-rib-bearing lumbar vertebral bodies. Lower lumbar spine facet arthropathy. Osteophyte formation along the thoracic spine. There is no evidence of lumbar spine fracture. Alignment is normal. Intervertebral disc spaces are maintained.   Right upper quadrant surgical clips. Phleboliths noted overlying the pelvis.   IMPRESSION: No acute displaced fracture or traumatic listhesis of the lumbar spine.    PATIENT SURVEYS:  FOTO 43/53  SCREENING FOR RED FLAGS: Bowel or bladder incontinence: No Spinal tumors: No Cauda equina syndrome: No Compression fracture: No Abdominal aneurysm: No  COGNITION: Overall cognitive status: Within functional limits for tasks assessed     SENSATION: WFL LQNS performed- negative   MUSCLE LENGTH: WFL  POSTURE: rounded shoulders and forward head  PALPATION: TTP around L lumbar paraspinals> R lumbar paraspinals, and around L piriformis/ gluteal musculature.   LUMBAR ROM:   AROM eval  Flexion 75%*  Extension 50%*  Right lateral flexion 75%  Left lateral flexion 50%*  Right rotation 75%  Left rotation 50%*   (Blank rows = not tested) *= concordant pain  LOWER EXTREMITY MMT:    MMT Right eval Left eval  Hip flexion 4+ 4  Hip extension 4- 4-  Hip abduction 4- 4-  Hip adduction 4- 4-  Hip internal rotation 4 4  Hip external rotation 4 4  Knee flexion 4+ 4+  Knee extension 4+ 4+  Ankle dorsiflexion 4+ 4+   (Blank rows = not tested)  LUMBAR SPECIAL TESTS:  Slump test: Negative, FABER test: Negative, and Long sit test: Negative  FADDIR: Negative FUNCTIONAL TESTS:  5 times sit to stand: 9.47sec 6 minute walk test: 1428.72ft  JOINT MOBILITY: L1- L5 hypomobility noted w/ PAM's, w/ concordant pain in the throughout all lumbar segments.   TODAY'S TREATMENT:  DATE: 12/13/22  Beginning of session spent reassessing pt's goals and POC to complete re-evaluation/ discharge. (See below)    PATIENT EDUCATION:  Education details: HEP given to pt Person educated: Patient Education method: Explanation, Demonstration, and Handouts Education comprehension: verbalized understanding and returned demonstration  HOME EXERCISE PROGRAM: Access Code: [email or web address] URL: [email or web address] Date: 10/10/2022 Prepared by: Loralyn Freshwater  Exercises - Supine Piriformis Stretch with Foot on Ground  - 1-3 x daily - 7 x weekly - 1 sets - 3 reps - 30 seconds  hold - supine single knee to chest position with hip extension isometrics  - 1-3 x daily - 7 x weekly - 2 sets - 8 reps - Prone Hip Extension  - 1 x daily - 5-7 x weekly - 2 sets - 8 reps - Sidelying Hip Abduction  - 1 x daily - 5-7 x weekly - 2 sets - 8 reps  ASSESSMENT:  CLINICAL IMPRESSION:  Session focused on reassessing pt's STG and LTG's as pt requires a re-evaluation for continued PT services. Pt reports achieving 3/5 of her STG and LTG's, including exceeding her target FOTO score, maintaining HEP compliance, and improving her 5xSTS time. However, pt has not yet achieved her goals related to LBP reduction or the . PT and pt agree that pt has reached a plateau in progress, with LBP continuing to be a significant limiting factor in advancing further with PT. Given the persistent nature of the LBP, pt would benefit from further consultation with her PCP for continued diagnostic testing to address the underlying causes of pain. PT and pt agree that pt will discharge from PT today and will reach out after following up with her PCP regarding further testing or imaging.  OBJECTIVE IMPAIRMENTS: decreased mobility, decreased strength, hypomobility, and pain.   ACTIVITY LIMITATIONS: lifting, bending, sitting, and standing  PARTICIPATION LIMITATIONS:  community activity and work duties  PERSONAL FACTORS: Age and Time since onset of injury/illness/exacerbation are also affecting patient's functional outcome.   REHAB POTENTIAL: Good  CLINICAL DECISION MAKING: Stable/uncomplicated  EVALUATION COMPLEXITY: low   GOALS: Goals reviewed with patient? Yes  SHORT TERM GOALS: Target date: 10/31/22  Pt will be independent with HEP to improve hip and core strength and decrease pain with functional activities  Baseline:10/10/22: HEP given to pt 11/21/22: HEP compliant  Goal status: MET  LONG TERM GOALS: Target date: 01/02/23  Pt will improve FOTO to target score to demonstrate clinically significant improvement in functional mobility.  Baseline: 10/10/22: 43/53 11/21/22: 63/53 12/13/22: 63/53 Goal status: MET  2. Pt will decrease worst back pain as reported NPS by at least 2 points in order to demonstrate clinically significant reduction in back pain and improve ability to perform tasks which involve standing, walking and sitting more comfortably.  Baseline: 10/10/22: worst LBP 10/10 w/ activity 11/21/22: worst LBP 10/10NPS w/ activity 12/13/22: 10/10NPS w/ activity Goal status: NOT MET  3.  Pt will increase 5xSTS to <12 seconds to display clinically significant improvements in LE functional strength and improve pt's IND w/ ADL's.  Baseline: 10/10/22: 9.47 sec Goal status: MET    4.  Pt will increase 6 MWT by > 165' to display improvements in functional endurance with community ambulation w/ < 6/10 NPS LBP. Baseline: 10/10/22: deferred to next session. 10/24/22: 1428.41ft  11/21/22: 1273ft 5/10NPS 12/13/22: 1225ft 7/10NPS  Goal status: NOT MET  PLAN:  PT FREQUENCY: 2x/week  PT DURATION: 6 weeks  PLANNED INTERVENTIONS: Therapeutic exercises, Therapeutic activity, Neuromuscular re-education, Balance training, Gait  training, Patient/Family education, Self Care, Joint mobilization, Stair training, Dry Needling, Spinal manipulation, Spinal  mobilization, Cryotherapy, Moist heat, Manual therapy, and Re-evaluation.  PLAN FOR NEXT SESSION: Pt discharged.   Lovie Macadamia, SPT  Delphia Grates. Fairly IV, PT, DPT Physical Therapist- [place]  [facility]  12/13/2022, 3:31 PM

## 2022-12-20 ENCOUNTER — Ambulatory Visit: Payer: Medicaid Other

## 2022-12-24 IMAGING — CR DG LUMBAR SPINE COMPLETE 4+V
5 series · 5 of 5 positions shown · non-contrast
Comparison: None.

CLINICAL DATA: Chronic low back pain, left side radiculopathy into
calf, progressively worsening.

EXAM:
LUMBAR SPINE - COMPLETE 4+ VIEW

[l-spine ap]
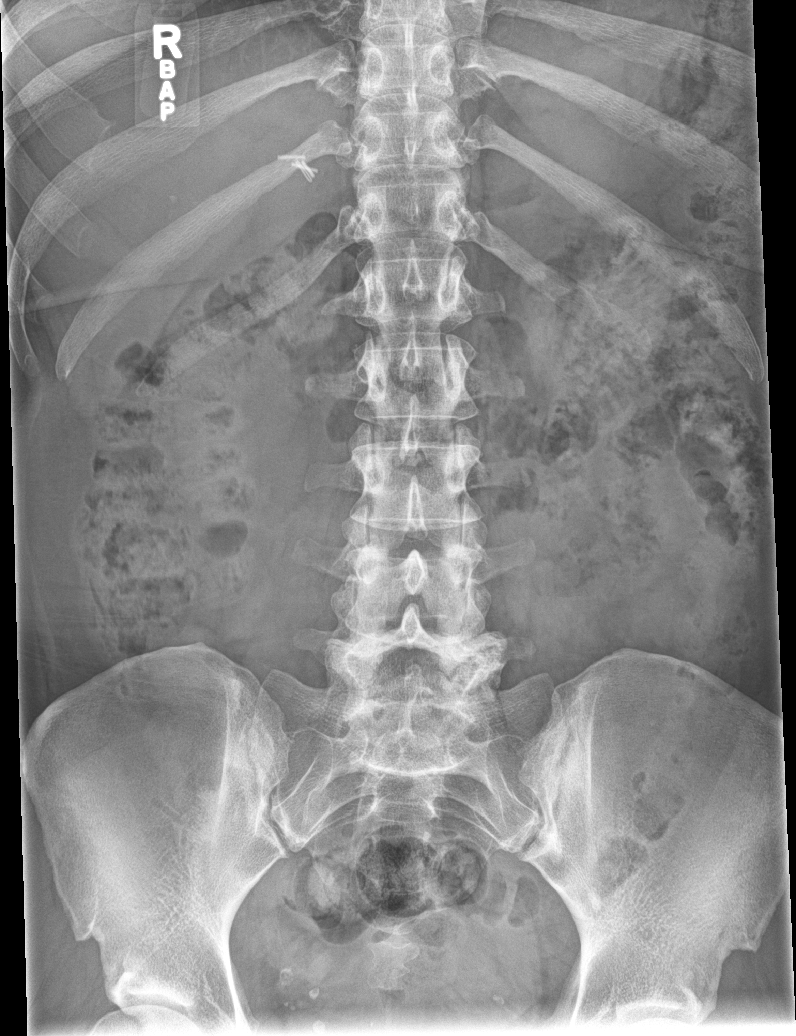

[l-spine obl (1 of 2)]
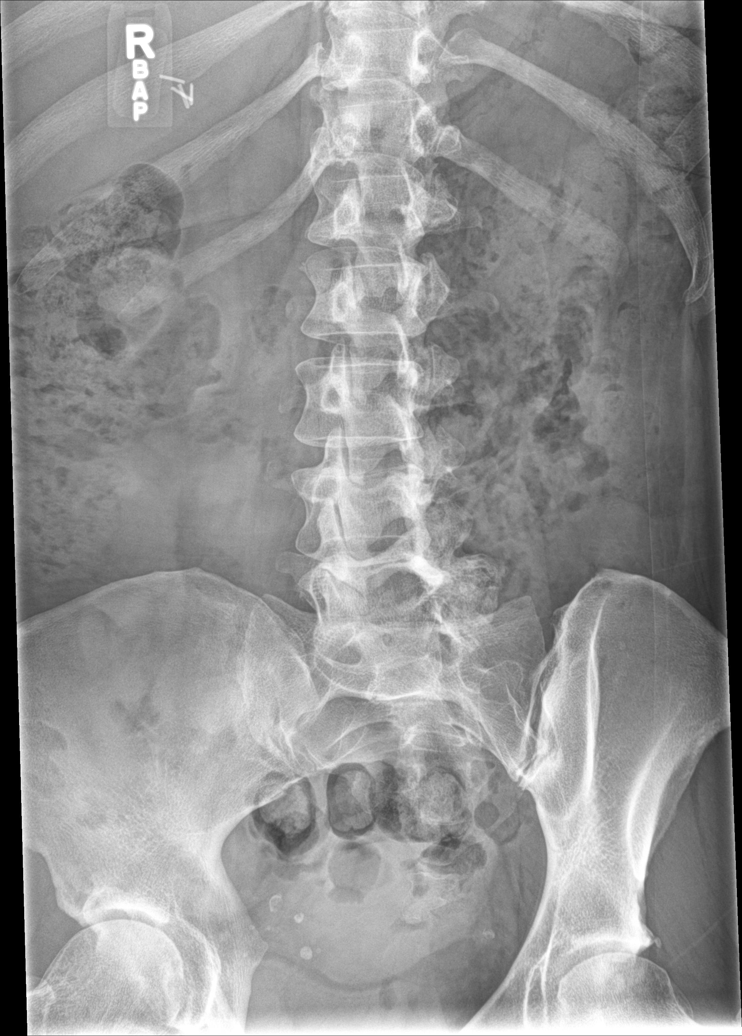

[l-spine obl (2 of 2)]
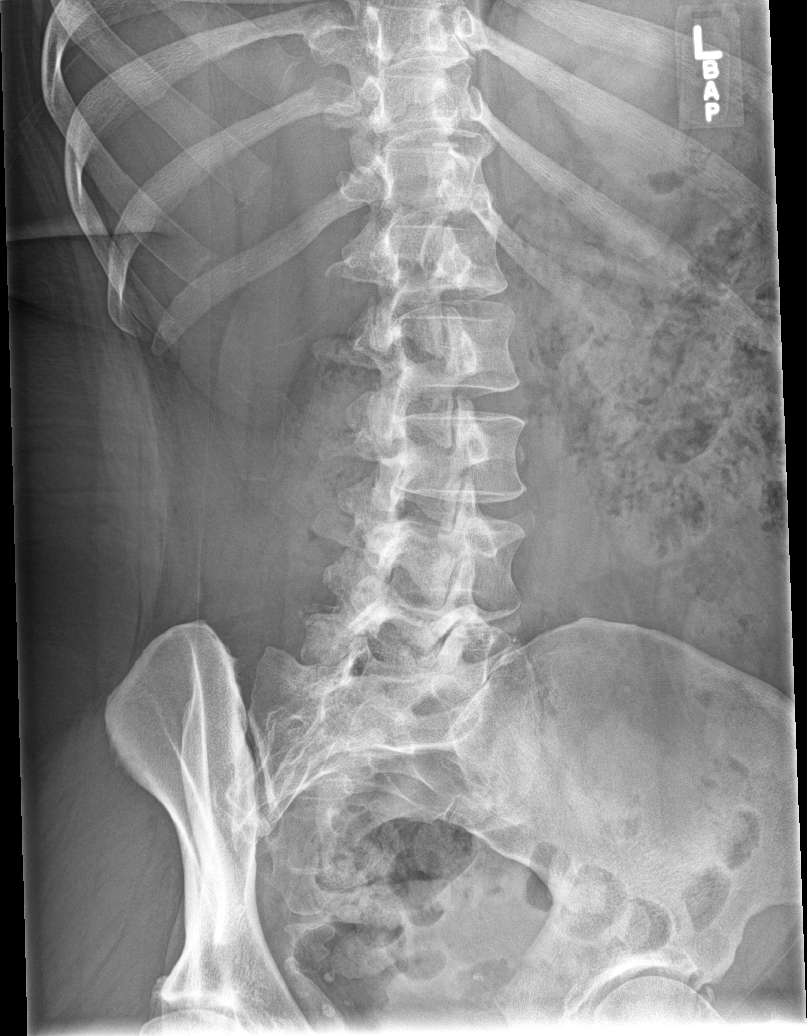

[l-spine lat]
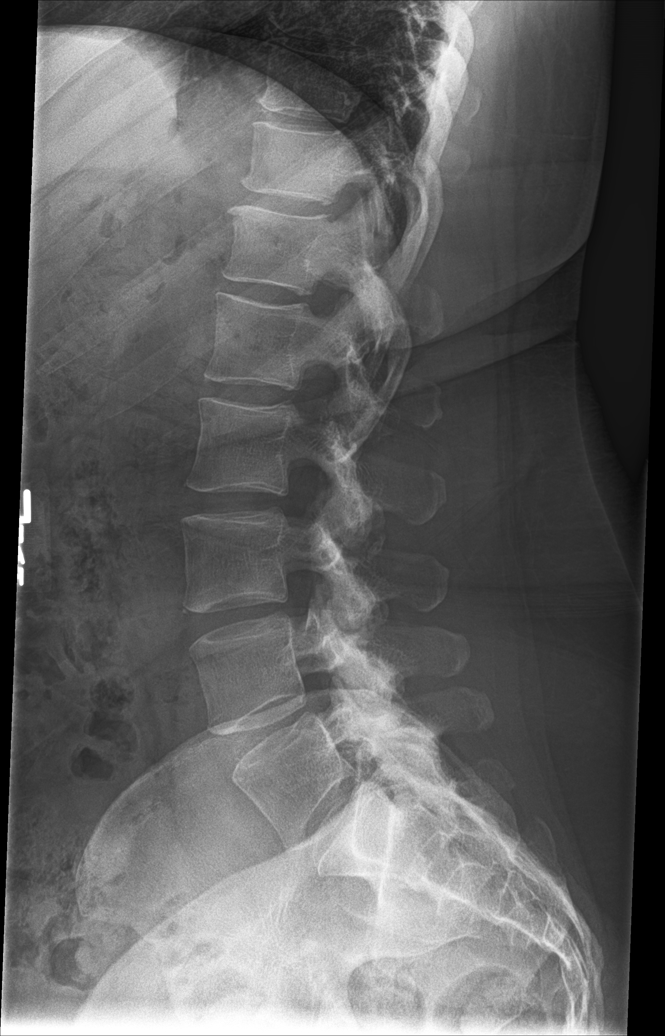

[l-spine spot]
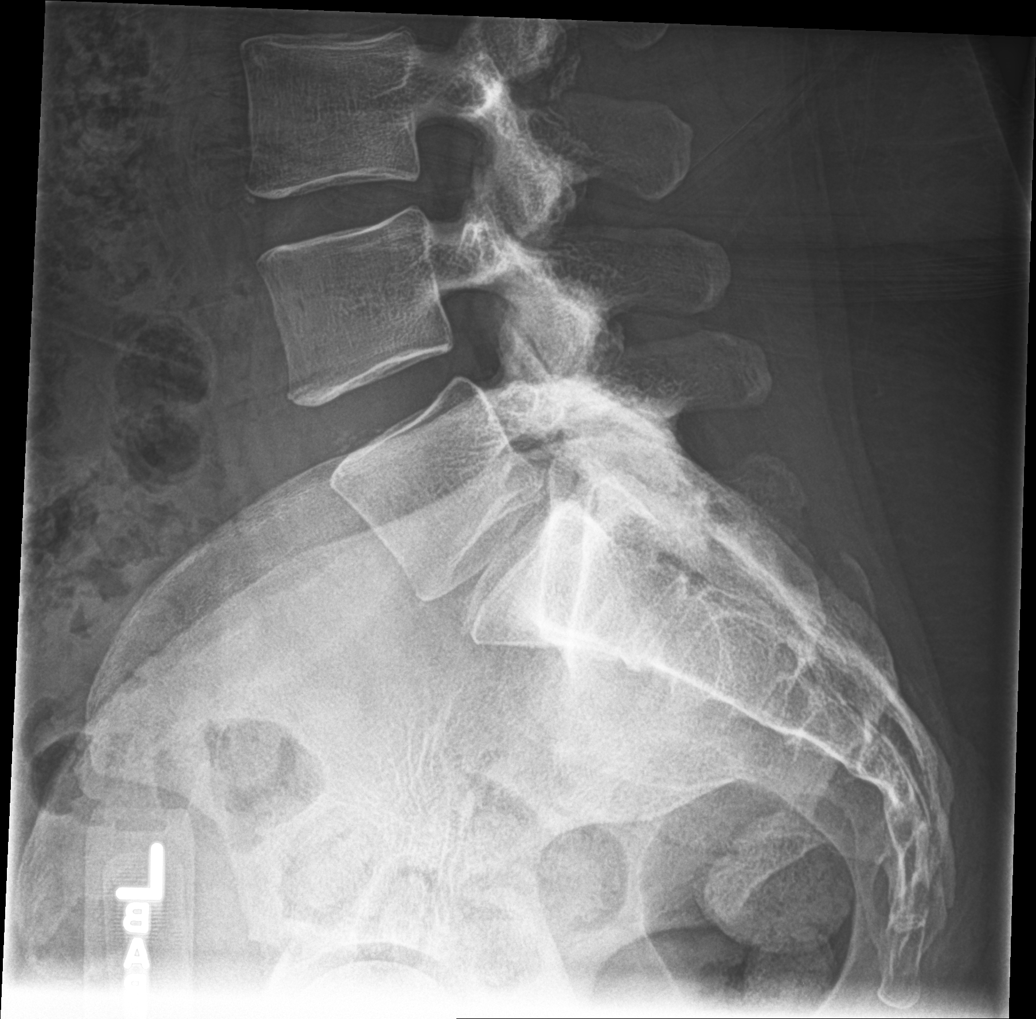

[5 of 5 positions shown; findings below may reference images not displayed]

FINDINGS: Five non-rib-bearing lumbar vertebral bodies. Lower lumbar spine
facet arthropathy. Osteophyte formation along the thoracic spine.
There is no evidence of lumbar spine fracture. Alignment is normal.
Intervertebral disc spaces are maintained.

Right upper quadrant surgical clips. Phleboliths noted overlying the
pelvis.
IMPRESSION: No acute displaced fracture or traumatic listhesis of the lumbar
spine.

## 2022-12-26 ENCOUNTER — Encounter: Payer: Medicaid Other | Admitting: Physical Therapy

## 2022-12-26 DIAGNOSIS — N898 Other specified noninflammatory disorders of vagina: Secondary | ICD-10-CM | POA: Diagnosis not present

## 2022-12-26 DIAGNOSIS — L989 Disorder of the skin and subcutaneous tissue, unspecified: Secondary | ICD-10-CM | POA: Diagnosis not present

## 2022-12-26 DIAGNOSIS — Z7251 High risk heterosexual behavior: Secondary | ICD-10-CM | POA: Diagnosis not present

## 2022-12-27 ENCOUNTER — Other Ambulatory Visit: Payer: Self-pay | Admitting: Family Medicine

## 2022-12-31 NOTE — Telephone Encounter (Signed)
D/C 11/15/22. Requested Prescriptions  Refused Prescriptions Disp Refills   DULoxetine (CYMBALTA) 30 MG capsule [Pharmacy Med Name: DULOXETINE DR 30MG  CAPSULES] 90 capsule 1    Sig: TAKE 1 CAPSULE BY MOUTH AT BEDTIME FOR 1 WEEK, THEN 2 CAPSULES NIGHTLY     Psychiatry: Antidepressants - SNRI - duloxetine Passed - 12/27/2022  3:32 AM      Passed - Cr in normal range and within 360 days    Creatinine, Ser  Date Value Ref Range Status  11/15/2022 0.78 0.57 - 1.00 mg/dL Final         Passed - eGFR is 30 or above and within 360 days    GFR, Estimated  Date Value Ref Range Status  09/25/2022 >60 >60 mL/min Final    Comment:    (NOTE) Calculated using the CKD-EPI Creatinine Equation (2021)    eGFR  Date Value Ref Range Status  11/15/2022 97 >59 mL/min/1.73 Final         Passed - Completed PHQ-2 or PHQ-9 in the last 360 days      Passed - Last BP in normal range    BP Readings from Last 1 Encounters:  11/15/22 122/70         Passed - Valid encounter within last 6 months    Recent Outpatient Visits           1 month ago Healthcare maintenance   [place] Hunter, Dawn Bob, MD   3 months ago Breast hypertrophy   [place] [facility] Dawn Hunter, Dawn Bob, MD   8 months ago 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   [facility] at [facility], Dawn Alar, PA   10 months ago Spondylosis of lumbosacral region without myelopathy or radiculopathy   [facility] at [facility] Dawn Hunter, Dawn Bob, MD   11 months ago Spondylosis of lumbosacral region without myelopathy or radiculopathy   [facility] at [facility], Dawn Bob, MD       Future Appointments             In 10 months Dawn Hunter, Dawn Bob, MD [facility] at [facility], [facility]

## 2023-01-07 ENCOUNTER — Ambulatory Visit: Payer: Medicaid Other | Admitting: Family Medicine

## 2023-01-07 DIAGNOSIS — Z202 Contact with and (suspected) exposure to infections with a predominantly sexual mode of transmission: Secondary | ICD-10-CM

## 2023-01-07 DIAGNOSIS — Z113 Encounter for screening for infections with a predominantly sexual mode of transmission: Secondary | ICD-10-CM | POA: Diagnosis not present

## 2023-01-07 LAB — HM HIV SCREENING LAB: HM HIV Screening: NEGATIVE

## 2023-01-07 LAB — WET PREP FOR TRICH, YEAST, CLUE
Trichomonas Exam: NEGATIVE
Yeast Exam: NEGATIVE

## 2023-01-07 MED ORDER — PENICILLIN G BENZATHINE 1200000 UNIT/2ML IM SUSY
2.4000 10*6.[IU] | PREFILLED_SYRINGE | Freq: Once | INTRAMUSCULAR | Status: AC
  Administered 2023-01-07: 2.4 10*6.[IU] via INTRAMUSCULAR

## 2023-01-07 NOTE — Progress Notes (Signed)
Pt is here for STD visit.  Wet prep results reviewed with pt, no treatment required per standing order.  Condoms given.  Bicillin given as ordered. Pt ambulated and monitored for 15 minutes post injection, no reaction noted. Gaspar Garbe, RN

## 2023-01-07 NOTE — Progress Notes (Signed)
[facility] Department  STI clinic/screening visit [address] [place] [address] [phone]  Subjective:  Dawn Hunter is a 42 y.o. female being seen today for an STI screening visit. She presents today because her partner told her he was treated for syphilis. She went to an Urgent care and was told her result for syphilis was "weird", when she asked for treatment they gave her BID doxycycline for 2 weeks. The patient reports they do not have symptoms.  Patient reports that they do not desire a pregnancy in the next year.   They reported they are not interested in discussing contraception today because she has had a BTL.    Patient's last menstrual period was 12/29/2022.  Patient has the following medical conditions:   Patient Active Problem List   Diagnosis Date Noted   Acute vaginitis 11/16/2022   Yeast dermatitis 11/16/2022   Bilateral rotator cuff syndrome 11/16/2022   Arthropathy of left sacroiliac joint 02/01/2022   Greater trochanteric pain syndrome of left lower extremity 02/01/2022   Spondylosis of lumbosacral region without myelopathy or radiculopathy 01/26/2022   Lumbar spondylosis 10/19/2021   Lumbar degenerative disc disease 10/19/2021   Chronic pain syndrome 10/19/2021   Axillary tenderness 09/14/2021   Need for Tdap vaccination 09/01/2021   Pain in axilla 09/01/2021   Healthcare maintenance 06/01/2021   Symptomatic mammary hypertrophy 05/04/2021   Left lumbosacral radiculopathy 05/04/2021   Hypertension 05/04/2021    Chief Complaint  Patient presents with   SEXUALLY TRANSMITTED DISEASE    No symptoms possible contact for syphillis      Does the patient using douching products? Yes  Last HIV test per patient/review of record was No results found for: "HMHIVSCREEN"  Lab Results  Component Value Date   HIV Non Reactive 06/01/2021     Last HEPC test per patient/review of record was No results found for: "HMHEPCSCREEN" No  components found for: "HEPC"   Last HEPB test per patient/review of record was No components found for: "HMHEPBSCREEN" No components found for: "HEPC"   Patient reports last pap was  Lab Results  Component Value Date   DIAGPAP  06/01/2021    - Negative for intraepithelial lesion or malignancy (NILM)   No results found for: "SPECADGYN"  Screening for MPX risk: Does the patient have an unexplained rash? No Is the patient MSM? No Does the patient endorse multiple sex partners or anonymous sex partners? No Did the patient have close or sexual contact with a person diagnosed with MPX? No Has the patient traveled outside the [place] where MPX is endemic? No Is there a high clinical suspicion for MPX-- evidenced by one of the following No  -Unlikely to be chickenpox  -Lymphadenopathy  -Rash that present in same phase of evolution on any given body part See flowsheet for further details and programmatic requirements.   Immunization history:  Immunization History  Administered Date(s) Administered   Tdap 09/01/2021     The following portions of the patient's history were reviewed and updated as appropriate: allergies, current medications, past medical history, past social history, past surgical history and problem list.  Objective:  There were no vitals filed for this visit.  Physical Exam Vitals and nursing note reviewed. Exam conducted with a chaperone present Dawn Hunter).  Constitutional:      Appearance: Normal appearance.  HENT:     Head: Normocephalic and atraumatic.     Mouth/Throat:     Mouth: Mucous membranes are moist.  Pharynx: Oropharynx is clear. No oropharyngeal exudate or posterior oropharyngeal erythema.  Pulmonary:     Effort: Pulmonary effort is normal.  Abdominal:     General: Abdomen is flat.     Palpations: There is no mass.     Tenderness: There is no abdominal tenderness. There is no rebound.  Genitourinary:    General: Normal vulva.     Exam position:  Lithotomy position.     Pubic Area: No rash or pubic lice.      Labia:        Right: No rash or lesion.        Left: No rash or lesion.      Vagina: No vaginal discharge, erythema or bleeding.     Rectum: Normal.     Comments: pH = 3 Vaginal swabs collected Lymphadenopathy:     Head:     Right side of head: No preauricular or posterior auricular adenopathy.     Left side of head: No preauricular or posterior auricular adenopathy.     Cervical: No cervical adenopathy.     Upper Body:     Right upper body: No supraclavicular, axillary or epitrochlear adenopathy.     Left upper body: No supraclavicular, axillary or epitrochlear adenopathy.     Lower Body: No right inguinal adenopathy. No left inguinal adenopathy.  Skin:    General: Skin is warm and dry.     Findings: No rash.  Neurological:     Mental Status: She is alert and oriented to person, place, and time.     Assessment and Plan:  Dawn Hunter is a 42 y.o. female presenting to the [facility] Department for STI screening  1. Screening for venereal disease Educated pt on harms of douching and how to cleanse the perineum  - Syphilis Serology, [place] [facility] - HIV [place] [facility] - WET PREP FOR TRICH, YEAST, CLUE - Gonococcus culture - Chlamydia/Gonorrhea  Lab  2. Exposure to syphilis Discussed with supervising physician, Dr. Larita Fife. Decided to err on the side of treatment given possible exposure.   - penicillin g benzathine (BICILLIN LA) 1200000 UNIT/2ML injection 2.4 Million Units     Patient accepted all screenings including oral, vaginal CT/GC and bloodwork for HIV/RPR, and wet prep. Patient meets criteria for HepB screening? No. Ordered? no Patient meets criteria for HepC screening? No. Ordered? no  Treat wet prep per standing order Discussed time line for State Lab results and that patient will be called with positive results and encouraged patient to call if she had not heard in 2 weeks.   Counseled to return or seek care for continued or worsening symptoms Recommended repeat testing in 3 months with positive results. Recommended condom use with all sex  Patient is currently using Sterilization by Laparoscopy to prevent pregnancy.    No follow-ups on file.  Future Appointments  Date Time Provider Department Center  11/19/2023 10:00 AM Ashley Royalty, Ocie Bob, MD [facility]    Alicia Amel, NP

## 2023-01-12 LAB — GONOCOCCUS CULTURE

## 2023-01-14 ENCOUNTER — Other Ambulatory Visit: Payer: Self-pay | Admitting: Family Medicine

## 2023-01-14 DIAGNOSIS — E559 Vitamin D deficiency, unspecified: Secondary | ICD-10-CM

## 2023-01-15 ENCOUNTER — Ambulatory Visit: Payer: Medicaid Other | Attending: Family Medicine | Admitting: Physical Therapy

## 2023-01-15 DIAGNOSIS — M25512 Pain in left shoulder: Secondary | ICD-10-CM | POA: Insufficient documentation

## 2023-01-15 DIAGNOSIS — M25511 Pain in right shoulder: Secondary | ICD-10-CM | POA: Insufficient documentation

## 2023-01-15 NOTE — Telephone Encounter (Signed)
Requested medication (s) are due for refill today: yes   Requested medication (s) are on the active medication list: yes   Last refill:  11/20/22 #8 0 refills   Future visit scheduled: yes in 10 months   Notes to clinic:  protocol for provider to review. Do you want to continue refills on Rx?     Requested Prescriptions  Pending Prescriptions Disp Refills   Vitamin D, Ergocalciferol, (DRISDOL) 1.25 MG (50000 UNIT) CAPS capsule [Pharmacy Med Name: VITAMIN D2 50,000IU (ERGO) CAP RX] 8 capsule 0    Sig: TAKE ONE CAPSULE BY MOUTH EVERY 7 DAYS     Endocrinology:  Vitamins - Vitamin D Supplementation 2 Failed - 01/15/2023  9:28 AM      Failed - Manual Review: Route requests for 50,000 IU strength to the provider      Failed - Vitamin D in normal range and within 360 days    Vit D, 25-Hydroxy  Date Value Ref Range Status  11/15/2022 16.6 (L) 30.0 - 100.0 ng/mL Final    Comment:    Vitamin D deficiency has been defined by the Institute of Medicine and an Endocrine Society practice guideline as a level of serum 25-OH vitamin D less than 20 ng/mL (1,2). The Endocrine Society went on to further define vitamin D insufficiency as a level between 21 and 29 ng/mL (2). 1. IOM (Institute of Medicine). 2010. Dietary reference    intakes for calcium and D. [place]: The    [employer]. 2. Holick MF, [place], Bischoff-Ferrari HA, et al.    Evaluation, treatment, and prevention of vitamin D    deficiency: an Endocrine Society clinical practice    guideline. JCEM. 2011 Jul; 96(7):1911-30.          Passed - Ca in normal range and within 360 days    Calcium  Date Value Ref Range Status  11/15/2022 9.3 8.7 - 10.2 mg/dL Final         Passed - Valid encounter within last 12 months    Recent Outpatient Visits           2 months ago Healthcare maintenance   [facility], Ocie Bob, MD   3 months ago Breast  hypertrophy   [facility], Ocie Bob, MD   8 months ago 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   [facility] at [facility], Melton Alar, [place]   10 months ago Spondylosis of lumbosacral region without myelopathy or radiculopathy   [facility] at [facility], Ocie Bob, MD   11 months ago Spondylosis of lumbosacral region without myelopathy or radiculopathy   [facility] at [facility], Ocie Bob, MD       Future Appointments             In 10 months Ashley Royalty, Ocie Bob, MD [facility] at [facility], [facility]

## 2023-01-15 NOTE — Therapy (Deleted)
OUTPATIENT PHYSICAL THERAPY SHOULDER/ELBOW EVALUATION  Patient Name: Dawn Hunter MRN: [id number] DOB:[birth year], 42 y.o., female Today's Date: 01/15/2023  END OF SESSION:   Past Medical History:  Diagnosis Date   Anxiety 10/30/2013   Hypertension    Past Surgical History:  Procedure Laterality Date   CESAREAN SECTION     CHOLECYSTECTOMY  08/09/2013   TUBAL LIGATION     Patient Active Problem List   Diagnosis Date Noted   Acute vaginitis 11/16/2022   Yeast dermatitis 11/16/2022   Bilateral rotator cuff syndrome 11/16/2022   Arthropathy of left sacroiliac joint 02/01/2022   Greater trochanteric pain syndrome of left lower extremity 02/01/2022   Spondylosis of lumbosacral region without myelopathy or radiculopathy 01/26/2022   Lumbar spondylosis 10/19/2021   Lumbar degenerative disc disease 10/19/2021   Chronic pain syndrome 10/19/2021   Axillary tenderness 09/14/2021   Need for Tdap vaccination 09/01/2021   Pain in axilla 09/01/2021   Healthcare maintenance 06/01/2021   Symptomatic mammary hypertrophy 05/04/2021   Left lumbosacral radiculopathy 05/04/2021   Hypertension 05/04/2021    PCP: Jerrol Banana, MD  REFERRING PROVIDER: Jerrol Banana, MD  REFERRING DIAG:  M75.101,M75.102 (ICD-10-CM) - Bilateral rotator cuff syndrome    RATIONALE FOR EVALUATION AND TREATMENT: Rehabilitation  THERAPY DIAG: No diagnosis found.  ONSET DATE: ***  FOLLOW-UP APPT SCHEDULED WITH REFERRING PROVIDER: {yes/no:20286}    SUBJECTIVE:                                                                                                                                                                                         SUBJECTIVE STATEMENT:  Pt is a 42 year old female referred for bilateral shoulder pain; bilateral rotator cuff syndrome in the setting of mammary hypertrophy and associated kyphotic postural changes per referring physician.   PERTINENT HISTORY:  Pt is a  42 year old female referred for bilateral shoulder pain; bilateral rotator cuff syndrome in the setting of mammary hypertrophy and associated kyphotic postural changes per referring physician.   Patient reports ***  PAIN:  Pain Intensity: Present: /10, Best: /10, Worst: /10 Pain location: *** Pain Quality: {PAIN DESCRIPTION:21022940}  Radiating: {yes/no:20286}  Numbness/Tingling: {yes/no:20286} Focal Weakness: {yes/no:20286} Aggravating factors: *** Relieving factors: *** 24-hour pain behavior: *** History of prior shoulder or neck/shoulder injury, pain, surgery, or therapy: {yes/no:20286} Falls: Has patient fallen in last 6 months? {yes/no:20286}, Number of falls: *** Dominant hand: {RIGHT/LEFT:20294} Imaging: {yes/no:20286}  Red flags (personal history of cancer, chills/fever, night sweats, nausea, vomiting, unrelenting pain, unexplained weight gain/loss): Negative  PRECAUTIONS: {Therapy precautions:24002}  WEIGHT BEARING RESTRICTIONS: {Yes ***/No:24003}  FALLS: Has patient fallen in last 6 months? {fallsyesno:27318}  Living Environment Lives  with: {OPRC lives with:25569::"lives with their family"} Lives in: {Lives in:25570} Stairs: {opstairs:27293} Has following equipment at home: {Assistive devices:23999}  Prior level of function: {PLOF:24004}  Occupational demands:   Hobbies:   Patient Goals: ***    OBJECTIVE:   Patient Surveys  FOTO: QuickDASH:  Cognition Patient is oriented to person, place, and time.  Recent memory is intact.  Remote memory is intact.  Attention span and concentration are intact.  Expressive speech is intact.  Patient's fund of knowledge is within normal limits for educational level.    Gross Musculoskeletal Assessment Tremor: None Bulk: Normal Tone: Normal  Gait   Posture   Cervical Screen AROM: WFL and painless with overpressure in all planes Spurlings A (ipsilateral lateral flexion/axial compression): R:  {NEGATIVE/POSITIVE ZOX:09604} L: {NEGATIVE/POSITIVE VWU:98119} Spurlings B (ipsilateral lateral flexion/contralateral rotation/axial compression): R: {NEGATIVE/POSITIVE JYN:82956} L: {NEGATIVE/POSITIVE OZH:08657} Repeated movement: No centralization or peripheralization with protraction or retraction Hoffman Sign (cervical cord compression): R: {NEGATIVE/POSITIVE QIO:96295} L: {NEGATIVE/POSITIVE MWU:13244} ULTT Median: R: {NEGATIVE/POSITIVE WNU:27253} L: {NEGATIVE/POSITIVE GUY:40347} ULTT Ulnar: R: {NEGATIVE/POSITIVE QQV:95638} L: {NEGATIVE/POSITIVE VFI:43329} ULTT Radial: R: {NEGATIVE/POSITIVE JJO:84166} L: {NEGATIVE/POSITIVE AYT:01601}  AROM AROM (Normal range in degrees) AROM  Cervical  Flexion (50)   Extension (80)   Right lateral flexion (45)   Left lateral flexion (45)   Right rotation (85)   Left rotation (85)    Right Left  Shoulder    Flexion    Extension    Abduction    External Rotation    Internal Rotation    Hands Behind Head    Hands Behind Back        Elbow    Flexion    Extension    Pronation    Supination    (* = pain; Blank rows = not tested)  UE MMT: MMT (out of 5) Right Left   Cervical (isometric)  Flexion WNL  Extension WNL  Lateral Flexion WNL WNL  Rotation WNL WNL      Shoulder   Flexion    Extension    Abduction    External rotation    Internal rotation    Horizontal abduction    Horizontal adduction    Lower Trapezius    Rhomboids        Elbow  Flexion    Extension    Pronation    Supination        Wrist  Flexion    Extension    Radial deviation    Ulnar deviation        MCP  Flexion    Extension    Abduction    Adduction    (* = pain; Blank rows = not tested)  Sensation Grossly intact to light touch bilateral UE as determined by testing dermatomes C2-T2. Proprioception and hot/cold testing deferred on this date.  Reflexes R/L Biceps (L3/4): 2+/2+  Triceps (S1/2): 2+/2+  Brachioradialis:  2+/2  Palpation Location LEFT  RIGHT           Subocciptials    Cervical paraspinals    Upper Trapezius    Levator Scapulae    Rhomboid Major/Minor    Sternoclavicular joint    Acromioclavicular joint    Coracoid process    Long head of biceps    Supraspinatus    Infraspinatus    Subscapularis    Teres Minor    Teres Major    Pectoralis Major    Pectoralis Minor    Anterior Deltoid    Lateral Deltoid  Posterior Deltoid    Latissimus Dorsi    Sternocleidomastoid    (Blank rows = not tested) Graded on 0-4 scale (0 = no pain, 1 = pain, 2 = pain with wincing/grimacing/flinching, 3 = pain with withdrawal, 4 = unwilling to allow palpation), (Blank rows = not tested)   Passive Accessory Intervertebral Motion Pt denies reproduction of shoulder pain with CPA C2-T7 and UPA bilaterally C2-T7. Generally, hypomobile throughout  Accessory Motions/Glides Glenohumeral: Posterior: R: {normal/abnormal/not examined:14677} L: {normal/abnormal/not examined:14677} Inferior: R: {normal/abnormal/not examined:14677} L: {normal/abnormal/not examined:14677} Anterior: R: {normal/abnormal/not examined:14677} L: {normal/abnormal/not examined:14677}  Acromioclavicular:  Posterior: R: {normal/abnormal/not examined:14677} L: {normal/abnormal/not examined:14677} Anterior: R: {normal/abnormal/not examined:14677} L: {normal/abnormal/not examined:14677}  Sternoclavicular: Posterior: R: {normal/abnormal/not examined:14677} L: {normal/abnormal/not examined:14677} Anterior: R: {normal/abnormal/not examined:14677} L: {normal/abnormal/not examined:14677} Superior: R: {normal/abnormal/not examined:14677} L: {normal/abnormal/not examined:14677} Inferior: R: {normal/abnormal/not examined:14677} L: {normal/abnormal/not examined:14677}  Scapulothoracic: Distraction: R: {normal/abnormal/not examined:14677} L: {normal/abnormal/not examined:14677} Medial: R: {normal/abnormal/not examined:14677} L:  {normal/abnormal/not examined:14677} Lateral: R: {normal/abnormal/not examined:14677} L: {normal/abnormal/not examined:14677} Inferior: R: {normal/abnormal/not examined:14677} L: {normal/abnormal/not examined:14677} Superior: R: {normal/abnormal/not examined:14677} L: {normal/abnormal/not examined:14677} Upward Rotation: R: {normal/abnormal/not examined:14677} L: {normal/abnormal/not examined:14677} Downward Rotation: R: {normal/abnormal/not examined:14677} L: {normal/abnormal/not examined:14677}  Muscle Length Testing Pectoralis Major: R: {normal/abnormal/not examined:14677} L: {normal/abnormal/not examined:14677} Pectoralis Minor: R: {normal/abnormal/not examined:14677} L: {normal/abnormal/not examined:14677} Biceps: R: {normal/abnormal/not examined:14677} L: {normal/abnormal/not examined:14677}  SPECIAL TESTS Rotator Cuff  Drop Arm Test: {NEGATIVE/POSITIVE JJO:84166} Painful Arc (Pain from 60 to 120 degrees scaption): {NEGATIVE/POSITIVE AYT:01601} Infraspinatus Muscle Test: {NEGATIVE/POSITIVE UXN:23557} If all 3 tests positive, the probability of a full-thickness rotator cuff tear is 91%  Subacromial Impingement Hawkins-Kennedy: {NEGATIVE/POSITIVE DUK:02542} Neer (Block scapula, PROM flexion): {NEGATIVE/POSITIVE HCW:23762} Painful Arc (Pain from 60 to 120 degrees scaption): {NEGATIVE/POSITIVE GBT:51761} Empty Can: {NEGATIVE/POSITIVE YWV:37106} External Rotation Resistance: {NEGATIVE/POSITIVE YIR:48546} Horizontal Adduction: {NEGATIVE/POSITIVE EVO:35009} Scapular Assist: {NEGATIVE/POSITIVE FGH:82993} Positive Hawkins-Kennedy, Painful arc sign, Infraspinatus muscle test then +LR: 10.56 of some type of impingement present, 2/3 tests: +LR 5.06, -LR 0.17, Positive 3/5 Hawkins-Kennedy, neer, painful arc, empty can, and external rotation resistance then SN: .75 (.54-.96) SP: .74 (.61-.88) +LR: 2.93 (1.60-5.36) -LR: .34 (.14-.80)  Labral Tear Biceps Load II (120 elevation, full ER, 90 elbow  flexion, full supination, resisted elbow flexion): {NEGATIVE/POSITIVE ZJI:96789} Crank (160 scaption, axial load with IR/ER): {NEGATIVE/POSITIVE FYB:01751} O'Briens/Active Compression Test (90 shoulder flexion, 10 adduction, full IR): {NEGATIVE/POSITIVE WCH:85277}  Bicep Tendon Pathology Speed (shoulder flexion to 90, external rotation, full elbow extension, and forearm supination with resistance: {NEGATIVE/POSITIVE OEU:23536} Yergason's (resisted shoulder ER and supination/biceps tendon pathology): {NEGATIVE/POSITIVE RWE:31540}  Shoulder Instability Sulcus Sign: {NEGATIVE/POSITIVE GQQ:76195} Anterior Apprehension: {NEGATIVE/POSITIVE KDT:26712}  Beighton scale  LEFT  RIGHT           1. Passive dorsiflexion and hyperextension of the fifth MCP joint beyond 90  0 0   2. Passive apposition of the thumb to the flexor aspect of the forearm  0  0   3. Passive hyperextension of the elbow beyond 10  0  0   4. Passive hyperextension of the knee beyond 10  0  0   5. Active forward flexion of the trunk with the knees fully extended so that the palms of the hands rest flat on the floor   0   TOTAL         0/ 9      TODAY'S TREATMENT  ***   PATIENT EDUCATION:  Education details: *** Person educated: {Person educated:25204} Education method: {Education Method:25205} Education comprehension: {Education Comprehension:25206}   HOME EXERCISE PROGRAM:    ASSESSMENT:  CLINICAL IMPRESSION: Patient is a *** y.o. *** who was seen today for physical therapy evaluation and treatment for ***.   OBJECTIVE IMPAIRMENTS: {opptimpairments:25111}.   ACTIVITY LIMITATIONS: {activitylimitations:27494}  PARTICIPATION LIMITATIONS: {participationrestrictions:25113}  PERSONAL FACTORS: {Personal factors:25162} are also affecting patient's functional outcome.   REHAB POTENTIAL: {rehabpotential:25112}  CLINICAL DECISION MAKING: {clinical decision making:25114}  EVALUATION COMPLEXITY: {Evaluation  complexity:25115}   GOALS: Goals reviewed with patient? Yes  SHORT TERM GOALS: Target date: {follow up:25551}  Pt will be independent with HEP to improve strength and decrease shoulder pain to improve pain-free function at home and work. Baseline: *** Goal status: INITIAL   LONG TERM GOALS: Target date: {follow up:25551}  Pt will increase FOTO to at least *** to demonstrate significant improvement in function at home and work related to shoulder pain  Baseline:  Goal status: INITIAL  2.  Pt will decrease worst shoulder pain by at least 3 points on the NPRS in order to demonstrate clinically significant reduction in shoulder pain. Baseline: *** Goal status: INITIAL  3.  Pt will decrease quick DASH score by at least 8% in order to demonstrate clinically significant reduction in disability related to shoulder pain        Baseline: *** Goal status: INITIAL  4. Pt will increase strength by at least 1/2 MMT grade in order to demonstrate improvement in strength and function         Baseline: *** Goal status: INITIAL   PLAN: PT FREQUENCY: 1-2x/week  PT DURATION: {rehab duration:25117}  PLANNED INTERVENTIONS: Therapeutic exercises, Therapeutic activity, Neuromuscular re-education, Balance training, Gait training, Patient/Family education, Self Care, Joint mobilization, Joint manipulation, Vestibular training, Canalith repositioning, Orthotic/Fit training, DME instructions, Dry Needling, Electrical stimulation, Spinal manipulation, Spinal mobilization, Cryotherapy, Moist heat, Taping, Traction, Ultrasound, Ionotophoresis 4mg /ml Dexamethasone, Manual therapy, and Re-evaluation.  PLAN FOR NEXT SESSION: ***   Lynnea Maizes PT, DPT, GCS  Gertie Exon, PT 01/15/2023, 9:30 AM

## 2023-01-29 NOTE — Therapy (Deleted)
 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/01/2022   Greater trochanteric pain syndrome of left lower extremity 02/01/2022   Spondylosis of lumbosacral region without myelopathy or radiculopathy 01/26/2022   Lumbar spondylosis 10/19/2021   Lumbar degenerative disc disease 10/19/2021   Chronic pain syndrome 10/19/2021   Axillary tenderness 09/14/2021   Need for Tdap vaccination 09/01/2021   Pain in axilla 09/01/2021   Healthcare maintenance 06/01/2021   Symptomatic mammary hypertrophy 05/04/2021   Left lumbosacral radiculopathy 05/04/2021   Hypertension 05/04/2021    PCP: Selinda JINNY Ku, MD  REFERRING PROVIDER: Selinda JINNY Ku, MD  REFERRING DIAG:  M75.101,M75.102 (ICD-10-CM) - Bilateral rotator cuff syndrome    RATIONALE FOR EVALUATION AND TREATMENT: Rehabilitation  THERAPY DIAG: No diagnosis found.  ONSET DATE: ***  FOLLOW-UP APPT SCHEDULED WITH REFERRING PROVIDER: {yes/no:20286}    SUBJECTIVE:                                                                                                                                                                                         SUBJECTIVE STATEMENT:  Pt is a 42 year old female referred for bilateral shoulder pain; bilateral rotator cuff syndrome in the setting of mammary hypertrophy and associated kyphotic postural changes per referring physician.   PERTINENT HISTORY:  Pt is a  42 year old female referred for bilateral shoulder pain; bilateral rotator cuff syndrome in the setting of mammary hypertrophy and associated kyphotic postural changes per referring physician.   Patient reports ***  PAIN:  Pain Intensity: Present: /10, Best: /10, Worst: /10 Pain location: *** Pain Quality: {PAIN DESCRIPTION:21022940}  Radiating: {yes/no:20286}  Numbness/Tingling: {yes/no:20286} Focal Weakness: {yes/no:20286} Aggravating factors: *** Relieving factors: *** 24-hour pain behavior: *** History of prior shoulder or neck/shoulder injury, pain, surgery, or therapy: {yes/no:20286} Falls: Has patient fallen in last 6 months? {yes/no:20286}, Number of falls: *** Dominant hand: {RIGHT/LEFT:20294} Imaging: {yes/no:20286}  Red flags (personal history of cancer, chills/fever, night sweats, nausea, vomiting, unrelenting pain, unexplained weight gain/loss): Negative  PRECAUTIONS: {Therapy precautions:24002}  WEIGHT BEARING RESTRICTIONS: {Yes ***/No:24003}  FALLS: Has patient fallen in last 6 months? {fallsyesno:27318}  Living Environment Lives  with: {OP[facility] lives with:25569::lives with their family} Lives in: {Lives in:25570} Stairs: {opstairs:27293} Has following equipment at home: {Assistive devices:23999}  Prior level of function: {PLOF:24004}  Occupational demands:   Hobbies:   Patient Goals: ***    OBJECTIVE:   Patient Surveys  FOTO: QuickDASH:  Cognition Patient is oriented to person, place, and time.  Recent memory is intact.  Remote memory is intact.  Attention span and concentration are intact.  Expressive speech is intact.  Patient's fund of knowledge is within normal limits for educational level.    Gross Musculoskeletal Assessment Tremor: None Bulk: Normal Tone: Normal  Gait   Posture   Cervical Screen AROM: WFL and painless with overpressure in all planes Spurlings A (ipsilateral lateral flexion/axial compression): R:  {NEGATIVE/POSITIVE QNM:80001} L: {NEGATIVE/POSITIVE QNM:80001} Spurlings B (ipsilateral lateral flexion/contralateral rotation/axial compression): R: {NEGATIVE/POSITIVE QNM:80001} L: {NEGATIVE/POSITIVE QNM:80001} Repeated movement: No centralization or peripheralization with protraction or retraction Hoffman Sign (cervical cord compression): R: {NEGATIVE/POSITIVE QNM:80001} L: {NEGATIVE/POSITIVE QNM:80001} ULTT Median: R: {NEGATIVE/POSITIVE QNM:80001} L: {NEGATIVE/POSITIVE QNM:80001} ULTT Ulnar: R: {NEGATIVE/POSITIVE QNM:80001} L: {NEGATIVE/POSITIVE QNM:80001} ULTT Radial: R: {NEGATIVE/POSITIVE QNM:80001} L: {NEGATIVE/POSITIVE QNM:80001}  AROM AROM (Normal range in degrees) AROM  Cervical  Flexion (50)   Extension (80)   Right lateral flexion (45)   Left lateral flexion (45)   Right rotation (85)   Left rotation (85)    Right Left  Shoulder    Flexion    Extension    Abduction    External Rotation    Internal Rotation    Hands Behind Head    Hands Behind Back        Elbow    Flexion    Extension    Pronation    Supination    (* = pain; Blank rows = not tested)  UE MMT: MMT (out of 5) Right Left   Cervical (isometric)  Flexion WNL  Extension WNL  Lateral Flexion WNL WNL  Rotation WNL WNL      Shoulder   Flexion    Extension    Abduction    External rotation    Internal rotation    Horizontal abduction    Horizontal adduction    Lower Trapezius    Rhomboids        Elbow  Flexion    Extension    Pronation    Supination        Wrist  Flexion    Extension    Radial deviation    Ulnar deviation        MCP  Flexion    Extension    Abduction    Adduction    (* = pain; Blank rows = not tested)  Sensation Grossly intact to light touch bilateral UE as determined by testing dermatomes C2-T2. Proprioception and hot/cold testing deferred on this date.  Reflexes R/L Biceps (L3/4): 2+/2+  Triceps (S1/2): 2+/2+  Brachioradialis:  2+/2  Palpation Location LEFT  RIGHT           Subocciptials    Cervical paraspinals    Upper Trapezius    Levator Scapulae    Rhomboid Major/Minor    Sternoclavicular joint    Acromioclavicular joint    Coracoid process    Long head of biceps    Supraspinatus    Infraspinatus    Subscapularis    Teres Minor    Teres Major    Pectoralis Major    Pectoralis Minor    Anterior Deltoid    Lateral Deltoid  Posterior Deltoid    Latissimus Dorsi    Sternocleidomastoid    (Blank rows = not tested) Graded on 0-4 scale (0 = no pain, 1 = pain, 2 = pain with wincing/grimacing/flinching, 3 = pain with withdrawal, 4 = unwilling to allow palpation), (Blank rows = not tested)   Passive Accessory Intervertebral Motion Pt denies reproduction of shoulder pain with CPA C2-T7 and UPA bilaterally C2-T7. Generally, hypomobile throughout  Accessory Motions/Glides Glenohumeral: Posterior: R: {normal/abnormal/not examined:14677} L: {normal/abnormal/not examined:14677} Inferior: R: {normal/abnormal/not examined:14677} L: {normal/abnormal/not examined:14677} Anterior: R: {normal/abnormal/not examined:14677} L: {normal/abnormal/not examined:14677}  Acromioclavicular:  Posterior: R: {normal/abnormal/not examined:14677} L: {normal/abnormal/not examined:14677} Anterior: R: {normal/abnormal/not examined:14677} L: {normal/abnormal/not examined:14677}  Sternoclavicular: Posterior: R: {normal/abnormal/not examined:14677} L: {normal/abnormal/not examined:14677} Anterior: R: {normal/abnormal/not examined:14677} L: {normal/abnormal/not examined:14677} Superior: R: {normal/abnormal/not examined:14677} L: {normal/abnormal/not examined:14677} Inferior: R: {normal/abnormal/not examined:14677} L: {normal/abnormal/not examined:14677}  Scapulothoracic: Distraction: R: {normal/abnormal/not examined:14677} L: {normal/abnormal/not examined:14677} Medial: R: {normal/abnormal/not examined:14677} L:  {normal/abnormal/not examined:14677} Lateral: R: {normal/abnormal/not examined:14677} L: {normal/abnormal/not examined:14677} Inferior: R: {normal/abnormal/not examined:14677} L: {normal/abnormal/not examined:14677} Superior: R: {normal/abnormal/not examined:14677} L: {normal/abnormal/not examined:14677} Upward Rotation: R: {normal/abnormal/not examined:14677} L: {normal/abnormal/not examined:14677} Downward Rotation: R: {normal/abnormal/not examined:14677} L: {normal/abnormal/not examined:14677}  Muscle Length Testing Pectoralis Major: R: {normal/abnormal/not examined:14677} L: {normal/abnormal/not examined:14677} Pectoralis Minor: R: {normal/abnormal/not examined:14677} L: {normal/abnormal/not examined:14677} Biceps: R: {normal/abnormal/not examined:14677} L: {normal/abnormal/not examined:14677}  SPECIAL TESTS Rotator Cuff  Drop Arm Test: {NEGATIVE/POSITIVE QNM:80001} Painful Arc (Pain from 60 to 120 degrees scaption): {NEGATIVE/POSITIVE QNM:80001} Infraspinatus Muscle Test: {NEGATIVE/POSITIVE QNM:80001} If all 3 tests positive, the probability of a full-thickness rotator cuff tear is 91%  Subacromial Impingement Hawkins-Kennedy: {NEGATIVE/POSITIVE QNM:80001} Neer (Block scapula, PROM flexion): {NEGATIVE/POSITIVE QNM:80001} Painful Arc (Pain from 60 to 120 degrees scaption): {NEGATIVE/POSITIVE QNM:80001} Empty Can: {NEGATIVE/POSITIVE QNM:80001} External Rotation Resistance: {NEGATIVE/POSITIVE QNM:80001} Horizontal Adduction: {NEGATIVE/POSITIVE QNM:80001} Scapular Assist: {NEGATIVE/POSITIVE QNM:80001} Positive Hawkins-Kennedy, Painful arc sign, Infraspinatus muscle test then +LR: 10.56 of some type of impingement present, 2/3 tests: +LR 5.06, -LR 0.17, Positive 3/5 Hawkins-Kennedy, neer, painful arc, empty can, and external rotation resistance then SN: .75 (.54-.96) SP: .74 (.61-.88) +LR: 2.93 (1.60-5.36) -LR: .34 (.14-.80)  Labral Tear Biceps Load II (120 elevation, full ER, 90 elbow  flexion, full supination, resisted elbow flexion): {NEGATIVE/POSITIVE QNM:80001} Crank (160 scaption, axial load with IR/ER): {NEGATIVE/POSITIVE QNM:80001} O'Briens/Active Compression Test (90 shoulder flexion, 10 adduction, full IR): {NEGATIVE/POSITIVE QNM:80001}  Bicep Tendon Pathology Speed (shoulder flexion to 90, external rotation, full elbow extension, and forearm supination with resistance: {NEGATIVE/POSITIVE QNM:80001} Yergason's (resisted shoulder ER and supination/biceps tendon pathology): {NEGATIVE/POSITIVE QNM:80001}  Shoulder Instability Sulcus Sign: {NEGATIVE/POSITIVE QNM:80001} Anterior Apprehension: {NEGATIVE/POSITIVE QNM:80001}  Beighton scale  LEFT  RIGHT           1. Passive dorsiflexion and hyperextension of the fifth MCP joint beyond 90  0 0   2. Passive apposition of the thumb to the flexor aspect of the forearm  0  0   3. Passive hyperextension of the elbow beyond 10  0  0   4. Passive hyperextension of the knee beyond 10  0  0   5. Active forward flexion of the trunk with the knees fully extended so that the palms of the hands rest flat on the floor   0   TOTAL         0/ 9      TODAY'S TREATMENT     Therapeutic Exercise - for HEP establishment, discussion on appropriate exercise/activity modification, PT education   Reviewed baseline home exercises and provided handout for [facility] program (see Access  Code); tactile cueing and therapist demonstration utilized as needed for carryover of proper technique to HEP.    Patient education on current condition, anatomy involved, prognosis, plan of care. Discussion on activity modification to prevent flare-up of condition, including ***.     PATIENT EDUCATION:  Education details: see above for patient education details Person educated: Patient Education method: Explanation, Demonstration, and Handouts Education comprehension: verbalized understanding and returned demonstration   HOME EXERCISE PROGRAM:   ***  ASSESSMENT:  CLINICAL IMPRESSION: Patient is a *** y.o. *** who was seen today for physical therapy evaluation and treatment for ***.   OBJECTIVE IMPAIRMENTS: {opptimpairments:25111}.   ACTIVITY LIMITATIONS: {activitylimitations:27494}  PARTICIPATION LIMITATIONS: {participationrestrictions:25113}  PERSONAL FACTORS: {Personal factors:25162} are also affecting patient's functional outcome.   REHAB POTENTIAL: {rehabpotential:25112}  CLINICAL DECISION MAKING: {clinical decision making:25114}  EVALUATION COMPLEXITY: {Evaluation complexity:25115}   GOALS: Goals reviewed with patient? Yes  SHORT TERM GOALS: Target date: {follow up:25551}  Pt will be independent with HEP to improve strength and decrease shoulder pain to improve pain-free function at home and work. Baseline: *** Goal status: INITIAL   LONG TERM GOALS: Target date: {follow up:25551}  Pt will increase FOTO to at least *** to demonstrate significant improvement in function at home and work related to shoulder pain  Baseline:  Goal status: INITIAL  2.  Pt will decrease worst shoulder pain by at least 3 points on the NPRS in order to demonstrate clinically significant reduction in shoulder pain. Baseline: *** Goal status: INITIAL  3.  Pt will decrease quick DASH score by at least 8% in order to demonstrate clinically significant reduction in disability related to shoulder pain        Baseline: *** Goal status: INITIAL  4. Pt will increase strength by at least 1/2 MMT grade in order to demonstrate improvement in strength and function         Baseline: *** Goal status: INITIAL   PLAN: PT FREQUENCY: 1-2x/week  PT DURATION: {rehab duration:25117}  PLANNED INTERVENTIONS: Therapeutic exercises, Therapeutic activity, Neuromuscular re-education, Balance training, Gait training, Patient/Family education, Self Care, Joint mobilization, Joint manipulation, Vestibular training, Canalith repositioning,  Orthotic/Fit training, DME instructions, Dry Needling, Electrical stimulation, Spinal manipulation, Spinal mobilization, Cryotherapy, Moist heat, Taping, Traction, Ultrasound, Ionotophoresis 4mg /ml Dexamethasone , Manual therapy, and Re-evaluation.  PLAN FOR NEXT SESSION: ***   Venetia Endo, PT, DPT [phone]  Venetia ONEIDA Endo, PT 01/29/2023, 12:42 PM

## 2023-01-31 ENCOUNTER — Ambulatory Visit: Payer: Medicaid Other | Attending: Physical Therapy | Admitting: Physical Therapy

## 2023-03-18 ENCOUNTER — Encounter: Payer: Self-pay | Admitting: Emergency Medicine

## 2023-03-18 ENCOUNTER — Ambulatory Visit
Admission: EM | Admit: 2023-03-18 | Discharge: 2023-03-18 | Disposition: A | Discharge status: Home / Self Care | Payer: Medicaid Other | Attending: Family Medicine | Admitting: Family Medicine

## 2023-03-18 DIAGNOSIS — J069 Acute upper respiratory infection, unspecified: Secondary | ICD-10-CM | POA: Insufficient documentation

## 2023-03-18 LAB — RESP PANEL BY RT-PCR (FLU A&B, COVID) ARPGX2
Influenza A by PCR: NEGATIVE
Influenza B by PCR: NEGATIVE
SARS Coronavirus 2 by RT PCR: NEGATIVE

## 2023-03-18 LAB — GROUP A STREP BY PCR: Group A Strep by PCR: NOT DETECTED

## 2023-03-18 NOTE — ED Provider Notes (Signed)
[facility]    CSN: [id number] Arrival date & time: 03/18/23  1058      History   Chief Complaint Chief Complaint  Patient presents with   Nasal Congestion   Sore Throat   Otalgia   Generalized Body Aches    HPI Dawn Hunter is a 43 y.o. female.   HPI  History obtained from the patient. Dawn Hunter presents for sore throat, left ear pain, body aches, headache, diarrhea that started on Friday.  No fever. Has some chest tightness and rhinorrhea.  Took sudafed, Mucinex, Aleve, lemon-herbal tea and cough syrup     Past Medical History:  Diagnosis Date   Anxiety 10/30/2013   Hypertension     Patient Active Problem List   Diagnosis Date Noted   Acute vaginitis 11/16/2022   Yeast dermatitis 11/16/2022   Bilateral rotator cuff syndrome 11/16/2022   Arthropathy of left sacroiliac joint 02/01/2022   Greater trochanteric pain syndrome of left lower extremity 02/01/2022   Spondylosis of lumbosacral region without myelopathy or radiculopathy 01/26/2022   Lumbar spondylosis 10/19/2021   Lumbar degenerative disc disease 10/19/2021   Chronic pain syndrome 10/19/2021   Axillary tenderness 09/14/2021   Need for Tdap vaccination 09/01/2021   Pain in axilla 09/01/2021   Healthcare maintenance 06/01/2021   Symptomatic mammary hypertrophy 05/04/2021   Left lumbosacral radiculopathy 05/04/2021   Hypertension 05/04/2021    Past Surgical History:  Procedure Laterality Date   CESAREAN SECTION     CHOLECYSTECTOMY  08/09/2013   TUBAL LIGATION      OB History     Gravida  5   Para  4   Term  4   Preterm      AB  1   Living  4      SAB      IAB      Ectopic      Multiple      Live Births               Home Medications    Prior to Admission medications   Not on File    Family History Family History  Problem Relation Age of Onset   Hypertension Mother    Arthritis Mother    Hypertension Father    Diabetes Father    Hypertension  Maternal Grandmother    Diabetes Maternal Grandmother    Hypertension Maternal Grandfather    Hypertension Paternal Grandmother    Diabetes Paternal Grandmother    Cancer Paternal Grandmother        Throat   Hypertension Paternal Grandfather    Cancer Paternal Grandfather    Breast cancer Cousin    Hypertension Son     Social History Social History   Tobacco Use   Smoking status: Never   Smokeless tobacco: Never  Vaping Use   Vaping status: Never Used  Substance Use Topics   Alcohol use: Not Currently   Drug use: Not Currently     Allergies   Patient has no known allergies.   Review of Systems Review of Systems: negative unless otherwise stated in HPI.      Physical Exam Triage Vital Signs ED Triage Vitals  Encounter Vitals Group     BP 03/18/23 1238 (!) 136/95     Systolic BP Percentile --      Diastolic BP Percentile --      Pulse Rate 03/18/23 1238 68     Resp 03/18/23 1238 18     Temp 03/18/23 1238  97.9 F (36.6 C)     Temp Source 03/18/23 1238 Oral     SpO2 03/18/23 1238 96 %     Weight --      Height --      Head Circumference --      Peak Flow --      Pain Score 03/18/23 1239 0     Pain Loc --      Pain Education --      Exclude from Growth Chart --    No data found.  Updated Vital Signs BP (!) 136/95 (BP Location: Left Arm)   Pulse 68   Temp 97.9 F (36.6 C) (Oral)   Resp 18   LMP 03/12/2023   SpO2 96%   Visual Acuity Right Eye Distance:   Left Eye Distance:   Bilateral Distance:    Right Eye Near:   Left Eye Near:    Bilateral Near:     Physical Exam GEN:     alert, non-toxic appearing female in no distress ***   HENT:  mucus membranes moist, oropharyngeal ***without lesions or ***erythema, no*** tonsillar hypertrophy or exudates, *** moderate erythematous edematous turbinates, ***clear nasal discharge, ***bilateral TM normal EYES:   pupils equal and reactive, ***no scleral injection or discharge NECK:  normal ROM, no  ***lymphadenopathy, ***no meningismus   RESP:  no increased work of breathing, ***clear to auscultation bilaterally CVS:   regular rate ***and rhythm Skin:   warm and dry, no rash on visible skin***    UC Treatments / Results  Labs (all labs ordered are listed, but only abnormal results are displayed) Labs Reviewed  GROUP A STREP BY PCR  RESP PANEL BY RT-PCR (FLU A&B, COVID) [id number]    EKG   Radiology No results found.  Procedures Procedures (including critical care time)  Medications Ordered in UC Medications - No data to display  Initial Impression / Assessment and Plan / UC Course  I have reviewed the triage vital signs and the nursing notes.  Pertinent labs & imaging results that were available during my care of the patient were reviewed by me and considered in my medical decision making (see chart for details).       Pt is a 43 y.o. female who presents for *** days of respiratory symptoms. Charmeka is ***afebrile here without recent antipyretics. Satting well on room air. Overall pt is ***non-toxic appearing, well hydrated, without respiratory distress. Pulmonary exam ***is unremarkable.  COVID and influenza panel obtained ***and was negative. ***Pt to quarantine until COVID test results or longer if positive.  I will call patient with test results, if positive. History consistent with ***viral respiratory illness. Discussed symptomatic treatment.  Explained lack of efficacy of antibiotics in viral disease.  Typical duration of symptoms discussed.   Return and ED precautions given and voiced understanding. Discussed MDM, treatment plan and plan for follow-up with patient*** who agrees with plan.     Final Clinical Impressions(s) / UC Diagnoses   Final diagnoses:  None   Discharge Instructions   None    ED Prescriptions   None    PDMP not reviewed this encounter.

## 2023-03-18 NOTE — Discharge Instructions (Addendum)
Your strep, COVID and influenza tests are negative. You have a viral respiratory infection that will gradually improve over the next 7-10 days. Cough may last up to 3 weeks.    You can take Tylenol and/or Ibuprofen as needed for fever reduction and pain relief.    For cough: honey 1/2 to 1 teaspoon (you can dilute the honey in water or another fluid).  You can also use guaifenesin and dextromethorphan for cough. You can use a humidifier for chest congestion and cough.  If you don't have a humidifier, you can sit in the bathroom with the hot shower running.      For sore throat: try warm salt water gargles, Mucinex sore throat cough drops or cepacol lozenges, throat spray, warm tea or water with lemon/honey, popsicles or ice, or OTC cold relief medicine for throat discomfort. You can also purchase chloraseptic spray at the pharmacy or dollar store.   For congestion: take a daily anti-histamine like Zyrtec, Claritin, and a oral decongestant, such as pseudoephedrine.  You can also use Flonase 1-2 sprays in each nostril daily. Afrin is also a good option, if you do not have high blood pressure.    It is important to stay hydrated: drink plenty of fluids (water, gatorade/powerade/pedialyte, juices, or teas) to keep your throat moisturized and help further relieve irritation/discomfort.    Return or go to the Emergency Department if symptoms worsen or do not improve in the next few days

## 2023-03-18 NOTE — ED Triage Notes (Signed)
Patient presents with c/o sore throat, nasal congestion, left ear discomfort x 4 days.

## 2023-04-17 ENCOUNTER — Ambulatory Visit
Admission: EM | Admit: 2023-04-17 | Discharge: 2023-04-17 | Disposition: A | Discharge status: Home / Self Care | Attending: Physician Assistant | Admitting: Physician Assistant

## 2023-04-17 ENCOUNTER — Ambulatory Visit (INDEPENDENT_AMBULATORY_CARE_PROVIDER_SITE_OTHER)

## 2023-04-17 DIAGNOSIS — T7840XA Allergy, unspecified, initial encounter: Secondary | ICD-10-CM | POA: Insufficient documentation

## 2023-04-17 DIAGNOSIS — L299 Pruritus, unspecified: Secondary | ICD-10-CM | POA: Diagnosis not present

## 2023-04-17 DIAGNOSIS — R220 Localized swelling, mass and lump, head: Secondary | ICD-10-CM | POA: Insufficient documentation

## 2023-04-17 DIAGNOSIS — R0789 Other chest pain: Secondary | ICD-10-CM | POA: Insufficient documentation

## 2023-04-17 DIAGNOSIS — Z113 Encounter for screening for infections with a predominantly sexual mode of transmission: Secondary | ICD-10-CM | POA: Diagnosis not present

## 2023-04-17 DIAGNOSIS — K068 Other specified disorders of gingiva and edentulous alveolar ridge: Secondary | ICD-10-CM | POA: Diagnosis not present

## 2023-04-17 LAB — COMPREHENSIVE METABOLIC PANEL
ALT: 16 U/L (ref 0–44)
AST: 15 U/L (ref 15–41)
Albumin: 4.2 g/dL (ref 3.5–5.0)
Alkaline Phosphatase: 55 U/L (ref 38–126)
Anion gap: 13 (ref 5–15)
BUN: 13 mg/dL (ref 6–20)
CO2: 20 mmol/L — ABNORMAL LOW (ref 22–32)
Calcium: 9.1 mg/dL (ref 8.9–10.3)
Chloride: 102 mmol/L (ref 98–111)
Creatinine, Ser: 0.73 mg/dL (ref 0.44–1.00)
GFR, Estimated: >60 mL/min (ref >60)
Glucose, Bld: 127 mg/dL — ABNORMAL HIGH (ref 70–99)
Potassium: 3.7 mmol/L (ref 3.5–5.1)
Sodium: 135 mmol/L (ref 135–145)
Total Bilirubin: 0.6 mg/dL (ref 0.0–1.2)
Total Protein: 7.7 g/dL (ref 6.5–8.1)

## 2023-04-17 LAB — CBC WITH DIFFERENTIAL/PLATELET
Abs Immature Granulocytes: 0.03 10*3/uL (ref 0.00–0.07)
Basophils Absolute: 0 10*3/uL (ref 0.0–0.1)
Basophils Relative: 0 %
Eosinophils Absolute: 0.1 10*3/uL (ref 0.0–0.5)
Eosinophils Relative: 1 %
HCT: 36.7 % (ref 36.0–46.0)
Hemoglobin: 13.1 g/dL (ref 12.0–15.0)
Immature Granulocytes: 0 %
Lymphocytes Relative: 17 %
Lymphs Abs: 1.5 10*3/uL (ref 0.7–4.0)
MCH: 32.3 pg (ref 26.0–34.0)
MCHC: 35.7 g/dL (ref 30.0–36.0)
MCV: 90.4 fL (ref 80.0–100.0)
Monocytes Absolute: 0.5 10*3/uL (ref 0.1–1.0)
Monocytes Relative: 5 %
Neutro Abs: 7 10*3/uL (ref 1.7–7.7)
Neutrophils Relative %: 77 %
Platelets: 296 10*3/uL (ref 150–400)
RBC: 4.06 MIL/uL (ref 3.87–5.11)
RDW: 12.7 % (ref 11.5–15.5)
WBC: 9.1 10*3/uL (ref 4.0–10.5)
nRBC: 0 % (ref 0.0–0.2)

## 2023-04-17 MED ORDER — DIPHENHYDRAMINE HCL 50 MG/ML IJ SOLN
50.0000 mg | Freq: Once | INTRAMUSCULAR | Status: AC
  Administered 2023-04-17: 50 mg via INTRAMUSCULAR

## 2023-04-17 MED ORDER — PREDNISONE 10 MG PO TABS: ORAL_TABLET | ORAL | 0 refills | Status: DC

## 2023-04-17 MED ORDER — DIPHENHYDRAMINE HCL 50 MG/ML IJ SOLN: 50.0000 mg | Freq: Once | INTRAMUSCULAR | Status: DC

## 2023-04-17 MED ORDER — DEXAMETHASONE SODIUM PHOSPHATE 10 MG/ML IJ SOLN
10.0000 mg | Freq: Once | INTRAMUSCULAR | Status: AC
  Administered 2023-04-17: 10 mg via INTRAMUSCULAR

## 2023-04-17 NOTE — ED Triage Notes (Signed)
 Patient thinks she's having an allergic reaction to imitation crab meat  Throat itching Chest tightness Pressure when she takes a deep breath Tongue feels funny

## 2023-04-17 NOTE — Discharge Instructions (Addendum)
-  Your workup today is reassuring. - You seem to have had unusual allergic reaction to something you consumed.  You were given an injection of Benadryl and dexamethasone corticosteroid in clinic. - Continue Benadryl 25 to 50 mg every 6 hours until symptoms resolved. -Start corticosteroid tomorrow -Increase rest and fluids -If symptoms worsen go to ER

## 2023-04-17 NOTE — ED Provider Notes (Signed)
 [facility]    CSN: [id number] Arrival date & time: 04/17/23  1725      History   Chief Complaint Chief Complaint  Patient presents with   Allergic Reaction    HPI Dawn Hunter is a 43 y.o. female presenting for concerns about possible allergic reaction to imitation crab meat.  States she had a salad around noon today which included a different imitation crab meat, cucumbers, eggs and crackers.  States not too long after that her entire mouth started to itch on the inside and she started have swelling around the back teeth and mildly increased discomfort with swallowing.  Also reports chest pressure but denies shortness of breath or wheezing.  No swelling of her face, throat, tongue or lips. No skin rashes. No history of allergic reactions.  Does not take any regular/routine medications.  Has not taken any Benadryl or other medications.  Cannot identify any other potential triggers.  Denies recently feeling ill.  Has not had fever, fatigue, cough, congestion, sore throat, chest pain, shortness of breath, abdominal pain, vomiting or diarrhea.  No history of bleeding or clotting disorders.  No other complaints.  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   Greater trochanteric pain syndrome of left lower extremity 02/01/2022   Spondylosis of lumbosacral region without myelopathy or radiculopathy 01/26/2022   Lumbar spondylosis 10/19/2021   Lumbar degenerative disc disease 10/19/2021   Chronic pain syndrome 10/19/2021   Axillary tenderness 09/14/2021   Need for Tdap vaccination 09/01/2021   Pain in axilla 09/01/2021   Healthcare maintenance 06/01/2021   Symptomatic mammary hypertrophy 05/04/2021   Left lumbosacral  radiculopathy 05/04/2021   Hypertension 05/04/2021    Past Surgical History:  Procedure Laterality Date   CESAREAN SECTION     CHOLECYSTECTOMY  08/09/2013   TUBAL LIGATION      OB History     Gravida  5   Para  4   Term  4   Preterm      AB  1   Living  4      SAB      IAB      Ectopic      Multiple      Live Births               Home Medications    Prior to Admission medications   Not on File    Family History Family History  Problem Relation Age of Onset   Hypertension Mother    Arthritis Mother    Hypertension Father    Diabetes Father    Hypertension Maternal Grandmother    Diabetes Maternal Grandmother    Hypertension Maternal Grandfather    Hypertension Paternal Grandmother    Diabetes Paternal Grandmother    Cancer Paternal Grandmother        Throat   Hypertension Paternal Grandfather    Cancer Paternal Grandfather    Breast cancer Cousin    Hypertension Son     Social History Social History   Tobacco Use   Smoking status: Never   Smokeless tobacco: Never  Vaping Use   Vaping status: Never Used  Substance Use Topics   Alcohol use: Not Currently   Drug use: Not  Currently     Allergies   Patient has no known allergies.   Review of Systems Review of Systems  Constitutional:  Negative for fatigue and fever.  HENT:  Positive for trouble swallowing. Negative for congestion, facial swelling, rhinorrhea and sore throat.        Pruritus of mouth  Respiratory:  Positive for chest tightness. Negative for shortness of breath and wheezing.   Cardiovascular:  Negative for chest pain.  Gastrointestinal:  Negative for abdominal pain, nausea and vomiting.  Skin:  Negative for rash.  Neurological:  Negative for dizziness, weakness, numbness and headaches.     Physical Exam Triage Vital Signs ED Triage Vitals  Encounter Vitals Group     BP 04/17/23 1738 (!) 131/96     Systolic BP Percentile --      Diastolic BP Percentile  --      Pulse Rate 04/17/23 1738 81     Resp 04/17/23 1738 16     Temp 04/17/23 1738 98.4 F (36.9 C)     Temp Source 04/17/23 1738 Oral     SpO2 04/17/23 1738 99 %     Weight --      Height --      Head Circumference --      Peak Flow --      Pain Score 04/17/23 1737 0     Pain Loc --      Pain Education --      Exclude from Growth Chart --    No data found.  Updated Vital Signs BP (!) 131/96 (BP Location: Left Arm)   Pulse 81   Temp 98.4 F (36.9 C) (Oral)   Resp 16   LMP 04/08/2023 (Exact Date)   SpO2 99%     Physical Exam Vitals and nursing note reviewed.  Constitutional:      General: She is not in acute distress.    Appearance: Normal appearance. She is not ill-appearing or toxic-appearing.  HENT:     Head: Normocephalic and atraumatic.     Nose: Nose normal.     Mouth/Throat:     Mouth: Mucous membranes are moist.     Pharynx: Oropharynx is clear.     Comments: See images included in chart. Patient has petechial rash of inner lip and hemorrhage of buccal mucosa near bilateral upper molars, swelling and tenderness of gingiva surrounding molars. No swelling of tongue or oropharynx. Eyes:     General: No scleral icterus.       Right eye: No discharge.        Left eye: No discharge.     Conjunctiva/sclera: Conjunctivae normal.  Cardiovascular:     Rate and Rhythm: Normal rate and regular rhythm.     Heart sounds: Normal heart sounds.  Pulmonary:     Effort: Pulmonary effort is normal. No respiratory distress.     Breath sounds: Normal breath sounds.  Musculoskeletal:     Cervical back: Neck supple.  Skin:    General: Skin is dry.     Comments: No skin rashes.     Neurological:     General: No focal deficit present.     Mental Status: She is alert. Mental status is at baseline.     Motor: No weakness.     Gait: Gait normal.  Psychiatric:        Mood and Affect: Mood normal.        Behavior: Behavior normal.  UC Treatments / Results   Labs (all labs ordered are listed, but only abnormal results are displayed) Labs Reviewed  CBC WITH DIFFERENTIAL/PLATELET  COMPREHENSIVE METABOLIC PANEL    EKG   Radiology No results found.  Procedures Procedures (including critical care time)  Medications Ordered in UC Medications  dexamethasone (DECADRON) injection 10 mg (10 mg Intramuscular Given 04/17/23 1802)  diphenhydrAMINE (BENADRYL) injection 50 mg (50 mg Intramuscular Given 04/17/23 1804)    Initial Impression / Assessment and Plan / UC Course  I have reviewed the triage vital signs and the nursing notes.  Pertinent labs & imaging results that were available during my care of the patient were reviewed by me and considered in my medical decision making (see chart for details).   43 year old female presents for pruritus throughout mouth, gingival swelling, mild difficulty swallowing and chest tightness for the past 6 hours.  Symptoms started after eating a salad with imitation crab meat.  Thinks she might be allergic to something she ate but cannot be sure.  Has not taken any medication to help with symptoms.  Denies any significant worsening of symptoms but states they have not improved.  BP elevated 131/96 but other vitals normal and stable.  Overall well-appearing.  See images included in chart.  Patient has petechial rash inside the lower lip and hemorrhage under skin of the buccal mucosa near bilateral upper molars.  Has some swelling around the molars and tenderness.  No other oral swelling.  No tongue swelling.  No facial swelling.  No rashes noted.  Chest clear.  Heart regular rate and rhythm.  Will obtain CBC and CMP given petechial type rash and hemorrhage under skin in mouth.  Also obtain chest x-ray given complaint of chest tightness.  Patient given 50 mg IM Benadryl and 10 mg IM dexamethasone for suspected allergic type reaction.   *** Final Clinical Impressions(s) / UC Diagnoses   Final diagnoses:   Allergic reaction, initial encounter   Discharge Instructions   None    ED Prescriptions   None    PDMP not reviewed this encounter.

## 2023-04-18 ENCOUNTER — Encounter: Payer: Self-pay | Admitting: Physician Assistant

## 2023-04-18 LAB — CYTOLOGY, (ORAL, ANAL, URETHRAL) ANCILLARY ONLY
Chlamydia: NEGATIVE
Comment: NEGATIVE
Comment: NORMAL
Neisseria Gonorrhea: NEGATIVE

## 2023-08-02 ENCOUNTER — Ambulatory Visit (INDEPENDENT_AMBULATORY_CARE_PROVIDER_SITE_OTHER)

## 2023-08-02 ENCOUNTER — Ambulatory Visit
Admission: EM | Admit: 2023-08-02 | Discharge: 2023-08-02 | Disposition: A | Discharge status: Home / Self Care | Attending: Family Medicine | Admitting: Family Medicine

## 2023-08-02 DIAGNOSIS — I878 Other specified disorders of veins: Secondary | ICD-10-CM | POA: Diagnosis not present

## 2023-08-02 DIAGNOSIS — R109 Unspecified abdominal pain: Secondary | ICD-10-CM | POA: Insufficient documentation

## 2023-08-02 LAB — URINALYSIS, COMPLETE (UACMP) WITH MICROSCOPIC
Bilirubin Urine: NEGATIVE
Glucose, UA: NEGATIVE mg/dL
Ketones, ur: NEGATIVE mg/dL
Leukocytes,Ua: NEGATIVE
Nitrite: NEGATIVE
Protein, ur: NEGATIVE mg/dL
Specific Gravity, Urine: 1.02 (ref 1.005–1.030)
pH: 6.5 (ref 5.0–8.0)

## 2023-08-02 MED ORDER — HYDROCODONE-ACETAMINOPHEN 5-325 MG PO TABS: 2.0000 | ORAL_TABLET | Freq: Three times a day (TID) | ORAL | 0 refills | Status: DC | PRN

## 2023-08-02 MED ORDER — KETOROLAC TROMETHAMINE 30 MG/ML IJ SOLN
30.0000 mg | Freq: Once | INTRAMUSCULAR | Status: AC
  Administered 2023-08-02: 30 mg via INTRAMUSCULAR

## 2023-08-02 MED ORDER — TAMSULOSIN HCL 0.4 MG PO CAPS: 0.4000 mg | ORAL_CAPSULE | Freq: Every day | ORAL | 0 refills | Status: DC

## 2023-08-02 NOTE — Discharge Instructions (Signed)
 Medications as prescribed. Strain urine.   If you worsen, please go directly to the ER. Follow up with PCP next week.  Take care  Dr. Bluford

## 2023-08-02 NOTE — ED Provider Notes (Signed)
 [facility]    CSN: [id number] Arrival date & time: 08/02/23  0857      History   Chief Complaint Chief Complaint  Patient presents with   Back Pain    HPI 43 year old female presents with left back/flank pain.  3 to 4-day history of left-sided low back pain/flank pain.  Radiates to the left lower abdomen.  Pain currently 7/10 in severity.  Associated urinary frequency.  Urinary symptoms have been present for the past 2 days.  Developed emesis this morning.  Has tried Azo and Aleve  with no relief.  No fever.  No history of kidney stone.  No other complaints or concerns at this time.  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   Spondylosis of lumbosacral region without myelopathy or radiculopathy 01/26/2022   Lumbar spondylosis 10/19/2021   Lumbar degenerative disc disease 10/19/2021   Chronic pain syndrome 10/19/2021   Healthcare maintenance 06/01/2021   Symptomatic mammary hypertrophy 05/04/2021   Left lumbosacral radiculopathy 05/04/2021   Hypertension 05/04/2021    Past Surgical History:  Procedure Laterality Date   CESAREAN SECTION     CHOLECYSTECTOMY  08/09/2013   TUBAL LIGATION      OB History     Gravida  5   Para  4   Term  4   Preterm      AB  1   Living  4      SAB      IAB      Ectopic      Multiple      Live Births               Home Medications    Prior to Admission medications   Not on File    Family History Family History  Problem Relation Age of Onset   Hypertension Mother    Arthritis Mother    Hypertension Father    Diabetes Father    Hypertension Maternal Grandmother    Diabetes Maternal Grandmother    Hypertension Maternal Grandfather    Hypertension Paternal Grandmother    Diabetes Paternal Grandmother     Cancer Paternal Grandmother        Throat   Hypertension Paternal Grandfather    Cancer Paternal Grandfather    Breast cancer Cousin    Hypertension Son     Social History Social History   Tobacco Use   Smoking status: Never   Smokeless tobacco: Never  Vaping Use   Vaping status: Never Used  Substance Use Topics   Alcohol use: Not Currently   Drug use: Not Currently     Allergies   Patient has no known allergies.   Review of Systems Review of Systems  Constitutional:  Negative for fever.  Gastrointestinal:  Positive for nausea and vomiting.  Genitourinary:  Positive for frequency.  Musculoskeletal:  Positive for back pain.    Physical Exam Triage Vital Signs ED Triage Vitals  Encounter Vitals Group     BP 08/02/23 0909 128/89     Girls Systolic BP Percentile --      Girls Diastolic BP Percentile --      Boys Systolic BP Percentile --      Boys Diastolic BP Percentile --      Pulse Rate 08/02/23 0909 77  Resp 08/02/23 0909 18     Temp 08/02/23 0909 97.9 F (36.6 C)     Temp Source 08/02/23 0909 Oral     SpO2 08/02/23 0909 96 %     Weight 08/02/23 0909 226 lb (102.5 kg)     Height 08/02/23 0909 5' 7 (1.702 m)     Head Circumference --      Peak Flow --      Pain Score 08/02/23 0907 7     Pain Loc --      Pain Education --      Exclude from Growth Chart --    No data found.  Updated Vital Signs BP 128/89 (BP Location: Right Arm)   Pulse 77   Temp 97.9 F (36.6 C) (Oral)   Resp 18   Ht 5' 7 (1.702 m)   Wt 102.5 kg   LMP 07/09/2023 (Approximate)   SpO2 96%   BMI 35.40 kg/m   Visual Acuity Right Eye Distance:   Left Eye Distance:   Bilateral Distance:    Right Eye Near:   Left Eye Near:    Bilateral Near:     Physical Exam Vitals and nursing note reviewed.  Constitutional:      Comments: Appears in pain.  Eyes:     General:        Right eye: No discharge.        Left eye: No discharge.     Conjunctiva/sclera: Conjunctivae  normal.  Cardiovascular:     Rate and Rhythm: Normal rate and regular rhythm.  Pulmonary:     Effort: Pulmonary effort is normal.     Breath sounds: Normal breath sounds. No wheezing, rhonchi or rales.  Abdominal:     Tenderness: There is left CVA tenderness.  Neurological:     Mental Status: She is alert.      UC Treatments / Results  Labs (all labs ordered are listed, but only abnormal results are displayed) Labs Reviewed  URINALYSIS, COMPLETE (UACMP) WITH MICROSCOPIC - Abnormal; Notable for the following components:      Result Value   Hgb urine dipstick MODERATE (*)    Bacteria, UA FEW (*)    All other components within normal limits    EKG   Radiology No results found.  Procedures Procedures (including critical care time)  Medications Ordered in UC Medications  ketorolac  (TORADOL ) 30 MG/ML injection 30 mg (has no administration in time range)    Initial Impression / Assessment and Plan / UC Course  I have reviewed the triage vital signs and the nursing notes.  Pertinent labs & imaging results that were available during my care of the patient were reviewed by me and considered in my medical decision making (see chart for details).    43 year old female presents with flank pain.  History and UA concerning for stone.  KUB was obtained and was negative for stone although this is a limited examination.  Discussed CT imaging at the hospital.  Patient did not want to go to the hospital.  Treating empirically for stone with Flomax .  Hydrocodone  as needed for pain.  Advised to go to the hospital if she develops fever or pain worsens.  She will strain urine and increase fluid intake.  Advised to follow-up with PCP this week.  Final Clinical Impressions(s) / UC Diagnoses   Final diagnoses:  Left flank pain   Discharge Instructions   None    ED Prescriptions   None    PDMP  not reviewed this encounter.   Amie Cowens G, DO 08/02/23 1030

## 2023-08-02 NOTE — ED Triage Notes (Signed)
 Patient presenting with low left back pain onset 3-4 days ago. No known falls or injuries. States having urinary frequency and then emesis this morning.   Prescriptions or OTC medications tried: Yes- AZO, Aleve     with no relief

## 2023-10-16 ENCOUNTER — Other Ambulatory Visit: Payer: Self-pay

## 2023-10-16 ENCOUNTER — Emergency Department
Admission: EM | Admit: 2023-10-16 | Discharge: 2023-10-16 | Disposition: A | Discharge status: Home / Self Care | Attending: Emergency Medicine | Admitting: Emergency Medicine

## 2023-10-16 ENCOUNTER — Emergency Department

## 2023-10-16 DIAGNOSIS — N281 Cyst of kidney, acquired: Secondary | ICD-10-CM | POA: Diagnosis not present

## 2023-10-16 DIAGNOSIS — I10 Essential (primary) hypertension: Secondary | ICD-10-CM | POA: Insufficient documentation

## 2023-10-16 DIAGNOSIS — K5730 Diverticulosis of large intestine without perforation or abscess without bleeding: Secondary | ICD-10-CM | POA: Diagnosis not present

## 2023-10-16 DIAGNOSIS — R109 Unspecified abdominal pain: Secondary | ICD-10-CM

## 2023-10-16 DIAGNOSIS — D259 Leiomyoma of uterus, unspecified: Secondary | ICD-10-CM | POA: Diagnosis not present

## 2023-10-16 LAB — URINALYSIS, ROUTINE W REFLEX MICROSCOPIC
Bilirubin Urine: NEGATIVE
Glucose, UA: NEGATIVE mg/dL
Ketones, ur: NEGATIVE mg/dL
Leukocytes,Ua: NEGATIVE
Nitrite: NEGATIVE
Protein, ur: NEGATIVE mg/dL
Specific Gravity, Urine: 1.018 (ref 1.005–1.030)
pH: 6 (ref 5.0–8.0)

## 2023-10-16 LAB — PREGNANCY, URINE: Preg Test, Ur: NEGATIVE

## 2023-10-16 MED ORDER — NAPROXEN 500 MG PO TABS: 500.0000 mg | ORAL_TABLET | Freq: Two times a day (BID) | ORAL | 0 refills | Status: DC

## 2023-10-16 MED ORDER — TAMSULOSIN HCL 0.4 MG PO CAPS: 0.4000 mg | ORAL_CAPSULE | Freq: Every day | ORAL | 0 refills | Status: DC

## 2023-10-16 NOTE — ED Provider Notes (Signed)
 [facility] Emergency Department Provider Note     None    (approximate)   History   Flank Pain   HPI  Dawn Hunter is a 43 y.o. female with a history of HTN and anxiety, presents to the ED endorsing renal colic.  She reports left flank pain that radiates into her groin.  She also reports some gross hematuria today in her urine.  She reports a history of kidney stones however, she has never been confirmed by either CT or ultrasound.  Patient reports she was evaluated and treated at a local urgent care last month for renal colic and flank pain.  She was treated empirically for kidney stones with tamsulosin  and oral pain medicines.  She was provided with a urine strainer, but denies any confirmed passage of kidney stones.  She presents today, denied any fevers, chills, sweats, nausea, vomiting, dizziness.  She does report some intermittent left flank pain and left pelvic discomfort.  No reports of any abnormal vaginal bleeding or vaginal discharge.  Physical Exam   Triage Vital Signs: ED Triage Vitals  Encounter Vitals Group     BP 10/16/23 1435 (!) 154/96     Girls Systolic BP Percentile --      Girls Diastolic BP Percentile --      Boys Systolic BP Percentile --      Boys Diastolic BP Percentile --      Pulse Rate 10/16/23 1435 92     Resp 10/16/23 1435 18     Temp 10/16/23 1435 98.2 F (36.8 C)     Temp src --      SpO2 10/16/23 1435 97 %     Weight 10/16/23 1433 230 lb (104.3 kg)     Height 10/16/23 1433 5' 7 (1.702 m)     Head Circumference --      Peak Flow --      Pain Score 10/16/23 1433 7     Pain Loc --      Pain Education --      Exclude from Growth Chart --     Most recent vital signs: Vitals:   10/16/23 1435  BP: (!) 154/96  Pulse: 92  Resp: 18  Temp: 98.2 F (36.8 C)  SpO2: 97%    General Awake, no distress. NAD HEENT NCAT. PERRL. EOMI. No rhinorrhea. Mucous membranes are moist.  CV:  Good peripheral perfusion.  RRR RESP:  Normal effort. CTA ABD:  No distention.  Soft and nontender.  Mild left flank tenderness appreciated. GU:  deferred   ED Results / Procedures / Treatments   Labs (all labs ordered are listed, but only abnormal results are displayed) Labs Reviewed  URINALYSIS, ROUTINE W REFLEX MICROSCOPIC - Abnormal; Notable for the following components:      Result Value   Color, Urine YELLOW (*)    APPearance HAZY (*)    Hgb urine dipstick LARGE (*)    Bacteria, UA RARE (*)    All other components within normal limits  PREGNANCY, URINE     EKG   RADIOLOGY  I personally viewed and evaluated these images as part of my medical decision making, as well as reviewing the written report by the radiologist.  ED Provider Interpretation: No evidence of renal or ureteral lithiasis.  Patient with a 6 L left lower pelvic uterine fibroid noted.  Diverticulosis without acute diverticulitis; stable bilateral renal cyst  CT Renal Stone Study Result Date: 10/16/2023 CLINICAL DATA:  Left  flank pain. EXAM: CT ABDOMEN AND PELVIS WITHOUT CONTRAST TECHNIQUE: Multidetector CT imaging of the abdomen and pelvis was performed following the standard protocol without IV contrast. RADIATION DOSE REDUCTION: This exam was performed according to the departmental dose-optimization program which includes automated exposure control, adjustment of the mA and/or kV according to patient size and/or use of iterative reconstruction technique. COMPARISON:  December 10, 2020 FINDINGS: Lower chest: No acute abnormality. Hepatobiliary: No focal liver abnormality is seen. Status post cholecystectomy. No biliary dilatation. Pancreas: Unremarkable. No pancreatic ductal dilatation or surrounding inflammatory changes. Spleen: Normal in size without focal abnormality. Adrenals/Urinary Tract: Adrenal glands are unremarkable. Kidneys are normal in size, without renal calculi or hydronephrosis. A 2.5 cm diameter cyst is seen within the mid  left kidney. A 3.1 cm cyst is seen within the lower pole of the right kidney. Bladder is unremarkable. Stomach/Bowel: Stomach is within normal limits. Appendix appears normal. No evidence of bowel wall thickening, distention, or inflammatory changes. Noninflamed diverticula are seen within the distal descending colon and proximal to mid left sigmoid colon. Vascular/Lymphatic: No significant vascular findings are present. No enlarged abdominal or pelvic lymph nodes. Reproductive: A 6.1 cm exophytic uterine fibroid is suspected along the lower uterine segment on the left. A 1.3 cm nabothian cyst is seen on the left. The bilateral adnexa are unremarkable. Other: No abdominal wall hernia or abnormality. No abdominopelvic ascites. Musculoskeletal: No acute or significant osseous findings. IMPRESSION: 1. Colonic diverticulosis. 2. 6.1 cm exophytic uterine fibroid. Correlation with nonemergent pelvic ultrasound is recommended. 3. Evidence of prior cholecystectomy. 4. Stable, bilateral renal cysts. No follow-up imaging is recommended. This recommendation follows ACR consensus guidelines: Management of the Incidental Renal Mass on CT: A White Paper of the ACR Incidental Findings Committee. J Am Coll Radiol [phone]. Electronically Signed   By: Suzen Dials M.D.   On: 10/16/2023 16:07     PROCEDURES:  Critical Care performed: No  Procedures   MEDICATIONS ORDERED IN ED: Medications - No data to display   IMPRESSION / MDM / ASSESSMENT AND PLAN / ED COURSE  I reviewed the triage vital signs and the nursing notes.                              Differential diagnosis includes, but is not limited to, ovarian cyst, ovarian torsion, acute appendicitis, diverticulitis, urinary tract infection/pyelonephritis, endometriosis, bowel obstruction, colitis, renal colic, gastroenteritis, hernia, fibroids, endometriosis, etc.  Patient's presentation is most consistent with acute complicated illness / injury  requiring diagnostic workup.  Patient's diagnosis is consistent with renal colic with high clinical concern for recently passed kidney stone.  Patient also with some left pelvic pain which may be related to recently confirmed 6 cm uterine fibroid.  Patient febrile at this time endorsing stable symptoms.  CT images interpreted by me, woke confirms uterine fibroid as well as stable benign renal cyst but no evidence of nephrolithiasis/ureterolithiasis.  No UA evidence of acute bacteriuria, microscopic hematuria is confirmed.  Patient will be discharged home with prescriptions for tamsulosin  and naproxen .  Patient has declined narcotic pain medicine prescription at this time.  Patient is to follow up with urology and GYN as suggested, as needed or otherwise directed. Patient is given ED precautions to return to the ED for any worsening or new symptoms.   FINAL CLINICAL IMPRESSION(S) / ED DIAGNOSES   Final diagnoses:  Flank pain  Uterine leiomyoma, unspecified location     Rx /  DC Orders   ED Discharge Orders          Ordered    tamsulosin  (FLOMAX ) 0.4 MG CAPS capsule  Daily        10/16/23 1630    naproxen  (NAPROSYN ) 500 MG tablet  2 times daily with meals        10/16/23 1630             Note:  This document was prepared using Dragon voice recognition software and may include unintentional dictation errors.    Loyd Candida LULLA Aldona, PA-C 10/16/23 2133    Dorothyann Drivers, MD 10/21/23 2245

## 2023-10-16 NOTE — Discharge Instructions (Addendum)
 Your CT scan does not show any evidence of kidney stones, but your symptoms and urine are suspicious for recent passage of a small stone.  You also do have incidental findings of benign renal cyst.  This does not require any further intervention or follow-up.  You do have evidence of a uterine fibroid in the lower pelvic region which is likely causing some of your left sided pelvic pain.  You should follow-up with GYN as suggested.  Take the prescription meds as directed.  Follow-up with your primary provider or return to the ED if needed.

## 2023-10-16 NOTE — ED Triage Notes (Signed)
 Left flank pain radiates to groin today had blood in urine. Hx of kidney stones

## 2023-10-21 ENCOUNTER — Telehealth: Payer: Self-pay

## 2023-10-21 NOTE — Telephone Encounter (Signed)
 Spoke with Hadassah at Centralized scheduling for radiology at [facility] who informed me there is no documentation that anyone from her office/ location called our office to request. I will reach out to patient for clarity.  JM   Copied from CRM [id number]. Topic: Clinical - Request for Lab/Test Order >> Oct 21, 2023 11:34 AM Dawn Hunter wrote: Reason for CRM: Centralized scheduling for radiology at [facility] calling to request order be faxed to them or put in epic. Patient has called to them to schedule and they do not have the order, needing order for MRI Lumbar Spine.   Requesting call once order has been placed, [phone]

## 2023-10-21 NOTE — Progress Notes (Unsigned)
 GYNECOLOGY PROGRESS NOTE  Subjective:  PCP: Alvia Selinda PARAS, MD  Patient ID: Dawn Hunter, female    DOB: 1980/03/05, 43 y.o.   MRN: [id number]  HPI  Patient is a 43 y.o. [id number] female who presents for follow-up of CT scan done in the ED 10/16/23 for flank pain.  Scan showed 6.6 cm exophytic uterine fibroid and 1.3cm nabothian cyst.  She has previously had a pelvic ultrasound done 03/02/22 which noted left ovarian cyst and left sided subserosal fibroid.  AUB for the past year, with worsening over the past 6-60mos, clots and intermenstrual spotting. Prior to this, periods were monthly, regular, 5 days, moderate flow. Also with pelvic pain, worse on the L, and feels bloated all the time, painful intercourse upon penetration in her L pelvis, lower abd. Also having some lower back pain, unsure if this is related, seeing PCP on 10/25/23 to address. Did a trial of OCPs about 1 year ago and they helped for one month and the bleeding returned heavy so she stopped taking them.  Recently lost a nephew to an overdose and is very against taking pills or medication at this time, realizes this is illogical, but that is her preference. She has already researched online and discussed with her husband, and has decided she wants a hysterectomy. Her older sister (51yo) had her uterus removed at 45yo for fibroids, her mother had fibroids as well.   The following portions of the patient's history were reviewed and updated as appropriate: allergies, current medications, past family history, past medical history, past social history, past surgical history, and problem list.  Review of Systems Pertinent items are noted in HPI.   Objective:   Blood pressure (!) 140/87, pulse 80, height 5' 7 (1.702 m), weight 225 lb (102.1 kg), last menstrual period 10/01/2023. Body mass index is 35.24 kg/m.  General appearance: alert, cooperative, and moderately obese Abdomen: LLQ tenderness, soft, no guarding or  distension Pelvic: cervix normal in appearance, external genitalia normal, no cervical motion tenderness, positive findings: adnexal mass on the left with tenderness, rectovaginal septum normal, uterus normal size, shape, and consistency, and vagina normal without discharge Extremities: extremities normal, atraumatic, no cyanosis or edema Neurologic: Grossly normal   CT Scan 10/16/23  Reproductive: A 6.1 cm exophytic uterine fibroid is suspected along the lower uterine segment on the left. A 1.3 cm nabothian cyst is seen on the left. The bilateral adnexa are unremarkable.  IMPRESSION: 1. Colonic diverticulosis. 2. 6.1 cm exophytic uterine fibroid. Correlation with nonemergent pelvic ultrasound is recommended. 3. Evidence of prior cholecystectomy. 4. Stable, bilateral renal cysts. No follow-up imaging is recommended. This recommendation follows ACR consensus guidelines: Management of the Incidental Renal Mass on CT: A White Paper of the ACR Incidental Findings Committee. J Am Coll Radiol [id number].   Pelvic U/S 03/02/22   FINDINGS: Uterusanteverted, 11 x 6 x 5 cm. The endometrium unremarkable, 8 mm. The uterine cavity is empty. Left-sided subserosal fibroid measures 4.2 x 4.1 x 3.7 cm.   Right ovary Unremarkable, 2.7 x 1.6 x 1.5 cm.   Left ovary 2.8 cm ovarian cyst with low-level internal echoes. Ovarian measurements 4.2 x 2.8 x 3.2 cm. Ultrasound follow-up recommended to ensure resolution or stability of the complex cyst left ovary as a precaution.   Images of the adnexae demonstrated no additional masses or fluid collections. Ovaries demonstrate blood flow with Doppler.   IMPRESSION: 1. Subserosal 4 cm left-sided uterine fibroid. 2. Complex cyst left ovary measuring 2.8 cm. Six  week ultrasound follow-up recommended to confirm resolution or stability as a precaution.  Assessment/Plan:   1. Subserous leiomyoma of uterus   2. Abnormal uterine bleeding (AUB)   3.  Pelvic pain     43 y.o. [id number] s/p BTL and with fibroids causing AUB/bulk symptoms, desiring hysterectomy. We reviewed medical therapy options and [place], pt declines and very much wants her uterus removed.  -New pelvic US  ordered for more accurate assessment -Referral placed to [facility] for surgical consult -Offered temporary medication for her bleeding symptoms while awaiting consult, declined -Bleeding precautions reviewed   Estil Mangle, DO [facility]

## 2023-10-22 ENCOUNTER — Inpatient Hospital Stay: Admitting: Family Medicine

## 2023-10-22 DIAGNOSIS — I10 Essential (primary) hypertension: Secondary | ICD-10-CM | POA: Diagnosis not present

## 2023-10-22 DIAGNOSIS — M5432 Sciatica, left side: Secondary | ICD-10-CM | POA: Diagnosis not present

## 2023-10-22 DIAGNOSIS — Z79899 Other long term (current) drug therapy: Secondary | ICD-10-CM | POA: Diagnosis not present

## 2023-10-22 DIAGNOSIS — F419 Anxiety disorder, unspecified: Secondary | ICD-10-CM | POA: Diagnosis not present

## 2023-10-23 ENCOUNTER — Encounter: Payer: Self-pay | Admitting: Obstetrics

## 2023-10-23 ENCOUNTER — Ambulatory Visit: Payer: Self-pay

## 2023-10-23 ENCOUNTER — Ambulatory Visit: Admitting: Obstetrics

## 2023-10-23 ENCOUNTER — Ambulatory Visit
Admission: RE | Admit: 2023-10-23 | Discharge: 2023-10-23 | Disposition: A | Discharge status: Home / Self Care | Source: Ambulatory Visit | Attending: Obstetrics | Admitting: Obstetrics

## 2023-10-23 VITALS — BP 140/87 | HR 80 | Ht 67.0 in | Wt 225.0 lb

## 2023-10-23 DIAGNOSIS — N888 Other specified noninflammatory disorders of cervix uteri: Secondary | ICD-10-CM | POA: Diagnosis not present

## 2023-10-23 DIAGNOSIS — D252 Subserosal leiomyoma of uterus: Secondary | ICD-10-CM | POA: Insufficient documentation

## 2023-10-23 DIAGNOSIS — D259 Leiomyoma of uterus, unspecified: Secondary | ICD-10-CM

## 2023-10-23 DIAGNOSIS — R102 Pelvic and perineal pain: Secondary | ICD-10-CM | POA: Diagnosis not present

## 2023-10-23 DIAGNOSIS — N939 Abnormal uterine and vaginal bleeding, unspecified: Secondary | ICD-10-CM | POA: Diagnosis not present

## 2023-10-23 DIAGNOSIS — D251 Intramural leiomyoma of uterus: Secondary | ICD-10-CM | POA: Diagnosis not present

## 2023-10-23 NOTE — Patient Instructions (Signed)
 Fibroids in the Uterus: What to Know  Fibroids are growths (tumors) in the uterus. Fibroids are not cancer. Most people with this condition do not need treatment. Sometimes, fibroids can make it hard to get pregnant. If this happens, you may need surgery to take out the fibroids. What are the causes? The cause of this condition is not known. What increases the risk? You're in your 30s or 40s and have not stopped having a period. You have family members who have had fibroids. You're of African American descent. You started your period at age 63 or younger. You've not given birth. You're overweight or you're obese. You eat a diet low in fruits, vegetables, and vitamin D . What are the signs or symptoms? Heavy bleeding during your period. Bleeding between periods. Pain in the area between your hips (pelvis). Pain during sex. Needing to pee right away or peeing more often than usual. Not being able to have children. Not being able to stay pregnant (miscarriage). Many people do not have symptoms. How is this treated? Treatment may include: Medicines to help with pain, such as aspirin or ibuprofen. Hormone treatment. This may be given as a pill, in a shot, or with a type of birth control device called an IUD. Surgery. This may be done to: Take out the fibroids. This may be done if you want to become pregnant. Take out the uterus. Stop the blood flow to the fibroids. Procedures to shrink the fibroids. This may be done with: Heat and radio energy. Ultrasound waves. Follow these instructions at home: Medicines Take your medicines only as told. You can lose a lot of iron because of heavy bleeding from your period. Ask your doctor if you should: Take iron pills. Eat more foods that have iron in them, such as dark green, leafy vegetables. Managing pain  Put heat on your back or belly as told. Use the heat source that your doctor recommends, such as a moist heat pack or a heating pad. Do  this as often as told. Put a towel between your skin and the heat source. Leave the heat on for 20-30 minutes. If your skin turns red, take off the heat right away to prevent burns. The risk of burns is higher if you can't feel pain, heat, or cold. General instructions Tell your doctor about any changes in your period, such as: Heavy bleeding that needs a change of tampons or pads more than normal. A change in how many days your period lasts. A change in symptoms that come with your period. This might be cramps in your belly or pain in your back. Keep all follow-up visits. Your doctor needs to check your fibroids for any changes. Contact a doctor if: You have pain that does not get better with medicine or heat. This may include pain or cramps in: The area between your hip bones. Your back. Your belly. You have new bleeding between your periods. You have more bleeding during or between your periods. You feel very tired or weak. You feel dizzy. Get help right away if: You faint. You have pain in the area between your hip bones that gets worse. You have bleeding that soaks a tampon or pad in 30 minutes or less. This information is not intended to replace advice given to you by your health care provider. Make sure you discuss any questions you have with your health care provider. Document Revised: 07/17/2022 Document Reviewed: 07/17/2022 Elsevier Patient Education  2024 [employer].

## 2023-10-23 NOTE — Telephone Encounter (Signed)
 FYI Only or Action Required?: FYI only for provider.  Patient was last seen in primary care on 11/15/2022 by Alvia Selinda PARAS, MD.  Called Nurse Triage reporting Flank Pain.  Symptoms began several days ago.  Interventions attempted: Rest, hydration, or home remedies.  Symptoms are: unchanged.  Triage Disposition: See PCP Within 2 Weeks (overriding See Physician Within 24 Hours)  Patient/caregiver understands and will follow disposition?:  Reason for Disposition  MODERATE pain (e.g., interferes with normal activities or awakens from sleep)  Answer Assessment - Initial Assessment Questions Patient reports no change from pain that she was seen in the ED for yesterday 10/22/23. States that when she could not get through via phone earlier, she went into the office and scheduled a hospital f/u for Friday.  Reviewed return to ED precautions per ED discharge notes as below and pt verbalized understanding.  You have sudden stiffness and heaviness on both buttocks down to both legs. You have numbness or weakness in one leg, or pain in both legs. You have numbness in your genital area or across your lower back. You cannot control your urine or bowel movements  States she would not be available to come in before Friday but will call with any changes. Has taken aleve  and is going to attempt to rest.  Protocols used: Flank Pain-A-AH   Copied from CRM #[id number]. Topic: Clinical - Red Word Triage >> Oct 23, 2023  9:13 AM Zy'onna H wrote: [facility] that prompted transfer to Nurse Triage:  Patient is having the following symptoms -  Pain in her back, left-side(lower back) - soreness, tender  Transfer to NT >> Oct 23, 2023  9:28 AM Zy'onna H wrote: After having the patient on hold twice, the patient disconnected the call, due to not being able to provide her with a nurse on the line.  I attempted to call the Nurse Triage another time, but was waiting for over 7 minutes.   Patient  disconnected. No transfer to NT

## 2023-10-24 ENCOUNTER — Ambulatory Visit: Payer: Self-pay | Admitting: Obstetrics

## 2023-10-25 ENCOUNTER — Ambulatory Visit (INDEPENDENT_AMBULATORY_CARE_PROVIDER_SITE_OTHER): Admitting: Family Medicine

## 2023-10-25 VITALS — BP 130/90 | HR 91 | Ht 67.0 in | Wt 224.0 lb

## 2023-10-25 DIAGNOSIS — N62 Hypertrophy of breast: Secondary | ICD-10-CM | POA: Diagnosis not present

## 2023-10-25 DIAGNOSIS — M47816 Spondylosis without myelopathy or radiculopathy, lumbar region: Secondary | ICD-10-CM

## 2023-10-25 MED ORDER — CELECOXIB 200 MG PO CAPS: ORAL_CAPSULE | ORAL | 2 refills | Status: DC

## 2023-10-25 NOTE — Patient Instructions (Signed)
 VISIT SUMMARY:  You visited us  today to discuss your chronic back pain, neck and shoulder pain, and the impact these issues have on your daily activities and weight management. We have outlined a plan to address your pain and referred you to specialists for further evaluation.  YOUR PLAN:  CHRONIC LOW BACK PAIN: You have been experiencing chronic low back pain with severe episodes, previously misdiagnosed as kidney stones. -We will order an MRI of your lumbar spine without contrast to determine the underlying cause of your pain. -You are prescribed Celebrex  200 mg once daily with food, with the option to take a second dose if needed. Be aware of potential gastrointestinal side effects. -You are also prescribed Flexeril  5 mg, which you can take up to 10 mg, three times a day as needed. -Please complete your current Medrol pack before starting the new medications. -We will refer you to an interventional spine group for potential spine injections. -Once your pain is managed, we encourage you to start physical therapy.  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 -We will refer you to plastic surgery for an evaluation and potential breast reduction.

## 2023-10-25 NOTE — Assessment & Plan Note (Signed)
 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  - Refer to plastic surgery for evaluation and potential breast reduction.

## 2023-10-25 NOTE — Progress Notes (Signed)
 Primary Care / Sports Medicine Office Visit  Patient Information:  Patient ID: Dawn Hunter, female DOB: 30-Aug-1980 Age: 43 y.o. MRN: [id number]   Dawn Hunter is a pleasant 43 y.o. female presenting with the following:  Chief Complaint  Patient presents with   Back Pain    Patient presents today for upper and lower back pain. Her entire back is painful for years now she has tried therapy but it continues to hurt. She takes aleve  , bayer back pain but this does not help. She would like a referral and requesting MRI for back.     Vitals:   10/25/23 0928  BP: (!) 130/90  Pulse: 91  SpO2: 97%   Vitals:   10/25/23 0928  Weight: 224 lb (101.6 kg)  Height: 5' 7 (1.702 m)   Body mass index is 35.08 kg/m.   Discussed the use of AI scribe software for clinical note transcription with the patient, who gave verbal consent to proceed.   Independent interpretation of notes and tests performed by another provider:   None  Procedures performed:   None  Pertinent History, Exam, Impression, and Recommendations:   Problem List Items Addressed This Visit     Lumbar spondylosis (Chronic)   Dawn Hunter is a 43 year old female who presents with chronic back pain.  Chronic back pain - Chronic back pain with initial presentation as muscle tightening, preventing upright posture for one week - Pain has since improved, allowing upright posture - Previous emergency room visit resulted in diagnosis of kidney stones; treatment did not alleviate back pain - Current medications include Medrol pack, Flexeril  5 mg (effective and not too potent), Aleve , and 'better back and pain' medication - History of recurrent back pain for years, with prior physical therapy and spine injections providing only temporary relief (pain recurs after several months) - Physical activity, such as walking with her children, exacerbates back pain and leaves her feeling worse afterwards  Chronic low back  pain Chronic low back pain with severe exacerbations, previously misdiagnosed as kidney stones. - Order MRI of lumbar spine without contrast. - Prescribe Celebrex  200 mg once daily with food, option for second dose if needed. Discussed potential gastrointestinal side effects. - Prescribe Flexeril  5 mg, up to 10 mg, three times a day as needed. - Advise completion of current Medrol pack before starting new medications. - Refer to interventional spine group for potential spine injections. - Encourage physical therapy post-pain management.      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  - Refer to plastic surgery for evaluation and potential breast reduction.      Relevant Orders   Ambulatory referral to Plastic Surgery     Orders & Medications Medications:  Meds ordered this encounter  Medications   celecoxib  (CELEBREX ) 200 MG capsule    Sig: Take 1 tablet every AM with food. Can dose second tablet in PM with food PRN pain    Dispense:  60 capsule    Refill:  2  Orders Placed This Encounter  Procedures   MR Lumbar Spine Wo Contrast   Ambulatory referral to Plastic Surgery   Ambulatory referral to Pain Clinic     No follow-ups on file.     Dawn JINNY Ku, MD, [facility]   Primary Care Sports Medicine Primary Care and Sports Medicine at [facility]

## 2023-10-25 NOTE — Assessment & Plan Note (Signed)
 Dawn Hunter is a 43 year old female who presents with chronic back pain.  Chronic back pain - Chronic back pain with initial presentation as muscle tightening, preventing upright posture for one week - Pain has since improved, allowing upright posture - Previous emergency room visit resulted in diagnosis of kidney stones; treatment did not alleviate back pain - Current medications include Medrol pack, Flexeril  5 mg (effective and not too potent), Aleve , and 'better back and pain' medication - History of recurrent back pain for years, with prior physical therapy and spine injections providing only temporary relief (pain recurs after several months) - Physical activity, such as walking with her children, exacerbates back pain and leaves her feeling worse afterwards  Chronic low back pain Chronic low back pain with severe exacerbations, previously misdiagnosed as kidney stones. - Order MRI of lumbar spine without contrast. - Prescribe Celebrex  200 mg once daily with food, option for second dose if needed. Discussed potential gastrointestinal side effects. - Prescribe Flexeril  5 mg, up to 10 mg, three times a day as needed. - Advise completion of current Medrol pack before starting new medications. - Refer to interventional spine group for potential spine injections. - Encourage physical therapy post-pain management.

## 2023-11-05 ENCOUNTER — Ambulatory Visit
Admission: RE | Admit: 2023-11-05 | Discharge: 2023-11-05 | Disposition: A | Discharge status: Home / Self Care | Source: Ambulatory Visit | Attending: Family Medicine | Admitting: Family Medicine

## 2023-11-05 DIAGNOSIS — M47816 Spondylosis without myelopathy or radiculopathy, lumbar region: Secondary | ICD-10-CM | POA: Diagnosis present

## 2023-11-08 ENCOUNTER — Telehealth: Payer: Self-pay

## 2023-11-08 NOTE — Telephone Encounter (Signed)
 Copied from CRM [id number]. Topic: Clinical - Lab/Test Results >> Nov 08, 2023  9:53 AM Turkey B wrote: Reason for CRM: Patient called in for imaging results of spine, no result notes yetl, Please cb patient when result notes are in

## 2023-11-11 ENCOUNTER — Ambulatory Visit: Payer: Self-pay | Admitting: Family Medicine

## 2023-11-12 ENCOUNTER — Ambulatory Visit: Admitting: Family Medicine

## 2023-11-12 VITALS — BP 143/88 | HR 91 | Ht 67.0 in | Wt 231.0 lb

## 2023-11-12 DIAGNOSIS — R102 Pelvic and perineal pain unspecified side: Secondary | ICD-10-CM | POA: Diagnosis not present

## 2023-11-12 DIAGNOSIS — N939 Abnormal uterine and vaginal bleeding, unspecified: Secondary | ICD-10-CM | POA: Diagnosis not present

## 2023-11-12 DIAGNOSIS — D252 Subserosal leiomyoma of uterus: Secondary | ICD-10-CM

## 2023-11-12 NOTE — Progress Notes (Signed)
   Subjective:    Patient ID: Dawn Hunter is a 43 y.o. female presenting with Fibroids (AUB and fibroids )  on 11/12/2023  HPI: Has h/o heavy cycles and fibroids. Was on birth control x 3 months and having heavy cycles. Flared up again after stopping the birth control. S/p EMB in 2024 with negative results. U/s shows fibroid 6.5 cm. Overall uterine size is 11 cm.  [id number] SVD x 2 and C-s x 2. S/p BTL.  Review of Systems  Constitutional:  Negative for chills and fever.  Respiratory:  Negative for shortness of breath.   Cardiovascular:  Negative for chest pain.  Gastrointestinal:  Negative for abdominal pain, nausea and vomiting.  Genitourinary:  Negative for dysuria.  Skin:  Negative for rash.      Objective:    BP (!) 143/88 (BP Location: Left Arm, Patient Position: Sitting, Cuff Size: Normal)   Pulse 91   Ht 5' 7 (1.702 m)   Wt 231 lb (104.8 kg)   LMP 10/01/2023 (Exact Date)   BMI 36.18 kg/m  Physical Exam Exam conducted with a chaperone present.  Constitutional:      General: She is not in acute distress.    Appearance: She is well-developed.  HENT:     Head: Normocephalic and atraumatic.  Eyes:     General: No scleral icterus. Cardiovascular:     Rate and Rhythm: Normal rate.  Pulmonary:     Effort: Pulmonary effort is normal.  Abdominal:     Palpations: Abdomen is soft.  Musculoskeletal:     Cervical back: Neck supple.  Skin:    General: Skin is warm and dry.  Neurological:     Mental Status: She is alert and oriented to person, place, and time.        Assessment & Plan:   Problem List Items Addressed This Visit       Unprioritized   Subserous leiomyoma of uterus   Offered sonata, UFE and patient strongly desires hysterectomy. Will proceed with TVH, possible bilateral salpingectomy. Risks include but are not limited to bleeding, infection, injury to surrounding structures, including bowel, bladder (especially with 2 prior c-sections) and ureters,  blood clots, and death.  Likelihood of success is high. Consent signed today.       Relevant Orders   Ambulatory Referral For Surgery Scheduling   Abnormal uterine bleeding (AUB) - Primary   Recent pap 2023 WNL, recent EMB 2024 WNL. Offered POP's, cyclic progestin, Lysteda, IUD, depo, endometrial ablation. Pt. Declined. Interested only in hysterectomy.      Relevant Orders   Ambulatory Referral For Surgery Scheduling   Pelvic pain   Likely related to fibroids.      Relevant Orders   Ambulatory Referral For Surgery Scheduling     Return in about 3 months (around 02/12/2024) for postop check.  Dawn GORMAN Birk, MD 11/12/2023 2:39 PM

## 2023-11-12 NOTE — Assessment & Plan Note (Addendum)
 Offered sonata, UFE and patient strongly desires hysterectomy. Will proceed with TVH, possible bilateral salpingectomy. Discussed possible need for TAH if uterus is stuck to anterior abdominal wall. No op notes available. Risks include but are not limited to bleeding, infection, injury to surrounding structures, including bowel, bladder (especially with 2 prior c-sections) and ureters, blood clots, and death.  Likelihood of success is high. Consent signed today.

## 2023-11-12 NOTE — Assessment & Plan Note (Signed)
 Recent pap 2023 WNL, recent EMB 2024 WNL. Offered POP's, cyclic progestin, Lysteda, IUD, depo, endometrial ablation. Pt. Declined. Interested only in hysterectomy.

## 2023-11-12 NOTE — Assessment & Plan Note (Signed)
Likely related to fibroids

## 2023-11-19 ENCOUNTER — Ambulatory Visit: Payer: Self-pay | Admitting: Family Medicine

## 2023-11-19 VITALS — BP 96/64 | HR 75 | Temp 98.1°F | Ht 67.0 in | Wt 224.6 lb

## 2023-11-19 DIAGNOSIS — Z1231 Encounter for screening mammogram for malignant neoplasm of breast: Secondary | ICD-10-CM | POA: Diagnosis not present

## 2023-11-19 DIAGNOSIS — M47816 Spondylosis without myelopathy or radiculopathy, lumbar region: Secondary | ICD-10-CM | POA: Diagnosis not present

## 2023-11-19 DIAGNOSIS — F419 Anxiety disorder, unspecified: Secondary | ICD-10-CM | POA: Diagnosis not present

## 2023-11-19 DIAGNOSIS — E66812 Obesity, class 2: Secondary | ICD-10-CM | POA: Insufficient documentation

## 2023-11-19 DIAGNOSIS — Z0000 Encounter for general adult medical examination without abnormal findings: Secondary | ICD-10-CM

## 2023-11-19 DIAGNOSIS — F330 Major depressive disorder, recurrent, mild: Secondary | ICD-10-CM

## 2023-11-19 DIAGNOSIS — Z23 Encounter for immunization: Secondary | ICD-10-CM

## 2023-11-19 DIAGNOSIS — Z6835 Body mass index (BMI) 35.0-35.9, adult: Secondary | ICD-10-CM

## 2023-11-19 DIAGNOSIS — N939 Abnormal uterine and vaginal bleeding, unspecified: Secondary | ICD-10-CM | POA: Diagnosis not present

## 2023-11-19 DIAGNOSIS — G894 Chronic pain syndrome: Secondary | ICD-10-CM | POA: Diagnosis not present

## 2023-11-19 DIAGNOSIS — D252 Subserosal leiomyoma of uterus: Secondary | ICD-10-CM

## 2023-11-19 MED ORDER — DULOXETINE HCL 30 MG PO CPEP: 30.0000 mg | ORAL_CAPSULE | Freq: Every day | ORAL | 0 refills | Status: DC

## 2023-11-19 MED ORDER — DULOXETINE HCL 60 MG PO CPEP: 60.0000 mg | ORAL_CAPSULE | Freq: Every day | ORAL | 0 refills | Status: DC

## 2023-11-19 NOTE — Assessment & Plan Note (Signed)
 Psychological stress and sleep disturbance - Significant stress impacting daily functioning - Feels 'drained' at work - Difficulty initiating and maintaining sleep; describes sleep as restless - No history of medication use for stress - No history of suicidal thoughts or behaviors  Stress and insomnia Stress and insomnia likely exacerbated by chronic pain. Duloxetine  considered for stress and pain management. Discussed stress impact on sleep and duloxetine 's gradual effects. - Prescribe duloxetine  30 mg daily for one week, then 60 mg daily. - Discuss duloxetine  side effects, advise taking with food if needed. - Plan follow-up to assess medication efficacy and side effects.

## 2023-11-19 NOTE — Progress Notes (Signed)
 Annual Physical Exam Visit  Patient Information:  Patient ID: Dawn Hunter, female DOB: 1980-10-30 Age: 43 y.o. MRN: [id number]   Subjective:   CC: Annual Physical Exam  HPI:  Dawn Hunter is here for their annual physical.  I reviewed the past medical history, family history, social history, surgical history, and allergies today and changes were made as necessary.  Please see the problem list section below for additional details.  Past Medical History: Past Medical History:  Diagnosis Date   Anxiety 10/30/2013   Hypertension    Past Surgical History: Past Surgical History:  Procedure Laterality Date   CESAREAN SECTION     CHOLECYSTECTOMY  08/09/2013   TUBAL LIGATION     Family History: Family History  Problem Relation Age of Onset   Hypertension Mother    Arthritis Mother    Hypertension Father    Diabetes Father    Hypertension Maternal Grandmother    Diabetes Maternal Grandmother    Hypertension Maternal Grandfather    Hypertension Paternal Grandmother    Diabetes Paternal Grandmother    Cancer Paternal Grandmother        Throat   Hypertension Paternal Grandfather    Cancer Paternal Grandfather    Breast cancer Cousin    Hypertension Son    Allergies: No Known Allergies Health Maintenance: Health Maintenance  Topic Date Due   Mammogram  09/27/2023   Hepatitis B Vaccines 19-59 Average Risk (1 of 3 - 19+ 3-dose series) 11/18/2024 (Originally 07/05/1999)   HPV VACCINES (1 - 3-dose SCDM series) 11/18/2024 (Originally 07/05/2007)   Cervical Cancer Screening (HPV/Pap Cotest)  06/02/2026   DTaP/Tdap/Td (2 - Td or Tdap) 09/02/2031   Influenza Vaccine  Completed   Hepatitis C Screening  Completed   HIV Screening  Completed   Pneumococcal Vaccine  Aged Out   Meningococcal B Vaccine  Aged Out   COVID-19 Vaccine  Discontinued    HM Colonoscopy   This patient has no relevant Health Maintenance data.    Medications: No current outpatient medications on  file prior to visit.   No current facility-administered medications on file prior to visit.    Discussed the use of AI scribe software for clinical note transcription with the patient, who gave verbal consent to proceed.   Objective:   Vitals:   11/19/23 0959  BP: 96/64  Pulse: 75  Temp: 98.1 F (36.7 C)  SpO2: 97%   Vitals:   11/19/23 0959  Weight: 224 lb 9.6 oz (101.9 kg)  Height: 5' 7 (1.702 m)   Body mass index is 35.18 kg/m.  General: Well Developed, well nourished, and in no acute distress.  Neuro: Alert and oriented x3, extra-ocular muscles intact, sensation grossly intact. Cranial nerves II through XII are grossly intact, motor, sensory, and coordinative functions are intact. HEENT: Normocephalic, atraumatic, neck supple, no masses, no lymphadenopathy, thyroid  nonenlarged. Oropharynx, nasopharynx, external ear canals are unremarkable. Skin: Warm and dry, no rashes noted.  Cardiac: Regular rate and rhythm, no murmurs rubs or gallops. No peripheral edema. Pulses symmetric. Respiratory: Clear to auscultation bilaterally. Speaking in full sentences.  Abdominal: Soft, nontender, nondistended, positive bowel sounds, no masses, no organomegaly. Musculoskeletal: Stable, and with full range of motion.  Impression and Recommendations:   The patient was counselled, risk factors were discussed, and anticipatory guidance given.  Problem List Items Addressed This Visit     Abnormal uterine bleeding (AUB)   See additional assessment(s) for plan details.      Chronic  pain syndrome   See additional assessment(s) for plan details.      Relevant Medications   DULoxetine  (CYMBALTA ) 30 MG capsule   DULoxetine  (CYMBALTA ) 60 MG capsule   Class 2 severe obesity due to excess calories with serious comorbidity and body mass index (BMI) of 35.0 to 35.9 in adult   Obesity Obesity increases spinal pressure, exacerbating back pain. Weight loss recommended to alleviate spinal pressure  and improve health. - Encourage weight loss through diet and exercise. - Discuss benefits of weight loss on spinal health and well-being.      Healthcare maintenance - Primary   Annual examination completed, risk stratification labs ordered, anticipatory guidance provided.  We will follow labs once resulted.      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 MRI shows severe arthritis without stenosis or neural impingement. Pain impacts daily activities. Previous injections provided temporary relief. Discussed duloxetine  for pain management and stress relief. - Review MRI results with her. - Discuss potential benefits of breast reduction surgery in alleviating spinal pressure. - Consider spine injections for temporary pain relief. - Encourage weight loss to reduce spinal pressure. - Prescribe duloxetine  30 mg daily for one week, then 60 mg daily.      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 Duloxetine  considered for stress and pain management. Discussed stress impact on sleep and duloxetine 's gradual effects. - Prescribe duloxetine  30 mg daily for one week, then 60 mg daily. - Discuss duloxetine  side effects, advise taking with food if needed. - Plan follow-up to assess medication efficacy and side effects.      Relevant Medications   DULoxetine  (CYMBALTA ) 30 MG capsule   DULoxetine  (CYMBALTA ) 60 MG capsule   Subserous leiomyoma of uterus   Menorrhagia and uterine fibroids - Continues to experience heavy menstrual bleeding attributed to uterine fibroids - Heavy bleeding contributes to fatigue - Scheduled for hysterectomy next year  Abnormal uterine bleeding due to uterine fibroids Abnormal uterine bleeding from fibroids causing heavy cycles and potential anemia. Scheduled for hysterectomy next year. Discussed monitoring for anemia and iron supplementation. - Monitor for anemia signs. - Order blood work to assess anemia. - Discuss iron supplementation if anemia confirmed.      Other Visit Diagnoses       Vaginal bleeding       Relevant Orders  Iron, TIBC and Ferritin Panel     Breast cancer screening by mammogram       Relevant Orders   MM DIGITAL SCREENING BILATERAL     Encounter for immunization       Relevant Orders   Flu vaccine trivalent PF, 6mos and older(Flulaval,Afluria,Fluarix,Fluzone) (Completed)        Orders & Medications Medications:  Meds ordered this encounter  Medications   DULoxetine  (CYMBALTA ) 30 MG capsule    Sig: Take 1 capsule (30 mg total) by mouth daily for 7 days. Then increase to duloxetine  60 mg.    Dispense:  7 capsule    Refill:  0   DULoxetine  (CYMBALTA ) 60 MG capsule    Sig: Take 1 capsule (60 mg total) by mouth daily.    Dispense:  90 capsule     Refill:  0   Orders Placed This Encounter  Procedures   MM DIGITAL SCREENING BILATERAL   Flu vaccine trivalent PF, 6mos and older(Flulaval,Afluria,Fluarix,Fluzone)   CBC   Comprehensive metabolic panel with GFR   Hemoglobin A1c   Lipid panel   Iron, TIBC and Ferritin Panel     Return in about 8 weeks (around 01/15/2024) for on/after 12/17 for medicaiton follow-up.    Selinda JINNY Ku, MD, [facility]   Primary Care Sports Medicine Primary [facility] and [facility] at [facility]

## 2023-11-19 NOTE — Assessment & Plan Note (Signed)
 Obesity Obesity increases spinal pressure, exacerbating back pain. Weight loss recommended to alleviate spinal pressure and improve health. - Encourage weight loss through diet and exercise. - Discuss benefits of weight loss on spinal health and well-being.

## 2023-11-19 NOTE — Assessment & Plan Note (Signed)
 Annual examination completed, risk stratification labs ordered, anticipatory guidance provided.  We will follow labs once resulted.

## 2023-11-19 NOTE — Assessment & Plan Note (Signed)
 See additional assessment(s) for plan details.

## 2023-11-19 NOTE — Assessment & Plan Note (Signed)
 Menorrhagia and uterine fibroids - Continues to experience heavy menstrual bleeding attributed to uterine fibroids - Heavy bleeding contributes to fatigue - Scheduled for hysterectomy next year  Abnormal uterine bleeding due to uterine fibroids Abnormal uterine bleeding from fibroids causing heavy cycles and potential anemia. Scheduled for hysterectomy next year. Discussed monitoring for anemia and iron supplementation. - Monitor for anemia signs. - Order blood work to assess anemia. - Discuss iron supplementation if anemia confirmed.

## 2023-11-19 NOTE — Assessment & Plan Note (Signed)
 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 MRI shows severe arthritis without stenosis or neural impingement. Pain impacts daily activities. Previous injections provided temporary relief. Discussed duloxetine  for pain management and stress relief. - Review MRI results with her. - Discuss potential benefits of breast reduction surgery in alleviating spinal pressure. - Consider spine injections for temporary pain relief. - Encourage weight loss to reduce spinal pressure. - Prescribe duloxetine  30 mg daily for one week, then 60 mg daily.

## 2023-11-19 NOTE — Patient Instructions (Addendum)
-   Obtain fasting labs with orders provided (can have water or black coffee but otherwise no food or drink x 8 hours before labs) - Review information provided - Attend eye doctor annually, dentist every 6 months, work towards or maintain 30 minutes of moderate intensity physical activity at least 5 days per week, and consume a balanced diet - Return in 1 year for physical - Contact us  for any questions between now and then   VISIT SUMMARY:  Today, we discussed your ongoing back pain, heavy menstrual bleeding, fatigue, and stress. We reviewed your MRI results and talked about potential treatments, including medication and surgery.  YOUR PLAN:  CHRONIC LOW BACK PAIN: You have severe left-sided back pain due to lumbar spondylosis with arthritis. -We reviewed your MRI results. -Consider breast reduction surgery to alleviate spinal pressure. -Continue with spine injections for temporary pain relief. -Work on weight loss through diet and exercise to reduce spinal pressure. -Start taking duloxetine  30 mg daily for one week, then increase to 60 mg daily.  OBESITY: Your weight is contributing to your back pain. -Focus on weight loss through a healthy diet and regular exercise. -We discussed how losing weight can help reduce spinal pressure and improve your overall health.  ABNORMAL UTERINE BLEEDING: You have heavy menstrual bleeding due to uterine fibroids. -Monitor for signs of anemia. -We will do blood work to check for anemia. -If anemia is confirmed, we will discuss iron supplements.  SUSPECTED IRON DEFICIENCY ANEMIA: You may have anemia due to heavy menstrual bleeding. -We will do blood work to check your hemoglobin and iron levels. -If your anemia is severe, we may refer you to a hematologist for an iron infusion. -Eat more iron-rich foods in your diet.  STRESS AND INSOMNIA: You are experiencing stress and difficulty sleeping, likely worsened by your chronic pain. -Start taking  duloxetine  30 mg daily for one week, then increase to 60 mg daily. -We discussed the side effects of duloxetine ; take it with food if needed. -We will follow up to see how the medication is working and if you have any side effects.

## 2023-11-20 ENCOUNTER — Ambulatory Visit: Payer: Self-pay | Admitting: Family Medicine

## 2023-11-20 LAB — CBC
Hematocrit: 38.1 % (ref 34.0–46.6)
Hemoglobin: 12.9 g/dL (ref 11.1–15.9)
MCH: 32 pg (ref 26.6–33.0)
MCHC: 33.9 g/dL (ref 31.5–35.7)
MCV: 95 fL (ref 79–97)
Platelets: 341 x10E3/uL (ref 150–450)
RBC: 4.03 x10E6/uL (ref 3.77–5.28)
RDW: 13.2 % (ref 11.7–15.4)
WBC: 7.1 x10E3/uL (ref 3.4–10.8)

## 2023-11-20 LAB — COMPREHENSIVE METABOLIC PANEL WITH GFR
ALT: 13 IU/L (ref 0–32)
AST: 16 IU/L (ref 0–40)
Albumin: 4.7 g/dL (ref 3.9–4.9)
Alkaline Phosphatase: 77 IU/L (ref 41–116)
BUN/Creatinine Ratio: 16 (ref 9–23)
BUN: 12 mg/dL (ref 6–24)
Bilirubin Total: 0.3 mg/dL (ref 0.0–1.2)
CO2: 24 mmol/L (ref 20–29)
Calcium: 9.5 mg/dL (ref 8.7–10.2)
Chloride: 101 mmol/L (ref 96–106)
Creatinine, Ser: 0.77 mg/dL (ref 0.57–1.00)
Globulin, Total: 2 g/dL (ref 1.5–4.5)
Glucose: 89 mg/dL (ref 70–99)
Potassium: 4.3 mmol/L (ref 3.5–5.2)
Sodium: 139 mmol/L (ref 134–144)
Total Protein: 6.7 g/dL (ref 6.0–8.5)
eGFR: 98 mL/min/1.73 (ref >59)

## 2023-11-20 LAB — LIPID PANEL
Chol/HDL Ratio: 3.5 ratio (ref 0.0–4.4)
Cholesterol, Total: 188 mg/dL (ref 100–199)
HDL: 54 mg/dL (ref >39)
LDL Chol Calc (NIH): 112 mg/dL — ABNORMAL HIGH (ref 0–99)
Triglycerides: 123 mg/dL (ref 0–149)
VLDL Cholesterol Cal: 22 mg/dL (ref 5–40)

## 2023-11-20 LAB — HEMOGLOBIN A1C
Est. average glucose Bld gHb Est-mCnc: 123 mg/dL
Hgb A1c MFr Bld: 5.9 % — ABNORMAL HIGH (ref 4.8–5.6)

## 2023-11-20 LAB — IRON,TIBC AND FERRITIN PANEL
Ferritin: 63 ng/mL (ref 15–150)
Iron Saturation: 18 % (ref 15–55)
Iron: 51 ug/dL (ref 27–159)
Total Iron Binding Capacity: 291 ug/dL (ref 250–450)
UIBC: 240 ug/dL (ref 131–425)

## 2023-12-10 DIAGNOSIS — Z79899 Other long term (current) drug therapy: Secondary | ICD-10-CM | POA: Insufficient documentation

## 2023-12-10 DIAGNOSIS — R937 Abnormal findings on diagnostic imaging of other parts of musculoskeletal system: Secondary | ICD-10-CM | POA: Insufficient documentation

## 2023-12-10 DIAGNOSIS — Z789 Other specified health status: Secondary | ICD-10-CM | POA: Insufficient documentation

## 2023-12-10 DIAGNOSIS — M899 Disorder of bone, unspecified: Secondary | ICD-10-CM | POA: Insufficient documentation

## 2023-12-10 NOTE — Patient Instructions (Incomplete)

## 2023-12-10 NOTE — Progress Notes (Unsigned)
 PROVIDER NOTE: Interpretation of information contained herein should be left to medically-trained personnel. Specific patient instructions are provided elsewhere under Patient Instructions section of medical record. This document was created in part using AI and STT-dictation technology, any transcriptional errors that may result from this process are unintentional.  Patient: Dawn Hunter  Service: E/M Encounter  Provider: Eric DELENA Como, MD  DOB: 1980-11-02  Delivery: Face-to-face  Specialty: Interventional Pain Management  MRN: [id number]  Setting: Ambulatory outpatient facility  Specialty designation: 09  Type: New Patient  Location: Outpatient office facility  PCP: Alvia Selinda PARAS, MD  DOS: 12/11/2023    Referring Prov.: Alvia Selinda PARAS, MD   Primary Reason(s) for Visit: Encounter for initial evaluation of one or more chronic problems (new to examiner) potentially causing chronic pain, and posing a threat to normal musculoskeletal function. (Level of risk: High) CC: No chief complaint on file.  HPI  Dawn Hunter is a 43 y.o. year old, female patient, who comes for the first time to our practice referred by Alvia Selinda PARAS, MD for our initial evaluation of her chronic pain. 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 Today she comes in for evaluation of her No chief complaint on file.  Pain Assessment: Location:     Radiating:   Onset:   Duration:    Quality:   Severity:  /10 (subjective, self-reported pain score)  Effect on ADL:   Timing:   Modifying factors:   BP:    HR:    Onset and Duration: {Hx; Onset and Duration:210120511} Cause of pain: {Hx; Cause:210120521} Severity: {Pain Severity:210120502} Timing: {Symptoms; Timing:210120501} Aggravating Factors: {Causes; Aggravating pain factors:210120507} Alleviating Factors: {Causes; Alleviating Factors:210120500} Associated Problems: {Hx; Associated problems:210120515} Quality of Pain: {Hx; Symptom quality or Descriptor:210120531} Previous Examinations or Tests: {Hx; Previous examinations or test:210120529} Previous Treatments: {Hx; Previous Treatment:210120503}  Dawn Hunter is being evaluated for possible interventional pain management therapies for the treatment of her chronic pain.  Discussed the use of AI scribe software for clinical note transcription with the patient, who gave verbal consent to proceed.  History of Present Illness            Dawn Hunter has been informed that this initial visit was an evaluation only.  On the follow up appointment I will go over the results, including ordered tests and available interventional therapies. At that time she will have the opportunity to decide whether to proceed with offered therapies or not. In the event that Dawn Hunter prefers avoiding interventional options, this will conclude our involvement in the case.  Medication management recommendations may be provided upon request.  Patient informed that diagnostic tests may be ordered to assist in identifying underlying causes, narrow the list of differential diagnoses and aid in determining candidacy for (or contraindications to) planned therapeutic interventions.  Historic Controlled Substance Pharmacotherapy Review PMP and historical list of controlled substances: Hydrocodone -Acetamin 5-325 Mg , Tramadol  Hcl 50 Mg Tablet  Most recently prescribed controlled substance(s):  Opioid Analgesic: None MME/day: 0 mg/day  Historical Monitoring: The patient  reports that she does not currently  use drugs. List of prior UDS Testing: No results found for: MDMA, COCAINSCRNUR, PCPSCRNUR, PCPQUANT, CANNABQUANT, THCU, ETH, CBDTHCR, D8THCCBX, D9THCCBX Historical Background Evaluation: [place] PMP: PDMP reviewed during this encounter. Review of the past 50-months conducted.             PMP NARX Score Report:  Narcotic: 080 Sedative: 040 Stimulant: 000 [place] Department of public safety, offender search: Engineer, [employer] Information) Non-contributory Risk Assessment Profile: Aberrant behavior: None observed or detected today Risk factors for fatal opioid overdose: None identified today PMP NARX Overdose Risk Score: 310 Fatal overdose hazard ratio (HR): Calculation deferred Non-fatal overdose hazard ratio (HR): Calculation deferred Risk of opioid abuse or dependence: 0.7-3.0% with doses <= 36 MME/day and 6.1-26% with doses >= 120 MME/day. Substance use disorder (SUD) risk level: See below Personal History of Substance Abuse (SUD-Substance use disorder):  Alcohol:    Illegal Drugs:    Rx Drugs:    ORT Risk Level calculation:    ORT Scoring interpretation table:  Score <3 = Low Risk for SUD  Score between 4-7 = Moderate Risk for SUD  Score >8 = High Risk for Opioid Abuse   PHQ-2 Depression Scale:  Total score:    PHQ-2 Scoring interpretation table: (Score and probability of major depressive disorder)  Score 0 = No depression  Score 1 = 15.4% Probability  Score 2 = 21.1% Probability  Score 3 = 38.4% Probability  Score 4 = 45.5% Probability  Score 5 = 56.4% Probability  Score 6 = 78.6% Probability   PHQ-9 Depression Scale:  Total score:    PHQ-9 Scoring interpretation table:  Score 0-4 = No depression  Score 5-9 = Mild depression  Score 10-14 = Moderate depression  Score 15-19 = Moderately severe depression  Score 20-27 = Severe depression (2.4 times higher  risk of SUD and 2.89 times higher risk of overuse)   Pharmacologic Plan: As per protocol, I have not taken over any controlled substance management, pending the results of ordered tests and/or consults.            Initial impression: Pending review of available data and ordered tests.  Meds   Current Outpatient Medications:    DULoxetine  (CYMBALTA ) 30 MG capsule, Take 1 capsule (30 mg total) by mouth daily for 7 days. Then increase to duloxetine  60 mg., Disp: 7 capsule, Rfl: 0   DULoxetine  (CYMBALTA ) 60 MG capsule, Take 1 capsule (60 mg total) by mouth daily., Disp: 90 capsule, Rfl: 0  Imaging Review  Lumbosacral Imaging: Lumbar MR wo contrast: Results for orders placed during the hospital encounter of 11/05/23 MR Lumbar Spine Wo Contrast  Narrative EXAM: MRI LUMBAR SPINE 11/05/2023 03:41:14 PM  TECHNIQUE: Multiplanar multisequence MRI of the lumbar spine was performed without the administration of intravenous contrast.  COMPARISON: Lumbar spine radiographs 05/04/2021.  CLINICAL HISTORY: Low back pain since 2010, spondyloarthropathy suspected, with throbbing, stiffness, and left buttock pain.  FINDINGS:  BONES AND ALIGNMENT: 5 lumbar vertebrae. Normal alignment. No fracture, significant marrow edema, or suspicious marrow lesion in the lumbar spine. Nonspecific 7 mm STIR hyperintense focus in the left lateral aspect of the S3 segment, only imaged sagittally. Similar size of a subtle lucent focus in this location on a 2022 CT of the abdomen and pelvis favoring a benign/indolent etiology.  SPINAL CORD: The conus terminates at L2 and is normal in signal.  SOFT TISSUES: No paraspinal mass.  DISC LEVELS: T11-T12: Only imaged sagittally. Mild disc desiccation and mild disc space narrowing. Mild disc bulging  without significant stenosis.  T12-L1: Minimal disc bulging and mild facet hypertrophy without stenosis.  L1-L2: Minimal disc bulging and mild to moderate facet  hypertrophy without stenosis.  L2-L3: Moderate facet hypertrophy without disc herniation or stenosis.  L3-L4: Moderate facet and ligamentum flavum hypertrophy without disc herniation or stenosis.  L4-L5: Mild disc bulging and severe facet hypertrophy without stenosis.  L5-S1: Disc desiccation. Mild right and severe left facet hypertrophy without stenosis.  IMPRESSION: 1. Advanced facet arthrosis in the mid and lower lumbar spine. 2. No disc herniation or stenosis.  Electronically signed by: Dasie Hamburg MD 11/08/2023 05:45 PM EDT RP Workstation: [id number]  Lumbar DG (Complete) 4+V: Results for orders placed during the hospital encounter of 05/04/21 DG Lumbar Spine Complete  Narrative CLINICAL DATA:  Chronic low back pain, left side radiculopathy into calf, progressively worsening.  EXAM: LUMBAR SPINE - COMPLETE 4+ VIEW  COMPARISON:  None.  FINDINGS: Five non-rib-bearing lumbar vertebral bodies. Lower lumbar spine facet arthropathy. Osteophyte formation along the thoracic spine. There is no evidence of lumbar spine fracture. Alignment is normal. Intervertebral disc spaces are maintained.  Right upper quadrant surgical clips. Phleboliths noted overlying the pelvis.  IMPRESSION: No acute displaced fracture or traumatic listhesis of the lumbar spine.   Electronically Signed By: Morgane  Naveau M.D. On: 05/05/2021 15:55  Complexity Note: Imaging results reviewed.                         ROS  Cardiovascular: {Hx; Cardiovascular History:210120525} Pulmonary or Respiratory: {Hx; Pumonary and/or Respiratory History:210120523} Neurological: {Hx; Neurological:210120504} Psychological-Psychiatric: {Hx; Psychological-Psychiatric History:210120512} Gastrointestinal: {Hx; Gastrointestinal:210120527} Genitourinary: {Hx; Genitourinary:210120506} Hematological: {Hx; Hematological:210120510} Endocrine: {Hx; Endocrine history:210120509} Rheumatologic: {Hx;  Rheumatological:210120530} Musculoskeletal: {Hx; Musculoskeletal:210120528} Work History: {Hx; Work history:210120514}  Allergies  Dawn Hunter has no known allergies.  Laboratory Chemistry Profile   Renal Lab Results  Component Value Date   BUN 12 11/19/2023   CREATININE 0.77 11/19/2023   BCR 16 11/19/2023   GFRNONAA >60 04/17/2023   SPECGRAV 1.015 01/26/2022   PHUR 6.5 01/26/2022   PROTEINUR NEGATIVE 10/16/2023     Electrolytes Lab Results  Component Value Date   NA 139 11/19/2023   K 4.3 11/19/2023   CL 101 11/19/2023   CALCIUM 9.5 11/19/2023     Hepatic Lab Results  Component Value Date   AST 16 11/19/2023   ALT 13 11/19/2023   ALBUMIN 4.7 11/19/2023   ALKPHOS 77 11/19/2023     ID Lab Results  Component Value Date   HIV Non Reactive 06/01/2021   SARSCOV2NAA NEGATIVE 03/18/2023   PREGTESTUR NEGATIVE 10/16/2023     Bone Lab Results  Component Value Date   VD25OH 16.6 (L) 11/15/2022     Endocrine Lab Results  Component Value Date   GLUCOSE 89 11/19/2023   GLUCOSEU NEGATIVE 10/16/2023   HGBA1C 5.9 (H) 11/19/2023   TSH 1.830 11/15/2022   FREET4 1.06 03/19/2022     Neuropathy Lab Results  Component Value Date   HGBA1C 5.9 (H) 11/19/2023   HIV Non Reactive 06/01/2021     CNS No results found for: COLORCSF, APPEARCSF, RBCCOUNTCSF, WBCCSF, POLYSCSF, LYMPHSCSF, EOSCSF, PROTEINCSF, GLUCCSF, JCVIRUS, CSFOLI, IGGCSF, LABACHR, ACETBL   Inflammation (CRP: Acute  ESR: Chronic) No results found for: CRP, ESRSEDRATE, LATICACIDVEN   Rheumatology No results found for: RF, ANA, LABURIC, URICUR, LYMEIGGIGMAB, LYMEABIGMQN, HLAB27   Coagulation Lab Results  Component Value Date   PLT 341 11/19/2023     Cardiovascular Lab Results  Component Value  Date   HGB 12.9 11/19/2023   HCT 38.1 11/19/2023     Screening Lab Results  Component Value Date   SARSCOV2NAA NEGATIVE 03/18/2023   HIV Non Reactive  06/01/2021   PREGTESTUR NEGATIVE 10/16/2023     Cancer No results found for: CEA, CA125, LABCA2   Allergens No results found for: ALMOND, APPLE, ASPARAGUS, AVOCADO, BANANA, BARLEY, BASIL, BAYLEAF, GREENBEAN, LIMABEAN, WHITEBEAN, BEEFIGE, REDBEET, BLUEBERRY, BROCCOLI, CABBAGE, MELON, CARROT, CASEIN, CASHEWNUT, CAULIFLOWER, CELERY     Note: Lab results reviewed.  PFSH  Drug: Dawn Hunter  reports that she does not currently use drugs. Alcohol:  reports that she does not currently use alcohol. Tobacco:  reports that she has never smoked. She has never used smokeless tobacco. Medical:  has a past medical history of Anxiety (10/30/2013) and Hypertension. Family: family history includes Arthritis in her mother; Breast cancer in her cousin; Cancer in her paternal grandfather and paternal grandmother; Diabetes in her father, maternal grandmother, and paternal grandmother; Hypertension in her father, maternal grandfather, maternal grandmother, mother, paternal grandfather, paternal grandmother, and son.  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 11/19/2023   Class 2 severe obesity due to excess calories with serious comorbidity and body mass index (BMI) of 35.0 to 35.9 in adult 11/19/2023   Pharmacologic therapy 12/10/2023   Disorder of skeletal system 12/10/2023   Problems influencing health status  12/10/2023   Abnormal MRI, lumbar spine (11/08/2023) 12/10/2023   Resolved Ambulatory Problems    Diagnosis Date Noted   Hypertension 05/04/2021   Tinea corporis 06/01/2021   Need for Tdap vaccination 09/01/2021   Pain in axilla 09/01/2021   Axillary tenderness 09/14/2021   Cystitis 01/26/2022   Greater trochanteric pain syndrome of left lower extremity 02/01/2022   Swelling of left eyelid 08/10/2022   Acute vaginitis 11/16/2022   Yeast dermatitis 11/16/2022   Past Medical History:  Diagnosis Date   Anxiety 10/30/2013   Constitutional Exam  General appearance: Well nourished, well developed, and well hydrated. In no apparent acute distress There were no vitals filed for this visit. BMI Assessment: Estimated body mass index is 35.18 kg/m as calculated from the following:   Height as of 11/19/23: 5' 7 (1.702 m).   Weight as of 11/19/23: 224 lb 9.6 oz (101.9 kg).  BMI interpretation table: BMI level Category Range association with higher incidence of chronic pain  <18 kg/m2 Underweight   18.5-24.9 kg/m2 Ideal body weight   25-29.9 kg/m2 Overweight Increased incidence by 20%  30-34.9 kg/m2 Obese (Class I) Increased incidence by 68%  35-39.9 kg/m2 Severe obesity (Class II) Increased incidence by 136%  >40 kg/m2 Extreme obesity (Class III) Increased incidence by 254%   Patient's current BMI Ideal Body weight  There is no height or weight on file to calculate BMI. Patient weight not recorded   BMI Readings from Last 4 Encounters:  11/19/23 35.18 kg/m  11/12/23 36.18 kg/m  10/25/23 35.08 kg/m  10/23/23 35.24 kg/m  Wt Readings from Last 4 Encounters:  11/19/23 224 lb 9.6 oz (101.9 kg)  11/12/23 231 lb (104.8 kg)  10/25/23 224 lb (101.6 kg)  10/23/23 225 lb (102.1 kg)    Psych/Mental status: Alert, oriented x 3 (person, place, & time)       Eyes: PERLA Respiratory: No evidence of acute respiratory distress  Assessment  Primary Diagnosis & Pertinent Problem  List: The primary encounter diagnosis was Chronic pain syndrome. Diagnoses of Pharmacologic therapy, Disorder of skeletal system, Problems influencing health status, and Abnormal MRI, lumbar spine (11/08/2023) were also pertinent to this visit.  Visit Diagnosis (New problems to examiner): 1. Chronic pain syndrome   2. Pharmacologic therapy   3. Disorder of skeletal system   4. Problems influencing health status   5. Abnormal MRI, lumbar spine (11/08/2023)    Plan of Care (Initial workup plan)  Note: Dawn Hunter was reminded that as per protocol, today's visit has been an evaluation only. We have not taken over the patient's controlled substance management.  Problem-specific plan: Assessment and Plan            Lab Orders  No laboratory test(s) ordered today   Imaging Orders  No imaging studies ordered today   Referral Orders  No referral(s) requested today   Procedure Orders    No procedure(s) ordered today   Pharmacotherapy (current): Medications ordered:  No orders of the defined types were placed in this encounter.  Medications administered during this visit: Dawn Daune had no medications administered during this visit.   Analgesic Pharmacotherapy:  Opioid Analgesics: For patients currently taking or requesting to take opioid analgesics, in accordance with [place]  Medical Board Guidelines, we will assess their risks and indications for the use of these substances. After completing our evaluation, we may offer recommendations, but we no longer take patients for medication management. The prescribing physician will ultimately decide, based on his/her training and level of comfort whether to adopt any of the recommendations, including whether or not to prescribe such medicines.  Membrane stabilizer: To be determined at a later time  Muscle relaxant: To be determined at a later time  NSAID: To be determined at a later time  Other analgesic(s): To be determined  at a later time   Interventional management options: Dawn Hunter was informed that there is no guarantee that she would be a candidate for interventional therapies. The decision will be based on the results of diagnostic studies, as well as Dawn Hunter's risk profile.  Procedure(s) under consideration:  Pending results of ordered studies     Interventional Therapies  Risk Factors  Considerations  Medical Comorbidities:     Planned  Pending:      Under consideration:   Pending   Completed: (Analgesic benefit)1  None at this time   Therapeutic  Palliative (PRN) options:   None established   Completed by other providers:   None reported  1(Analgesic benefit): Expressed in percentage (%). (Local anesthetic[LA] +/- sedation  L.A.Local Anesthetic  Steroid benefit  Ongoing benefit)   Provider-requested follow-up: No follow-ups on file.  Future Appointments  Date Time Provider Department Center  12/11/2023  1:00 PM Tanya Glisson, MD [facility] None  01/14/2024  8:45 AM Dillingham, Estefana RAMAN, DO [facility] None  01/17/2024  8:20 AM Alvia Selinda PARAS, MD [facility] [phone]  11/19/2024  8:20 AM Alvia Selinda PARAS, MD [facility] [phone]   I discussed the assessment and treatment plan with the patient. The patient was provided an  opportunity to ask questions and all were answered. The patient agreed with the plan and demonstrated an understanding of the instructions.  Patient advised to call back or seek an in-person evaluation if the symptoms or condition worsens.  Duration of encounter: *** minutes.  Total time on encounter, as per AMA guidelines included both the face-to-face and non-face-to-face time personally spent by the physician and/or other qualified health care professional(s) on the day of the encounter (includes time in activities that require the physician or other qualified health care professional and does not include time in activities normally performed by  clinical staff). Physician's time may include the following activities when performed: Preparing to see the patient (e.g., pre-charting review of records, searching for previously ordered imaging, lab work, and nerve conduction tests) Review of prior analgesic pharmacotherapies. Reviewing PMP Interpreting ordered tests (e.g., lab work, imaging, nerve conduction tests) Performing post-procedure evaluations, including interpretation of diagnostic procedures Obtaining and/or reviewing separately obtained history Performing a medically appropriate examination and/or evaluation Counseling and educating the patient/family/caregiver Ordering medications, tests, or procedures Referring and communicating with other health care professionals (when not separately reported) Documenting clinical information in the electronic or other health record Independently interpreting results (not separately reported) and communicating results to the patient/ family/caregiver Care coordination (not separately reported)  Note by: Eric DELENA Como, MD (TTS and AI technology used. I apologize for any typographical errors that were not detected and corrected.) Date: 12/11/2023; Time: 7:19 PM

## 2023-12-11 ENCOUNTER — Ambulatory Visit: Attending: Pain Medicine | Admitting: Pain Medicine

## 2023-12-11 ENCOUNTER — Encounter: Payer: Self-pay | Admitting: Pain Medicine

## 2023-12-11 VITALS — BP 135/91 | HR 80 | Temp 97.8°F | Ht 67.0 in | Wt 228.0 lb

## 2023-12-11 DIAGNOSIS — G8929 Other chronic pain: Secondary | ICD-10-CM | POA: Insufficient documentation

## 2023-12-11 DIAGNOSIS — Z789 Other specified health status: Secondary | ICD-10-CM | POA: Diagnosis not present

## 2023-12-11 DIAGNOSIS — M899 Disorder of bone, unspecified: Secondary | ICD-10-CM | POA: Insufficient documentation

## 2023-12-11 DIAGNOSIS — M79605 Pain in left leg: Secondary | ICD-10-CM | POA: Diagnosis not present

## 2023-12-11 DIAGNOSIS — Z79899 Other long term (current) drug therapy: Secondary | ICD-10-CM | POA: Insufficient documentation

## 2023-12-11 DIAGNOSIS — R937 Abnormal findings on diagnostic imaging of other parts of musculoskeletal system: Secondary | ICD-10-CM | POA: Diagnosis not present

## 2023-12-11 DIAGNOSIS — M545 Low back pain, unspecified: Secondary | ICD-10-CM | POA: Insufficient documentation

## 2023-12-11 DIAGNOSIS — M79604 Pain in right leg: Secondary | ICD-10-CM | POA: Insufficient documentation

## 2023-12-11 DIAGNOSIS — G894 Chronic pain syndrome: Secondary | ICD-10-CM | POA: Diagnosis not present

## 2023-12-11 DIAGNOSIS — M47816 Spondylosis without myelopathy or radiculopathy, lumbar region: Secondary | ICD-10-CM | POA: Insufficient documentation

## 2023-12-11 DIAGNOSIS — M549 Dorsalgia, unspecified: Secondary | ICD-10-CM | POA: Diagnosis not present

## 2023-12-11 DIAGNOSIS — M5459 Other low back pain: Secondary | ICD-10-CM | POA: Diagnosis not present

## 2023-12-11 NOTE — Progress Notes (Signed)
 Safety precautions to be maintained throughout the outpatient stay will include: orient to surroundings, keep bed in low position, maintain call bell within reach at all times, provide assistance with transfer out of bed and ambulation.

## 2023-12-12 ENCOUNTER — Ambulatory Visit
Admission: RE | Admit: 2023-12-12 | Discharge: 2023-12-12 | Disposition: A | Discharge status: Home / Self Care | Attending: Pain Medicine | Admitting: Pain Medicine

## 2023-12-12 ENCOUNTER — Ambulatory Visit
Admission: RE | Admit: 2023-12-12 | Discharge: 2023-12-12 | Disposition: A | Discharge status: Home / Self Care | Source: Ambulatory Visit | Attending: Pain Medicine | Admitting: Pain Medicine

## 2023-12-12 DIAGNOSIS — M5459 Other low back pain: Secondary | ICD-10-CM | POA: Insufficient documentation

## 2023-12-12 DIAGNOSIS — M47816 Spondylosis without myelopathy or radiculopathy, lumbar region: Secondary | ICD-10-CM | POA: Diagnosis not present

## 2023-12-12 DIAGNOSIS — M545 Low back pain, unspecified: Secondary | ICD-10-CM

## 2023-12-12 DIAGNOSIS — G8929 Other chronic pain: Secondary | ICD-10-CM | POA: Diagnosis not present

## 2023-12-12 DIAGNOSIS — R937 Abnormal findings on diagnostic imaging of other parts of musculoskeletal system: Secondary | ICD-10-CM

## 2023-12-12 DIAGNOSIS — M79605 Pain in left leg: Secondary | ICD-10-CM | POA: Insufficient documentation

## 2023-12-12 DIAGNOSIS — M79604 Pain in right leg: Secondary | ICD-10-CM | POA: Insufficient documentation

## 2023-12-12 DIAGNOSIS — M549 Dorsalgia, unspecified: Secondary | ICD-10-CM | POA: Diagnosis not present

## 2023-12-16 LAB — COMPLIANCE DRUG ANALYSIS, UR

## 2023-12-19 ENCOUNTER — Ambulatory Visit (HOSPITAL_BASED_OUTPATIENT_CLINIC_OR_DEPARTMENT_OTHER): Admitting: Pain Medicine

## 2023-12-19 ENCOUNTER — Ambulatory Visit
Admission: RE | Admit: 2023-12-19 | Discharge: 2023-12-19 | Disposition: A | Discharge status: Home / Self Care | Source: Ambulatory Visit | Attending: Pain Medicine | Admitting: Pain Medicine

## 2023-12-19 ENCOUNTER — Encounter: Payer: Self-pay | Admitting: Pain Medicine

## 2023-12-19 VITALS — BP 130/89 | HR 84 | Temp 97.9°F | Resp 18 | Ht 67.0 in | Wt 228.0 lb

## 2023-12-19 DIAGNOSIS — M549 Dorsalgia, unspecified: Secondary | ICD-10-CM | POA: Insufficient documentation

## 2023-12-19 DIAGNOSIS — G8929 Other chronic pain: Secondary | ICD-10-CM | POA: Insufficient documentation

## 2023-12-19 DIAGNOSIS — M47816 Spondylosis without myelopathy or radiculopathy, lumbar region: Secondary | ICD-10-CM | POA: Insufficient documentation

## 2023-12-19 DIAGNOSIS — M79605 Pain in left leg: Secondary | ICD-10-CM | POA: Insufficient documentation

## 2023-12-19 DIAGNOSIS — M545 Low back pain, unspecified: Secondary | ICD-10-CM | POA: Insufficient documentation

## 2023-12-19 DIAGNOSIS — M5459 Other low back pain: Secondary | ICD-10-CM | POA: Insufficient documentation

## 2023-12-19 DIAGNOSIS — R937 Abnormal findings on diagnostic imaging of other parts of musculoskeletal system: Secondary | ICD-10-CM | POA: Insufficient documentation

## 2023-12-19 DIAGNOSIS — M79604 Pain in right leg: Secondary | ICD-10-CM | POA: Insufficient documentation

## 2023-12-19 MED ORDER — MIDAZOLAM HCL 5 MG/5ML IJ SOLN
INTRAMUSCULAR | Status: AC
  Filled 2023-12-19: qty 5

## 2023-12-19 MED ORDER — ROPIVACAINE HCL 2 MG/ML IJ SOLN
INTRAMUSCULAR | Status: AC
  Filled 2023-12-19: qty 20

## 2023-12-19 MED ORDER — FENTANYL CITRATE (PF) 100 MCG/2ML IJ SOLN
25.0000 ug | INTRAMUSCULAR | Status: DC | PRN
  Administered 2023-12-19: 50 ug via INTRAVENOUS

## 2023-12-19 MED ORDER — LIDOCAINE HCL 2 % IJ SOLN
20.0000 mL | Freq: Once | INTRAMUSCULAR | Status: AC
  Administered 2023-12-19: 400 mg

## 2023-12-19 MED ORDER — TRIAMCINOLONE ACETONIDE 40 MG/ML IJ SUSP
INTRAMUSCULAR | Status: AC
  Filled 2023-12-19: qty 2

## 2023-12-19 MED ORDER — TRIAMCINOLONE ACETONIDE 40 MG/ML IJ SUSP
80.0000 mg | Freq: Once | INTRAMUSCULAR | Status: AC
  Administered 2023-12-19: 80 mg

## 2023-12-19 MED ORDER — MIDAZOLAM HCL 5 MG/5ML IJ SOLN
0.5000 mg | Freq: Once | INTRAMUSCULAR | Status: AC
  Administered 2023-12-19: 2 mg via INTRAVENOUS
  Administered 2023-12-19: 1 mg via INTRAVENOUS

## 2023-12-19 MED ORDER — PENTAFLUOROPROP-TETRAFLUOROETH EX AERO
INHALATION_SPRAY | Freq: Once | CUTANEOUS | Status: AC
  Administered 2023-12-19: 30 via TOPICAL

## 2023-12-19 MED ORDER — LIDOCAINE HCL 2 % IJ SOLN
INTRAMUSCULAR | Status: AC
  Filled 2023-12-19: qty 20

## 2023-12-19 MED ORDER — FENTANYL CITRATE (PF) 100 MCG/2ML IJ SOLN
INTRAMUSCULAR | Status: AC
  Filled 2023-12-19: qty 2

## 2023-12-19 MED ORDER — ROPIVACAINE HCL 2 MG/ML IJ SOLN
18.0000 mL | Freq: Once | INTRAMUSCULAR | Status: AC
  Administered 2023-12-19: 18 mL via PERINEURAL

## 2023-12-19 NOTE — Patient Instructions (Addendum)
 ______________________________________________________________________    Post-Procedure Discharge Instructions  INSTRUCTIONS Apply ice:  Purpose: This will minimize any swelling and discomfort after procedure.  When: Day of procedure, as soon as you get home. How: Fill a plastic sandwich bag with crushed ice. Cover it with a small towel and apply to injection site. How long: (15 min on, 15 min off) Apply for 15 minutes then remove x 15 minutes.  Repeat sequence on day of procedure, until you go to bed. Apply heat:  Purpose: To treat any soreness and discomfort from the procedure. When: Starting the next day after the procedure. How: Apply heat to procedure site starting the day following the procedure. How long: May continue to repeat daily, until discomfort goes away. Food intake: Start with clear liquids (like water ) and advance to regular food, as tolerated.  Physical activities: Keep activities to a minimum for the first 8 hours after the procedure. After that, then as tolerated. Driving: If you have received any sedation, be responsible and do not drive. You are not allowed to drive for 24 hours after having sedation. Blood thinner: (Applies only to those taking blood thinners) You may restart your blood thinner 6 hours after your procedure. Insulin : (Applies only to Diabetic patients taking insulin ) As soon as you can eat, you may resume your normal dosing schedule. Infection prevention: Keep procedure site clean and dry. Shower daily and clean area with soap and water .  PAIN DIARY Post-procedure Pain Diary: Extremely important that this be done correctly and accurately. Recorded information will be used to determine the next step in treatment. For the purpose of accuracy, follow these rules: Evaluate only the area treated. Do not report or include pain from an untreated area. For the purpose of this evaluation, ignore all other areas of pain, except for the treated area. After your  procedure, avoid taking a long nap and attempting to complete the pain diary after you wake up. Instead, set your alarm clock to go off every hour, on the hour, for the initial 8 hours after the procedure. Document the duration of the numbing medicine, and the relief you are getting from it. Do not go to sleep and attempt to complete it later. It will not be accurate. If you received sedation, it is likely that you were given a medication that may cause amnesia. Because of this, completing the diary at a later time may cause the information to be inaccurate. This information is needed to plan your care. Follow-up appointment: Keep your post-procedure follow-up evaluation appointment after the procedure (usually 2 weeks for most procedures, 6 weeks for radiofrequencies). DO NOT FORGET to bring you pain diary with you.   EXPECT... (What should I expect to see with my procedure?) From numbing medicine (AKA: Local Anesthetics): Numbness or decrease in pain. You may also experience some weakness, which if present, could last for the duration of the local anesthetic. Onset: Full effect within 15 minutes of injected. Duration: It will depend on the type of local anesthetic used. On the average, 1 to 8 hours.  From steroids (Applies only if steroids were used): Decrease in swelling or inflammation. Once inflammation is improved, relief of the pain will follow. Onset of benefits: Depends on the amount of swelling present. The more swelling, the longer it will take for the benefits to be seen. In some cases, up to 10 days. Duration: Steroids will stay in the system x 2 weeks. Duration of benefits will depend on multiple posibilities including persistent irritating  factors. Side-effects: If present, they may typically last 2 weeks (the duration of the steroids). Frequent: Cramps (if they occur, drink Gatorade and take over-the-counter Magnesium  450-500 mg once to twice a day); water  retention with temporary weight  gain; increases in blood sugar; decreased immune system response; increased appetite. Occasional: Facial flushing (red, warm cheeks); mood swings; menstrual changes. Uncommon: Long-term decrease or suppression of natural hormones; bone thinning. (These are more common with higher doses or more frequent use. This is why we prefer that our patients avoid having any injection therapies in other practices.)  Very Rare: Severe mood changes; psychosis; aseptic necrosis. From procedure: Some discomfort is to be expected once the numbing medicine wears off. This should be minimal if ice and heat are applied as instructed.  CALL IF... (When should I call?) You experience numbness and weakness that gets worse with time, as opposed to wearing off. New onset bowel or bladder incontinence. (Applies only to procedures done in the spine)  Emergency Numbers: Durning business hours (Monday - Thursday, 8:00 AM - 4:00 PM) (Friday, 9:00 AM - 12:00 Noon): [phone] After hours: [phone] NOTE: If you are having a problem and are unable connect with, or to talk to a provider, then go to your nearest urgent care or emergency department. If the problem is serious and urgent, please call 911. ______________________________________________________________________     ______________________________________________________________________    Steroid injections  Common steroids for injections Triamcinolone : Used by many sports medicine physicians for large joint and bursal injections, often combined with a local anesthetic like lidocaine . A study focusing on coccydynia (tailbone pain) found triamcinolone  was more effective than betamethasone, suggesting it may also be preferable for other localized inflammation conditions. Methylprednisolone : A common alternative to triamcinolone  that is also a strong anti-inflammatory. It is available in different formulations, with the acetate suspension being the long-acting  option for intra-articular injections. Dexamethasone : This is a non-particulate steroid, meaning it has a lower risk of tissue damage compared to particulate steroids like triamcinolone  and methylprednisolone . While less common for this specific use, it is an option for targeted injections.   Considerations for physicians Particulate vs. non-particulate steroids: Triamcinolone  and methylprednisolone  are particulate, meaning they can clump together. Dexamethasone  is non-particulate. Particulate steroids are often preferred for their longer-lasting effects but carry a theoretical higher risk for certain injections (though this is less of a concern in the costochondral joints). Combined injectate: Corticosteroids are typically mixed with a local anesthetic like lidocaine  to provide both immediate pain relief (from the anesthetic) and longer-term inflammation reduction (from the steroid). Imaging guidance: To ensure accurate placement of the needle and medication, physicians may use ultrasound or fluoroscopic guidance for the injection, especially in complex or refractory cases.   Patient guidance Before undergoing a steroid injection, discuss the options with your physician. They will determine the best steroid, dosage, and procedure for your specific case based on factors like: Severity of your condition History of response to other treatments Your overall health status Experience and preference of the physician  Last  Updated: 09/24/2023 ______________________________________________________________________    Moderate Conscious Sedation, Adult, Care After After the procedure, it is common to have: Sleepiness for a few hours. Impaired judgment for a few hours. Trouble with balance. Nausea or vomiting if you eat too soon. Follow these instructions at home: For the time period you were told by your health care provider:  Rest. Do not participate in activities where you could fall or become  injured. Do not drive or  use machinery. Do not drink alcohol. Do not take sleeping pills or medicines that cause drowsiness. Do not make important decisions or sign legal documents. Do not take care of children on your own. Eating and drinking Follow instructions from your health care provider about what you may eat and drink. Drink enough fluid to keep your urine pale yellow. If you vomit: Drink clear fluids slowly and in small amounts as you are able. Clear fluids include water , ice chips, low-calorie sports drinks, and fruit juice that has water  added to it (diluted fruit juice). Eat light and bland foods in small amounts as you are able. These foods include bananas, applesauce, rice, lean meats, toast, and crackers. General instructions Take over-the-counter and prescription medicines only as told by your health care provider. Have a responsible adult stay with you for the time you are told. Do not use any products that contain nicotine or tobacco. These products include cigarettes, chewing tobacco, and vaping devices, such as e-cigarettes. If you need help quitting, ask your health care provider. Return to your normal activities as told by your health care provider. Ask your health care provider what activities are safe for you. Your health care provider may give you more instructions. Make sure you know what you can and cannot do. Contact a health care provider if: You are still sleepy or having trouble with balance after 24 hours. You feel light-headed. You vomit every time you eat or drink. You get a rash. You have a fever. You have redness or swelling around the IV site. Get help right away if: You have trouble breathing. You start to feel confused at home. These symptoms may be an emergency. Get help right away. Call 911. Do not wait to see if the symptoms will go away. Do not drive yourself to the hospital. This information is not intended to replace advice given to you by  your health care provider. Make sure you discuss any questions you have with your health care provider. Document Revised: 07/31/2021 Document Reviewed: 07/31/2021 [employer] Patient Education  2024 [employer].

## 2023-12-19 NOTE — Progress Notes (Signed)
 PROVIDER NOTE: Interpretation of information contained herein should be left to medically-trained personnel. Specific patient instructions are provided elsewhere under Patient Instructions section of medical record. This document was created in part using STT-dictation technology, any transcriptional errors that may result from this process are unintentional.  Patient: Dawn Hunter Type: Established DOB: 1980/09/27 MRN: [id number] PCP: Dawn Selinda PARAS, MD  Service: Procedure DOS: 12/19/2023 Setting: Ambulatory Location: Ambulatory outpatient facility Delivery: Face-to-face Provider: Eric DELENA Como, MD Specialty: Interventional Pain Management Specialty designation: 09 Location: Outpatient facility Ref. Prov.: Como Eric, MD       Interventional Therapy   Type: Lumbar Facet, Medial Branch Block(s) (w/ fluoroscopic mapping) #1  Laterality: Bilateral  Level: L2, L3, L4, L5, and S1 Medial Branch/Dorsal Rami Level(s). Injecting these levels blocks the L3-4, L4-5, and L5-S1 lumbar facet joints. Imaging: Fluoroscopic guidance Spinal ([id number]) Anesthesia: Local anesthesia (1-2% Lidocaine ) Anxiolysis: IV Versed  3.0 mg            Sedation: Minimal Sedation Fentanyl  1 mL (50 mcg) DOS: 12/19/2023 Performed by: Eric DELENA Como, MD  Primary Purpose: Diagnostic/Therapeutic Indications: Low back pain severe enough to impact quality of life or function. 1. Chronic low back pain (Bilateral) w/o sciatica   2. Low back pain of over 3 months duration   3. Lumbar facet joint pain   4. Lumbar facet hypertrophy (Multilevel) (Bilateral)   5. Lumbar facet joint syndrome (Bilateral)   6. Spondylosis without myelopathy or radiculopathy, lumbar region   7. Low back pain radiating to both legs   8. Multifactorial low back pain   9. Chronic mid back pain   10. Abnormal MRI, lumbar spine (11/08/2023)    NAS-11 Pain score:   Pre-procedure: 7 /10   Post-procedure: 0-No pain/10      Position / Prep / Materials:  Position: Prone  Prep solution: ChloraPrep (2% chlorhexidine gluconate and 70% isopropyl alcohol) Area Prepped: Posterolateral Lumbosacral Spine (Wide prep: From the lower border of the scapula down to the end of the tailbone and from flank to flank.)  Materials:  Tray: Block Needle(s):  Type: Spinal  Gauge (G): 22  Length: 3.5-in Qty: 4     H&P (Pre-op Assessment):  Ms. Dawn Hunter is a 43 y.o. (year old), female patient, seen today for interventional treatment. She  has a past surgical history that includes Cesarean section; Tubal ligation; and Cholecystectomy (08/09/2013). Ms. Dawn Hunter has a current medication list which includes the following prescription(s): hydrochlorothiazide , and the following Facility-Administered Medications: fentanyl . Her primarily concern today is the Back Pain (lower)  Initial Vital Signs:  Pulse/HCG Rate: 84ECG Heart Rate: 93 Temp: 97.8 F (36.6 C) Resp: 16 BP: (!) 149/103 SpO2: 100 %  BMI: Estimated body mass index is 35.71 kg/m as calculated from the following:   Height as of this encounter: 5' 7 (1.702 m).   Weight as of this encounter: 228 lb (103.4 kg).  Risk Assessment: Allergies: Reviewed. She has no known allergies.  Allergy Precautions: None required Coagulopathies: Reviewed. None identified.  Blood-thinner therapy: None at this time Active Infection(s): Reviewed. None identified. Ms. Dawn Hunter is afebrile  Site Confirmation: Ms. Dawn Hunter was asked to confirm the procedure and laterality before marking the site Procedure checklist: Completed Consent: Before the procedure and under the influence of no sedative(s), amnesic(s), or anxiolytics, the patient was informed of the treatment options, risks and possible complications. To fulfill our ethical and legal obligations, as recommended by the American Medical Association's Code of Ethics, I have informed the patient of my  clinical impression; the nature and purpose  of the treatment or procedure; the risks, benefits, and possible complications of the intervention; the alternatives, including doing nothing; the risk(s) and benefit(s) of the alternative treatment(s) or procedure(s); and the risk(s) and benefit(s) of doing nothing. The patient was provided information about the general risks and possible complications associated with the procedure. These may include, but are not limited to: failure to achieve desired goals, infection, bleeding, organ or nerve damage, allergic reactions, paralysis, and death. In addition, the patient was informed of those risks and complications associated to Spine-related procedures, such as failure to decrease pain; infection (i.e.: Meningitis, epidural or intraspinal abscess); bleeding (i.e.: epidural hematoma, subarachnoid hemorrhage, or any other type of intraspinal or peri-dural bleeding); organ or nerve damage (i.e.: Any type of peripheral nerve, nerve root, or spinal cord injury) with subsequent damage to sensory, motor, and/or autonomic systems, resulting in permanent pain, numbness, and/or weakness of one or several areas of the body; allergic reactions; (i.e.: anaphylactic reaction); and/or death. Furthermore, the patient was informed of those risks and complications associated with the medications. These include, but are not limited to: allergic reactions (i.e.: anaphylactic or anaphylactoid reaction(s)); adrenal axis suppression; blood sugar elevation that in diabetics may result in ketoacidosis or comma; water retention that in patients with history of congestive heart failure may result in shortness of breath, pulmonary edema, and decompensation with resultant heart failure; weight gain; swelling or edema; medication-induced neural toxicity; particulate matter embolism and blood vessel occlusion with resultant organ, and/or nervous system infarction; and/or aseptic necrosis of one or more joints. Finally, the patient was informed  that Medicine is not an exact science; therefore, there is also the possibility of unforeseen or unpredictable risks and/or possible complications that may result in a catastrophic outcome. The patient indicated having understood very clearly. We have given the patient no guarantees and we have made no promises. Enough time was given to the patient to ask questions, all of which were answered to the patient's satisfaction. Ms. Kissner has indicated that she wanted to continue with the procedure. Attestation: I, the ordering provider, attest that I have discussed with the patient the benefits, risks, side-effects, alternatives, likelihood of achieving goals, and potential problems during recovery for the procedure that I have provided informed consent. Date  Time: 12/19/2023  8:49 AM  Pre-Procedure Preparation:  Monitoring: As per clinic protocol. Respiration, ETCO2, SpO2, BP, heart rate and rhythm monitor placed and checked for adequate function Safety Precautions: Patient was assessed for positional comfort and pressure points before starting the procedure. Time-out: I initiated and conducted the Time-out before starting the procedure, as per protocol. The patient was asked to participate by confirming the accuracy of the Time Out information. Verification of the correct person, site, and procedure were performed and confirmed by me, the nursing staff, and the patient. Time-out conducted as per Joint Commission's Universal Protocol (UP.01.01.01). Time: 0950 Start Time: 0950 hrs.  Description of Procedure:          Laterality: (see above) Targeted Levels: (see above)  Safety Precautions: Aspiration looking for blood return was conducted prior to all injections. At no point did we inject any substances, as a needle was being advanced. Before injecting, the patient was told to immediately notify me if she was experiencing any new onset of ringing in the ears, or metallic taste in the mouth. No  attempts were made at seeking any paresthesias. Safe injection practices and needle disposal techniques used. Medications properly checked for  expiration dates. SDV (single dose vial) medications used. After the completion of the procedure, all disposable equipment used was discarded in the proper designated medical waste containers. Local Anesthesia: Protocol guidelines were followed. The patient was positioned over the fluoroscopy table. The area was prepped in the usual manner. The time-out was completed. The target area was identified using fluoroscopy. A 12-in long, straight, sterile hemostat was used with fluoroscopic guidance to locate the targets for each level blocked. Once located, the skin was marked with an approved surgical skin marker. Once all sites were marked, the skin (epidermis, dermis, and hypodermis), as well as deeper tissues (fat, connective tissue and muscle) were infiltrated with a small amount of a short-acting local anesthetic, loaded on a 10cc syringe with a 25G, 1.5-in  Needle. An appropriate amount of time was allowed for local anesthetics to take effect before proceeding to the next step. Local Anesthetic: Lidocaine 2.0% The unused portion of the local anesthetic was discarded in the proper designated containers. Technical description of process:  Medial Branch  Dorsal Rami Nerve Block (MBB):  Neuroanatomy note: Each lumbar facet joint receives dual innervation from medial branches arising from the posterior primary rami at the same level and one level above. The target for each lumbar medial branch is the junction of the ipsilateral superior articular and transverse process of the lower vertebral body. (i.e.: The L4-L5 facet joint is innervated by the L4 medial branch [located at L5] and the L3 medial branch [located at L4]. Blocking the L4 Medial Branch is therefore achieved by injecting at the junction of the ipsilateral superior articular and transverse process of the lower  vertebral body [L5].).  Exception: The exception to the above rule is the L5-S1 facet joint which has triple innervation requiring the L4 medial branch, as well as the L5 and the S1 Dorsal Rami(s) to be blocked to fully denervate the joint.  Under fluoroscopic guidance, a needle was inserted until contact was made with os over the target area. After negative aspiration, 0.5 mL of the nerve block solution was injected without difficulty or complication. Paresthesia were avoided during injection. The needle(s) were removed intact and without complication.  Once the entire procedure was completed, the treated area was cleaned, making sure to leave some of the prepping solution back to take advantage of its long term bactericidal properties.         Illustration of the posterior view of the lumbar spine and the posterior neural structures. Laminae of L2 through S1 are labeled. DPRL5, dorsal primary ramus of L5; DPRS1, dorsal primary ramus of S1; DPR3, dorsal primary ramus of L3; FJ, facet (zygapophyseal) joint L3-L4; I, inferior articular process of L4; LB1, lateral branch of dorsal primary ramus of L1; IAB, inferior articular branches from L3 medial branch (supplies L4-L5 facet joint); IBP, intermediate branch plexus; MB3, medial branch of dorsal primary ramus of L3; NR3, third lumbar nerve root; S, superior articular process of L5; SAB, superior articular branches from L4 (supplies L4-5 facet joint also); TP3, transverse process of L3.   Facet Joint Innervation (* possible contribution)  L1-2 T12, L1 (L2*)  Medial Branch  L2-3 L1, L2 (L3*)                     L3-4 L2, L3 (L4*)                     L4-5 L3, L4 (L5*)  L5-S1 L4, L5, S1                        Vitals:   12/19/23 1005 12/19/23 1015 12/19/23 1024 12/19/23 1034  BP: (!) 121/97 124/85 (!) 118/90 130/89  Pulse:      Resp: 17 20 16 18   Temp:  98.1 F (36.7 C)  97.9 F (36.6 C)  TempSrc:  Temporal  Temporal   SpO2: 100% 97% 99% 100%  Weight:      Height:         End Time: 1004 hrs.  Imaging Guidance (Spinal):         Type of Imaging Technique: Fluoroscopy Guidance (Spinal) Indication(s): Fluoroscopy guidance for needle placement to enhance accuracy in procedures requiring precise needle localization for targeted delivery of medication in or near specific anatomical locations not easily accessible without such real-time imaging assistance. Exposure Time: Please see nurses notes. Contrast: None used. Fluoroscopic Guidance: I was personally present during the use of fluoroscopy. Tunnel Vision Technique used to obtain the best possible view of the target area. Parallax error corrected before commencing the procedure. Direction-depth-direction technique used to introduce the needle under continuous pulsed fluoroscopy. Once target was reached, antero-posterior, oblique, and lateral fluoroscopic projection used confirm needle placement in all planes. Images permanently stored in EMR. Interpretation: No contrast injected. I personally interpreted the imaging intraoperatively. Adequate needle placement confirmed in multiple planes. Permanent images saved into the patient's record.  Post-operative Assessment:  Post-procedure Vital Signs:  Pulse/HCG Rate: 8480 Temp: 97.9 F (36.6 C) Resp: 18 BP: 130/89 SpO2: 100 %  EBL: None  Complications: No immediate post-treatment complications observed by team, or reported by patient.  Note: The patient tolerated the entire procedure well. A repeat set of vitals were taken after the procedure and the patient was kept under observation following institutional policy, for this type of procedure. Post-procedural neurological assessment was performed, showing return to baseline, prior to discharge. The patient was provided with post-procedure discharge instructions, including a section on how to identify potential problems. Should any problems arise concerning  this procedure, the patient was given instructions to immediately contact us , at any time, without hesitation. In any case, we plan to contact the patient by telephone for a follow-up status report regarding this interventional procedure.  Comments:  No additional relevant information.  Plan of Care (POC)  Orders:  Orders Placed This Encounter  Procedures   LUMBAR FACET(MEDIAL BRANCH NERVE BLOCK) MBNB    Scheduling Instructions:     Procedure: Lumbar facet block (AKA.: Lumbosacral medial branch nerve block)     Side: Bilateral     Level: L3-4, L4-5, L5-S1, and TBD Facets (L2, L3, L4, L5, S1, and TBD Medial Branch Nerves)     Sedation: Patient's choice.     Date: 12/19/2023    Where will this procedure be performed?:   [facility]   [facility] C-ARM 1-60 MIN NO REPORT    Intraoperative interpretation by procedural physician at [facility].    Standing Status:   Standing    Number of Occurrences:   1    Reason for exam::   Assistance in needle guidance and placement for procedures requiring needle placement in or near specific anatomical locations not easily accessible without such assistance.   Informed Consent Details: Physician/Practitioner Attestation; Transcribe to consent form and obtain patient signature    Nursing Order: Transcribe to consent form and obtain patient signature. Note: Always  confirm laterality of pain with Ms. Huether, before procedure.    Physician/Practitioner attestation of informed consent for procedure/surgical case:   I, the physician/practitioner, attest that I have discussed with the patient the benefits, risks, side effects, alternatives, likelihood of achieving goals and potential problems during recovery for the procedure that I have provided informed consent.    Procedure:   Lumbar Facet Block  under fluoroscopic guidance    Physician/Practitioner performing the procedure:   Lumen Brinlee A. Tanya MD    Indication/Reason:   Low Back  Pain, with our without leg pain, due to Facet Joint Arthralgia (Joint Pain) Spondylosis (Arthritis of the Spine), without myelopathy or radiculopathy (Nerve Damage).   Provide equipment / supplies at bedside    Procedure tray: Block Tray (Disposable  single use) Skin infiltration needle: Regular 1.5-in, 25-G, (x1) Block Needle type: Spinal Amount/quantity: 4 Size: Regular (3.5-inch) Gauge: 22G    Standing Status:   Standing    Number of Occurrences:   1    Specify:   Block Tray   Saline lock IV    Have LR [id number] mL available and administer at 125 mL/hr if patient becomes hypotensive.    Standing Status:   Standing    Number of Occurrences:   1     Opioid Analgesic: None MME/day: 0 mg/day    Medications ordered for procedure: Meds ordered this encounter  Medications   lidocaine (XYLOCAINE) 2 % (with pres) injection 400 mg   pentafluoroprop-tetrafluoroeth (GEBAUERS) aerosol   midazolam (VERSED) 5 MG/5ML injection 0.5-2 mg    Make sure Flumazenil is available in the pyxis when using this medication. If oversedation occurs, administer 0.2 mg IV over 15 sec. If after 45 sec no response, administer 0.2 mg again over 1 min; may repeat at 1 min intervals; not to exceed 4 doses (1 mg)   fentaNYL (SUBLIMAZE) injection 25-50 mcg    Make sure Narcan is available in the pyxis when using this medication. In the event of respiratory depression (RR< 8/min): Titrate NARCAN (naloxone) in increments of 0.1 to 0.2 mg IV at 2-3 minute intervals, until desired degree of reversal.   ropivacaine (PF) 2 mg/mL (0.2%) (NAROPIN) injection 18 mL   triamcinolone  acetonide (KENALOG -40) injection 80 mg   Medications administered: We administered lidocaine, pentafluoroprop-tetrafluoroeth, midazolam, fentaNYL, ropivacaine (PF) 2 mg/mL (0.2%), and triamcinolone  acetonide.  See the medical record for exact dosing, route, and time of administration.    Interventional Therapies  Risk Factors  Considerations   Medical Comorbidities:     Planned  Pending:   Diagnostic bilateral lumbar facet MBB #1    Under consideration:   Pending   Completed: (Analgesic benefit)1  None at this time   Therapeutic  Palliative (PRN) options:   None established   Completed by other providers:   None reported  1(Analgesic benefit): Expressed in percentage (%). (Local anesthetic[LA] +/- sedation  L.A.Local Anesthetic  Steroid benefit  Ongoing benefit)    Follow-up plan:   Return in about 2 weeks (around 01/02/2024) for (Face2F), (PPE).     Recent Visits Date Type Provider Dept  12/11/23 Office Visit Tanya Glisson, MD [facility]  Showing recent visits within past 90 days and meeting all other requirements Today's Visits Date Type Provider Dept  12/19/23 Procedure visit Tanya Glisson, MD [facility]  Showing today's visits and meeting all other requirements Future Appointments Date Type Provider Dept  01/06/24 Appointment Tanya Glisson, MD [facility]  Showing future appointments within next  90 days and meeting all other requirements   Disposition: Discharge home  Discharge (Date  Time): 12/19/2023; 1043 hrs.   Primary Care Physician: Dawn Selinda PARAS, MD Location: [facility] Note by: Eric DELENA Como, MD (TTS technology used. I apologize for any typographical errors that were not detected and corrected.) Date: 12/19/2023; Time: 10:44 AM  Disclaimer:  Medicine is not an visual merchandiser. The only guarantee in medicine is that nothing is guaranteed. It is important to note that the decision to proceed with this intervention was based on the information collected from the patient. The Data and conclusions were drawn from the patient's questionnaire, the interview, and the physical examination. Because the information was provided in large part by the patient, it cannot be guaranteed that it has not been purposely or  unconsciously manipulated. Every effort has been made to obtain as much relevant data as possible for this evaluation. It is important to note that the conclusions that lead to this procedure are derived in large part from the available data. Always take into account that the treatment will also be dependent on availability of resources and existing treatment guidelines, considered by other Pain Management Practitioners as being common knowledge and practice, at the time of the intervention. For Medico-Legal purposes, it is also important to point out that variation in procedural techniques and pharmacological choices are the acceptable norm. The indications, contraindications, technique, and results of the above procedure should only be interpreted and judged by a Board-Certified Interventional Pain Specialist with extensive familiarity and expertise in the same exact procedure and technique.

## 2023-12-20 ENCOUNTER — Telehealth: Payer: Self-pay

## 2023-12-20 NOTE — Telephone Encounter (Signed)
 Post procedure follow up.  Patient states she is doing good.

## 2023-12-21 LAB — C-REACTIVE PROTEIN: CRP: 7 mg/L (ref 0–10)

## 2023-12-21 LAB — 25-HYDROXY VITAMIN D LCMS D2+D3
25-Hydroxy, Vitamin D-2: 7.3 ng/mL
25-Hydroxy, Vitamin D-3: 10 ng/mL
25-Hydroxy, Vitamin D: 17 ng/mL — ABNORMAL LOW

## 2023-12-21 LAB — MAGNESIUM: Magnesium: 2 mg/dL (ref 1.6–2.3)

## 2023-12-21 LAB — SEDIMENTATION RATE: Sed Rate: 18 mm/h (ref 0–32)

## 2023-12-21 LAB — VITAMIN B12: Vitamin B-12: 519 pg/mL (ref 232–1245)

## 2024-01-02 ENCOUNTER — Telehealth: Payer: Self-pay | Admitting: Pain Medicine

## 2024-01-02 NOTE — Telephone Encounter (Signed)
 Pt stated that she is having pain from procedure . She said that it feels like muscle spasmus on both sides.

## 2024-01-02 NOTE — Telephone Encounter (Signed)
 Attempted to call patient, message left.

## 2024-01-05 NOTE — Progress Notes (Unsigned)
 PROVIDER NOTE: Interpretation of information contained herein should be left to medically-trained personnel. Specific patient instructions are provided elsewhere under Patient Instructions section of medical record. This document was created in part using AI and STT-dictation technology, any transcriptional errors that may result from this process are unintentional.  Patient: Dawn Hunter  Service: E/M   PCP: Alvia Selinda PARAS, MD  DOB: [birth year]  DOS: 01/06/2024  Provider: Eric DELENA Como, MD  MRN: [id number]  Delivery: Face-to-face  Specialty: Interventional Pain Management  Type: Established Patient  Setting: Ambulatory outpatient facility  Specialty designation: 09  Referring Prov.: Alvia Selinda PARAS, MD  Location: Outpatient office facility       History of present illness (HPI) Dawn Hunter, a 43 y.o. year old female, is here today because of her Chronic bilateral low back pain without sciatica [M54.50, G89.29]. Dawn Hunter primary complain today is No chief complaint on file.  Pertinent problems: Dawn Hunter has Lumbar spondylosis; Chronic pain syndrome; Sacroiliac joint arthropathy (Left); Rotator cuff syndrome (Bilateral); Pelvic pain; Abnormal MRI, lumbar spine (11/08/2023); Low back pain of over 3 months duration; Low back pain radiating to both legs; Multifactorial low back pain; Chronic mid back pain; Chronic low back pain (Bilateral) w/o sciatica; Chronic lower extremity pain (Bilateral); Lumbar facet hypertrophy (Multilevel) (Bilateral); Lumbar facet joint pain; Lumbar facet joint syndrome (Bilateral); and Spondylosis without myelopathy or radiculopathy, lumbar region on their pertinent problem list.  Pain Assessment: Severity of Chronic pain is reported as a 5 /10. Location: Back Left, Right, Lower/denies. Onset: More than a month ago. Quality: Dull, Throbbing, Sharp, Stabbing, Spasm, Discomfort, Constant. Timing: Constant. Modifying factor(s): nothing currently. Vitals:   height is 5' 7 (1.702 m) and weight is 219 lb (99.3 kg). Her temporal temperature is 97.5 F (36.4 C) (abnormal). Her blood pressure is 138/84 and her pulse is 96. Her respiration is 16 and oxygen saturation is 98%.  BMI: Estimated body mass index is 34.3 kg/m as calculated from the following:   Height as of this encounter: 5' 7 (1.702 m).   Weight as of this encounter: 219 lb (99.3 kg).  Last encounter: 12/11/2023. Last procedure: 12/19/2023.  Reason for encounter: post-procedure evaluation and assessment.   Discussed the use of AI scribe software for clinical note transcription with the patient, who gave verbal consent to proceed.  History of Present Illness   Dawn Hunter is a 43 year old female who presents for follow-up after a diagnostic bilateral lumbar facet block.  She returns after her first diagnostic bilateral lumbar facet block on December 19, 2023. She had complete pain relief while the local anesthetic was active, with pain returning about 24 hours later, and she notes a persistent 20% improvement compared to baseline.  Over the past four days she has had constant lower back pain that is sharp and associated with uncontrollable muscle spasms. Pain is localized to her back with no radiation to her legs.      Post-Procedure Evaluation   Type: Lumbar Facet, Medial Branch Block(s) (w/ fluoroscopic mapping) #1  Laterality: Bilateral  Level: L2, L3, L4, L5, and S1 Medial Branch/Dorsal Rami Level(s). Injecting these levels blocks the L3-4, L4-5, and L5-S1 lumbar facet joints. Imaging: Fluoroscopic guidance Spinal ([id number]) Anesthesia: Local anesthesia (1-2% Lidocaine ) Anxiolysis: IV Versed  3.0 mg            Sedation: Minimal Sedation Fentanyl  1 mL (50 mcg) DOS: 12/19/2023 Performed by: Eric DELENA Como, MD  Primary Purpose: Diagnostic/Therapeutic Indications: Low back pain severe enough  to impact quality of life or function. 1. Chronic low back pain (Bilateral)  w/o sciatica   2. Low back pain of over 3 months duration   3. Lumbar facet joint pain   4. Lumbar facet hypertrophy (Multilevel) (Bilateral)   5. Lumbar facet joint syndrome (Bilateral)   6. Spondylosis without myelopathy or radiculopathy, lumbar region   7. Low back pain radiating to both legs   8. Multifactorial low back pain   9. Chronic mid back pain   10. Abnormal MRI, lumbar spine (11/08/2023)    NAS-11 Pain score:   Pre-procedure: 7 /10   Post-procedure: 0-No pain/10     Effectiveness:  Initial hour after procedure: 100 %. Subsequent 4-6 hours post-procedure: 100 %. Analgesia past initial 6 hours: 20 % (patient experienced some pain relief x 24 hours, after that the pain returned back to pre procedure severity.). Ongoing improvement:  Analgesic: The patient indicates having attained 100% relief of the pain for approximately 24 hours after which the pain relief went down to about an ongoing 20% improvement and by now the pain is back especially on the left side.  Today she refers not having much pain on the right side. Function: Somewhat improved ROM: Somewhat improved Interpretation: Based on the above results, we have confirmed the facet joints to be involved in the etiology of the patient's low back pain.  She is currently not experiencing any type of lower extremity pain, numbness, or weakness and her pain is specifically localized in her lower back with today's pain being worse on the left side.  Pharmacotherapy Assessment   Opioid Analgesic: None MME/day: 0 mg/day   Monitoring: [place] PMP: PDMP reviewed during this encounter.       Pharmacotherapy: No side-effects or adverse reactions reported. Compliance: No problems identified. Effectiveness: Clinically acceptable.  Dawn Chrissie MATSU, RN  01/06/2024  2:33 PM  Sign when Signing Visit Safety precautions to be maintained throughout the outpatient stay will include: orient to surroundings, keep bed in low position,  maintain call bell within reach at all times, provide assistance with transfer out of bed and ambulation.     UDS:  Summary  Date Value Ref Range Status  12/11/2023 FINAL  Final    Comment:    ==================================================================== Compliance Drug Analysis, Ur ==================================================================== Test                             Result       Flag       Units    NO DRUGS DETECTED. ==================================================================== Test                      Result    Flag   Units      Ref Range   Creatinine              224              mg/dL      >=79 ==================================================================== Declared Medications:  Medication list was not provided. ==================================================================== For clinical consultation, please call [phone]. ====================================================================     No results found for: CBDTHCR No results found for: D8THCCBX No results found for: D9THCCBX  ROS  Constitutional: Denies any fever or chills Gastrointestinal: No reported hemesis, hematochezia, vomiting, or acute GI distress Musculoskeletal: Denies any acute onset joint swelling, redness, loss of ROM, or weakness Neurological: No reported episodes of acute onset apraxia, aphasia, dysarthria, agnosia, amnesia,  paralysis, loss of coordination, or loss of consciousness  Medication Review  hydrochlorothiazide   History Review  Allergy: Dawn Hunter has no known allergies. Drug: Dawn Hunter  reports that she does not currently use drugs. Alcohol:  reports that she does not currently use alcohol. Tobacco:  reports that she has never smoked. She has never used smokeless tobacco. Social: Dawn Hunter  reports that she has never smoked. She has never used smokeless tobacco. She reports that she does not currently use alcohol. She reports  that she does not currently use drugs. Medical:  has a past medical history of Anxiety (10/30/2013) and Hypertension. Surgical: Dawn Hunter  has a past surgical history that includes Cesarean section; Tubal ligation; and Cholecystectomy (08/09/2013). Family: family history includes Arthritis in her mother; Breast cancer in her cousin; Cancer in her paternal grandfather and paternal grandmother; Diabetes in her father, maternal grandmother, and paternal grandmother; Hypertension in her father, maternal grandfather, maternal grandmother, mother, paternal grandfather, paternal grandmother, and son.  Laboratory Chemistry Profile   Renal Lab Results  Component Value Date   BUN 12 11/19/2023   CREATININE 0.77 11/19/2023   BCR 16 11/19/2023   GFRNONAA >60 04/17/2023    Hepatic Lab Results  Component Value Date   AST 16 11/19/2023   ALT 13 11/19/2023   ALBUMIN 4.7 11/19/2023   ALKPHOS 77 11/19/2023    Electrolytes Lab Results  Component Value Date   NA 139 11/19/2023   K 4.3 11/19/2023   CL 101 11/19/2023   CALCIUM 9.5 11/19/2023   MG 2.0 12/11/2023    Bone Lab Results  Component Value Date   VD25OH 16.6 (L) 11/15/2022   25OHVITD1 17 (L) 12/11/2023   25OHVITD2 7.3 12/11/2023   25OHVITD3 10.0 12/11/2023    Inflammation (CRP: Acute Phase) (ESR: Chronic Phase) Lab Results  Component Value Date   CRP 7 12/11/2023   ESRSEDRATE 18 12/11/2023         Note: Above Lab results reviewed.  Recent Imaging Review  DG PAIN [facility] C-ARM 1-60 MIN NO REPORT Fluoro was used, but no Radiologist interpretation will be provided.  Please refer to NOTES tab for provider progress note. Note: Reviewed        Physical Exam  Vitals: BP 138/84 (BP Location: Right Arm, Patient Position: Sitting, Cuff Size: Large)   Pulse 96   Temp (!) 97.5 F (36.4 C) (Temporal)   Resp 16   Ht 5' 7 (1.702 m)   Wt 219 lb (99.3 kg)   SpO2 98%   BMI 34.30 kg/m  BMI: Estimated body mass index is 34.3 kg/m  as calculated from the following:   Height as of this encounter: 5' 7 (1.702 m).   Weight as of this encounter: 219 lb (99.3 kg). Ideal: Ideal body weight: 61.6 kg (135 lb 12.9 oz) Adjusted ideal body weight: 76.7 kg (169 lb 1.3 oz) General appearance: Well nourished, well developed, and well hydrated. In no apparent acute distress Mental status: Alert, oriented x 3 (person, place, & time)       Respiratory: No evidence of acute respiratory distress Eyes: PERLA   Assessment   Diagnosis Status  1. Chronic low back pain (Bilateral) w/o sciatica   2. Low back pain of over 3 months duration   3. Lumbar facet joint pain   4. Postop check   5. Class 2 severe obesity due to excess calories with serious comorbidity and body mass index (BMI) of 35.0 to 35.9 in adult  Recurring Recurring Persistent   Updated Problems: No problems updated.  Plan of Care  Problem-specific:  Assessment and Plan    Lumbar facet joint osteoarthritis   Chronic lumbar facet joint osteoarthritis causes persistent lower back pain. A recent bilateral lumbar facet block provided 100% relief during the local anesthetic duration, confirming the pain's origin. After the anesthetic wore off, pain returned with a 20% improvement. Current pain is constant with muscle spasms, likely due to an electrolyte imbalance from the steroid injection. Radiofrequency ablation is discussed as a potential treatment for longer-lasting relief, pending insurance approval and a successful second diagnostic injection. Weight reduction is crucial for effective radiofrequency ablation due to technical challenges with a higher BMI. A second diagnostic lumbar facet block is scheduled for both sides to confirm reproducibility of results. She is advised to supplement electrolytes with multivitamins, Gatorade, tonic water, and magnesium  to address potential muscle spasms. Education on weight management is provided to facilitate effective  radiofrequency ablation.  Obesity   Her BMI is above 45, increasing the technical difficulty of performing radiofrequency ablation due to the greater distance between the target and skin. Weight loss is essential to improve treatment efficacy and reduce osteoarthritis symptoms. She is referred to a weight loss program for structured support in reducing BMI and advised on lifestyle modifications, including avoiding heavy lifting and twisting at the waist.       Dawn Hunter is not on any long-term medications.  Pharmacotherapy (Medications Ordered): No orders of the defined types were placed in this encounter.  Orders:  Orders Placed This Encounter  Procedures   LUMBAR FACET(MEDIAL BRANCH NERVE BLOCK) MBNB    Diagnosis: Lumbar Facet Syndrome (M47.816); Lumbosacral Facet Syndrome (M47.817); Lumbar Facet Joint Pain (M54.59) Medical Necessity Statement: 1.Severe chronic axial low back pain causing functional impairment documented by ongoing pain scale assessments. 2.Pain present for longer than 3 months (Chronic) documented to have failed noninvasive conservative therapies. 3.Absence of untreated radiculopathy. 4.There is no radiological evidence of untreated fractures, tumor, infection, or deformity.  Physical Examination Findings: Positive Kemp Maneuver: (Y)  Positive Lumbar Hyperextension-Rotation provocative test: (Y)    Standing Status:   Future    Expiration Date:   04/05/2024    Scheduling Instructions:     Procedure: Lumbar facet Block     Type: Medial Branch Block     Side: Bilateral     Purpose: Diagnostic/Therapeutic     Level(s): L3-4, L4-5, and L5-S1 Facets (L2, L3, L4, L5, and S1 Medial Branch)     Sedation: With Sedation.     Timeframe: As soon as schedule allows.    Where will this procedure be performed?:   [facility]   Amb Ref to Medical Weight Management    Referral Priority:   Routine    Referral Type:   Consultation    Referral Reason:    Specialty Services Required    Number of Visits Requested:   1   Amb Referral to Bariatric Surgery    Referral Priority:   Routine    Referral Type:   Consultation    Referral Reason:   Specialty Services Required    Number of Visits Requested:   1   Amb Ref to Medical Weight Management    Referral Priority:   Routine    Referral Type:   Consultation    Referral Reason:   Specialty Services Required    Number of Visits Requested:   1   Nursing Instructions:    Please  complete this patient's postprocedure evaluation.    Scheduling Instructions:     Please complete this patient's postprocedure evaluation.     Interventional Therapies  Risk Factors  Considerations  Medical Comorbidities:     Planned  Pending:   Diagnostic bilateral lumbar facet MBB #2    Under consideration:      Completed: (Analgesic benefit)1  Diagnostic bilateral lumbar facet MBB x1 (12/19/2023) (100/100/20/20)    Therapeutic  Palliative (PRN) options:   None established   Completed by other providers:   None reported  1(Analgesic benefit): Expressed in percentage (%). (Local anesthetic[LA] +/- sedation  L.A.Local Anesthetic  Steroid benefit  Ongoing benefit)   Return for (ECT):(B) L-FCT Blk #2.    Recent Visits Date Type Provider Dept  12/19/23 Procedure visit Tanya Glisson, MD [facility]  12/11/23 Office Visit Tanya Glisson, MD [facility]  Showing recent visits within past 90 days and meeting all other requirements Today's Visits Date Type Provider Dept  01/06/24 Office Visit Tanya Glisson, MD [facility]  Showing today's visits and meeting all other requirements Future Appointments No visits were found meeting these conditions. Showing future appointments within next 90 days and meeting all other requirements  I discussed the assessment and treatment plan with the patient. The patient was provided an opportunity to ask questions and all were  answered. The patient agreed with the plan and demonstrated an understanding of the instructions.  Patient advised to call back or seek an in-person evaluation if the symptoms or condition worsens.  Duration of encounter: 30 minutes.  Total time on encounter, as per AMA guidelines included both the face-to-face and non-face-to-face time personally spent by the physician and/or other qualified health care professional(s) on the day of the encounter (includes time in activities that require the physician or other qualified health care professional and does not include time in activities normally performed by clinical staff). Physician's time may include the following activities when performed: Preparing to see the patient (e.g., pre-charting review of records, searching for previously ordered imaging, lab work, and nerve conduction tests) Review of prior analgesic pharmacotherapies. Reviewing PMP Interpreting ordered tests (e.g., lab work, imaging, nerve conduction tests) Performing post-procedure evaluations, including interpretation of diagnostic procedures Obtaining and/or reviewing separately obtained history Performing a medically appropriate examination and/or evaluation Counseling and educating the patient/family/caregiver Ordering medications, tests, or procedures Referring and communicating with other health care professionals (when not separately reported) Documenting clinical information in the electronic or other health record Independently interpreting results (not separately reported) and communicating results to the patient/ family/caregiver Care coordination (not separately reported)  Note by: Glisson DELENA Tanya, MD (TTS and AI technology used. I apologize for any typographical errors that were not detected and corrected.) Date: 01/06/2024; Time: 4:01 PM

## 2024-01-06 ENCOUNTER — Ambulatory Visit: Attending: Pain Medicine | Admitting: Pain Medicine

## 2024-01-06 ENCOUNTER — Encounter: Payer: Self-pay | Admitting: Pain Medicine

## 2024-01-06 VITALS — BP 138/84 | HR 96 | Temp 97.5°F | Resp 16 | Ht 67.0 in | Wt 219.0 lb

## 2024-01-06 DIAGNOSIS — E66812 Obesity, class 2: Secondary | ICD-10-CM

## 2024-01-06 DIAGNOSIS — G8929 Other chronic pain: Secondary | ICD-10-CM

## 2024-01-06 DIAGNOSIS — M545 Low back pain, unspecified: Secondary | ICD-10-CM

## 2024-01-06 DIAGNOSIS — M5459 Other low back pain: Secondary | ICD-10-CM

## 2024-01-06 DIAGNOSIS — Z6835 Body mass index (BMI) 35.0-35.9, adult: Secondary | ICD-10-CM | POA: Diagnosis not present

## 2024-01-06 DIAGNOSIS — Z09 Encounter for follow-up examination after completed treatment for conditions other than malignant neoplasm: Secondary | ICD-10-CM

## 2024-01-06 NOTE — Patient Instructions (Addendum)
 ______________________________________________________________________    General Risks and Possible Complications  Patient Responsibilities: It is important that you read this as it is part of your informed consent. It is our duty to inform you of the risks and possible complications associated with treatments offered to you. It is your responsibility as a patient to read this and to ask questions about anything that is not clear or that you believe was not covered in this document.  Patient's Rights: You have the right to refuse treatment. You also have the right to change your mind, even after initially having agreed to have the treatment done. However, under this last option, if you wait until the last second to change your mind, you may be charged for the materials used up to that point.  Introduction: Medicine is not an visual merchandiser. Everything in Medicine, including the lack of treatment(s), carries the potential for danger, harm, or loss (which is by definition: Risk). In Medicine, a complication is a secondary problem, condition, or disease that can aggravate an already existing one. All treatments carry the risk of possible complications. The fact that a side effects or complications occurs, does not imply that the treatment was conducted incorrectly. It must be clearly understood that these can happen even when everything is done following the highest safety standards.  No treatment: You can choose not to proceed with the proposed treatment alternative. The "PRO(s)" would include: avoiding the risk of complications associated with the therapy. The "CON(s)" would include: not getting any of the treatment benefits. These benefits fall under one of three categories: diagnostic; therapeutic; and/or palliative. Diagnostic benefits include: getting information which can ultimately lead to improvement of the disease or symptom(s). Therapeutic benefits are those associated with the successful  treatment of the disease. Finally, palliative benefits are those related to the decrease of the primary symptoms, without necessarily curing the condition (example: decreasing the pain from a flare-up of a chronic condition, such as incurable terminal cancer).  General Risks and Complications: These are associated to most interventional treatments. They can occur alone, or in combination. They fall under one of the following six (6) categories: no benefit or worsening of symptoms; bleeding; infection; nerve damage; allergic reactions; and/or death. No benefits or worsening of symptoms: In Medicine there are no guarantees, only probabilities. No healthcare provider can ever guarantee that a medical treatment will work, they can only state the probability that it may. Furthermore, there is always the possibility that the condition may worsen, either directly, or indirectly, as a consequence of the treatment. Bleeding: This is more common if the patient is taking a blood thinner, either prescription or over the counter (example: Goody Powders, Fish oil, Aspirin , Garlic, etc.), or if suffering a condition associated with impaired coagulation (example: Hemophilia, cirrhosis of the liver, low platelet counts, etc.). However, even if you do not have one on these, it can still happen. If you have any of these conditions, or take one of these drugs, make sure to notify your treating physician. Infection: This is more common in patients with a compromised immune system, either due to disease (example: diabetes, cancer, human immunodeficiency virus [HIV], etc.), or due to medications or treatments (example: therapies used to treat cancer and rheumatological diseases). However, even if you do not have one on these, it can still happen. If you have any of these conditions, or take one of these drugs, make sure to notify your treating physician. Nerve Damage: This is more common when the treatment is  an invasive one, but it  can also happen with the use of medications, such as those used in the treatment of cancer. The damage can occur to small secondary nerves, or to large primary ones, such as those in the spinal cord and brain. This damage may be temporary or permanent and it may lead to impairments that can range from temporary numbness to permanent paralysis and/or brain death. Allergic Reactions: Any time a substance or material comes in contact with our body, there is the possibility of an allergic reaction. These can range from a mild skin rash (contact dermatitis) to a severe systemic reaction (anaphylactic reaction), which can result in death. Death: In general, any medical intervention can result in death, most of the time due to an unforeseen complication. ______________________________________________________________________      ______________________________________________________________________    Preparing for your procedure  Appointments: If you think you may not be able to keep your appointment, call 24-48 hours in advance to cancel. We need time to make it available to others.  Procedure visits are for procedures only. During your procedure appointment there will be: NO Prescription Refills*. NO medication changes or discussions*. NO discussion of disability issues*. NO unrelated pain problem evaluations*. NO evaluations to order other pain procedures*. *These will be addressed at a separate and distinct evaluation encounter on the provider's evaluation schedule and not during procedure days.  Instructions: Food intake: Avoid eating anything solid for at least 8 hours prior to your procedure. Clear liquid intake: You may take clear liquids such as water up to 2 hours prior to your procedure. (No carbonated drinks. No soda.) Transportation: Unless otherwise stated by your physician, bring a driver. (Driver cannot be a Market Researcher, Pharmacist, Community, or any other form of public transportation.) Morning  Medicines: Except for blood thinners, take all of your other morning medications with a sip of water. Make sure to take your heart and blood pressure medicines. If your blood pressure's lower number is above 100, the case will be rescheduled. Blood thinners: Make sure to stop your blood thinners as instructed.  If you take a blood thinner, but were not instructed to stop it, call our office [phone] and ask to talk to a nurse. Not stopping a blood thinner prior to certain procedures could lead to serious complications. Diabetics on insulin: Notify the staff so that you can be scheduled 1st case in the morning. If your diabetes requires high dose insulin, take only  of your normal insulin dose the morning of the procedure and notify the staff that you have done so. Preventing infections: Shower with an antibacterial soap the morning of your procedure.  Build-up your immune system: Take 1000 mg of Vitamin C with every meal (3 times a day) the day prior to your procedure. Antibiotics: Inform the nursing staff if you are taking any antibiotics or if you have any conditions that may require antibiotics prior to procedures. (Example: recent joint implants)   Pregnancy: If you are pregnant make sure to notify the nursing staff. Not doing so may result in injury to the fetus, including death.  Sickness: If you have a cold, fever, or any active infections, call and cancel or reschedule your procedure. Receiving steroids while having an infection may result in complications. Arrival: You must be in the facility at least 30 minutes prior to your scheduled procedure. Tardiness: Your scheduled time is also the cutoff time. If you do not arrive at least 15 minutes prior to your procedure, you will  be rescheduled.  Children: Do not bring any children with you. Make arrangements to keep them home. Dress appropriately: There is always a possibility that your clothing may get soiled. Avoid long dresses. Valuables:  Do not bring any jewelry or valuables.  Reasons to call and reschedule or cancel your procedure: (Following these recommendations will minimize the risk of a serious complication.) Surgeries: Avoid having procedures within 2 weeks of any surgery. (Avoid for 2 weeks before or after any surgery). Flu Shots: Avoid having procedures within 2 weeks of a flu shots or . (Avoid for 2 weeks before or after immunizations). Barium: Avoid having a procedure within 7-10 days after having had a radiological study involving the use of radiological contrast. (Myelograms, Barium swallow or enema study). Heart attacks: Avoid any elective procedures or surgeries for the initial 6 months after a Myocardial Infarction (Heart Attack). Blood thinners: It is imperative that you stop these medications before procedures. Let us  know if you if you take any blood thinner.  Infection: Avoid procedures during or within two weeks of an infection (including chest colds or gastrointestinal problems). Symptoms associated with infections include: Localized redness, fever, chills, night sweats or profuse sweating, burning sensation when voiding, cough, congestion, stuffiness, runny nose, sore throat, diarrhea, nausea, vomiting, cold or Flu symptoms, recent or current infections. It is specially important if the infection is over the area that we intend to treat. Heart and lung problems: Symptoms that may suggest an active cardiopulmonary problem include: cough, chest pain, breathing difficulties or shortness of breath, dizziness, ankle swelling, uncontrolled high or unusually low blood pressure, and/or palpitations. If you are experiencing any of these symptoms, cancel your procedure and contact your primary care physician for an evaluation.  Remember:  Regular Business hours are:  Monday to Thursday 8:00 AM to 4:00 PM  Provider's Schedule: Eric Como, MD:  Procedure days: Tuesday and Thursday 7:30 AM to 4:00 PM  Wallie Sherry, MD:  Procedure days: Monday and Wednesday 7:30 AM to 4:00 PM Last  Updated: 01/08/2023 ______________________________________________________________________    Facet Blocks Patient Information  Description: The facets are joints in the spine between the vertebrae.  Like any joints in the body, facets can become irritated and painful.  Arthritis can also effect the facets.  By injecting steroids and local anesthetic in and around these joints, we can temporarily block the nerve supply to them.  Steroids act directly on irritated nerves and tissues to reduce selling and inflammation which often leads to decreased pain.  Facet blocks may be done anywhere along the spine from the neck to the low back depending upon the location of your pain.   After numbing the skin with local anesthetic (like Novocaine), a small needle is passed onto the facet joints under x-ray guidance.  You may experience a sensation of pressure while this is being done.  The entire block usually lasts about 15-25 minutes.   Conditions which may be treated by facet blocks:  Low back/buttock pain Neck/shoulder pain Certain types of headaches  Preparation for the injection:  Do not eat any solid food or dairy products within 8 hours of your appointment. You may drink clear liquid up to 3 hours before appointment.  Clear liquids include water, black coffee, juice or soda.  No milk or cream please. You may take your regular medication, including pain medications, with a sip of water before your appointment.  Diabetics should hold regular insulin (if taken separately) and take 1/2 normal NPH dose the  morning of the procedure.  Carry some sugar containing items with you to your appointment. A driver must accompany you and be prepared to drive you home after your procedure. Bring all your current medications with you. An IV may be inserted and sedation may be given at the discretion of the physician. A blood pressure cuff,  EKG and other monitors will often be applied during the procedure.  Some patients may need to have extra oxygen administered for a short period. You will be asked to provide medical information, including your allergies and medications, prior to the procedure.  We must know immediately if you are taking blood thinners (like Coumadin/Warfarin) or if you are allergic to IV iodine contrast (dye).  We must know if you could possible be pregnant.  Possible side-effects:  Bleeding from needle site Infection (rare, may require surgery) Nerve injury (rare) Numbness & tingling (temporary) Difficulty urinating (rare, temporary) Spinal headache (a headache worse with upright posture) Light-headedness (temporary) Pain at injection site (serveral days) Decreased blood pressure (rare, temporary) Weakness in arm/leg (temporary) Pressure sensation in back/neck (temporary)   Call if you experience:  Fever/chills associated with headache or increased back/neck pain Headache worsened by an upright position New onset, weakness or numbness of an extremity below the injection site Hives or difficulty breathing (go to the emergency room) Inflammation or drainage at the injection site(s) Severe back/neck pain greater than usual New symptoms which are concerning to you  Please note:  Although the local anesthetic injected can often make your back or neck feel good for several hours after the injection, the pain will likely return. It takes 3-7 days for steroids to work.  You may not notice any pain relief for at least one week.  If effective, we will often do a series of 2-3 injections spaced 3-6 weeks apart to maximally decrease your pain.  After the initial series, you may be a candidate for a more permanent nerve block of the facets.  If you have any questions, please call #[phone]) [phone] [facility]   ______________________________________________________________________    Muscle Spasms & Cramps  Cause(s):  Most common - vitamin and/or electrolyte (calcium, potassium, sodium, etc.) deficiencies. Post procedure - steroids (injected, oral, or inhaled) can make your kidneys excrete (loose) electrolytes. Most of the time this will not cause any symptoms however, if you happen to be borderline low on your electrolytes, it may temporarily triggering cramps & spasms.  Possible triggers: Sweating - causes loss of electrolytes thru the skin. Steroids - causes loss of electrolytes thru the urine.  Treatment: (over-the-counter)  Gatorade (or any other electrolyte-replenishing drink) - Take 1, 8 oz glass with each meal (3 times a day). Mechanism of action: Replenishes lost electrolytes. Magnesium  400 to 500 mg - Take 1 tablet twice a day (one with breakfast and one at bedtime). If you have kidney disease talk to your primary care physician before taking any Magnesium . Mechanism of action: Magnesium  is a natural muscle relaxant. Tonic Water with quinine - Take 1, 8 oz glass before bedtime.  Mechanism of action: Quinine is used to treat spasms.  Last Update: 08/09/2022  ______________________________________________________________________     ______________________________________________________________________    Patient information on: Body mass index (BMI) and Weight Management  Dear Ms. Sedano you are receiving this information because your weight may be adversely affecting your health.   Your current Estimated body mass index is 35.87 kg/m as calculated from the following:   Height as  of this encounter: 5' 7 (1.702 m).   Weight as of this encounter: 229 lb (103.9 kg).  We recommend you talk to your primary care physician about providing or referring you to a supervised weight management program.  Here is some information about weight and the body mass index (BMI)  classification:  BMI is a measure of obesity that's calculated by dividing a person's weight in kilograms by their height in meters squared. A person can use an online calculator to determine their BMI. Body mass index (BMI) is a common tool for deciding whether a person has an appropriate body weight.  It measures a person's weight in relation to their height.  According to the [facility] of health (NIH): A BMI of less than 18.5 means that a person is underweight. A BMI of between 18.5 and 24.9 is ideal. A BMI of between 25 and 29.9 is overweight. A BMI over 30 indicates obesity.  Body Mass Index (BMI) Classification BMI level (kg/m2) Category Associated incidence of chronic pain  <18  Underweight   18.5-24.9 Ideal body weight   25-29.9 Overweight  20%  30-34.9 Obese (Class I)  68%  35-39.9 Severe obesity (Class II)  136%  >40 Extreme obesity (Class III)  254%    Morbidly Obese Classification: You will be considered to be Morbidly Obese if your BMI is above 30 and you have one or more of the following conditions caused or associated to obesity: 1.    Type 2 Diabetes (Leading to cardiovascular diseases (CVD), stroke, peripheral vascular diseases (PVD), retinopathy, nephropathy, and neuropathy) 2.    Cardiovascular Disease (High Blood Pressure; Congestive Heart Failure; High Cholesterol; Coronary Artery Disease; Angina; Arrhythmias, Dysrhythmias, or Heart Attacks) 3.    Breathing problems (Asthma; obesity-hypoventilation syndrome; obstructive sleep apnea; chronic inflammatory airway disease; reactive airway disease; or shortness of breath) 4.    Chronic kidney disease 5.    Liver disease (nonalcoholic fatty liver disease) 6.    High blood pressure 7.    Acid reflux (gastroesophageal reflux disease; heartburn) 8.    Osteoarthritis (OA) (affecting the hip(s), the knee(s) and/or the lower back) (usually requiring knee and/or hip replacements, as well as back surgeries) 9.    Low  back pain (Lumbar Facet Syndrome; and/or Degenerative Disc Disease) 10.  Hip pain (Osteoarthritis of hip) (For every 1 lbs of added body weight, there is a 2 lbs increase in pressure inside of each hip articulation. 1:2 mechanical relationship) 11.  Knee pain (Osteoarthritis of knee) (For every 1 lbs of added body weight, there is a 4 lbs increase in pressure inside of each knee articulation. 1:4 mechanical relationship) (patients with a BMI>30 kg/m2 were 6.8 times more likely to develop knee OA than normal-weight individuals) 12.  Cancer: Epidemiological studies have shown that obesity is a risk factor for: post-menopausal breast cancer; cancers of the endometrium, colon and kidney cancer; malignant adenomas of the esophagus. Obese subjects have an approximately 1.5-3.5-fold increased risk of developing these cancers compared with normal-weight subjects, and it has been estimated that between 15 and 45% of these cancers can be attributed to overweight. More recent studies suggest that obesity may also increase the risk of other types of cancer, including pancreatic, hepatic and gallbladder cancer. (Ref: Obesity and cancer. Pischon T, Nthlings U, [employer] H. Proc Nutr Soc. 2008 May;67(2):128-45. doi: 10.1017/S0029665108006976.) The International Agency for Research on Cancer (IARC) has identified 13 cancers associated with overweight and obesity: meningioma, multiple myeloma, adenocarcinoma of the esophagus, and cancers  of the thyroid , postmenopausal breast cancer, gallbladder, stomach, liver, pancreas, kidney, ovaries, uterus, colon and rectal (colorectal) cancers. 55 percent of all cancers diagnosed in women and 24 percent of those diagnosed in men are associated with overweight and obesity.  Recommendation: If you have any of the above conditions it is urgent that you take a step back and concentrate in losing weight. Dedicate 100% of your efforts on this task. Nothing else will improve your health more than  bringing your weight down and your BMI to less than 30.   Nutritionist and/or supervised weight-management program: We are aware that most chronic pain patients are unable to exercise secondary to their pain. For this reason, you must rely on proper nutrition and diet in order to lose the weight. We recommend you talk to a nutritionist.   Bariatric surgery: A person might be considered a candidate for bariatric surgery if they meet one of the following BMI criteria:  BMI of 40 or higher: This is considered extreme obesity (Class III). BMI of 35-39.9: This is considered obesity, and the person might also have a serious weight-related health condition, such as high blood pressure, type 2 diabetes, or severe sleep apnea  BMI of 30-34.9: This might be considered if the person has serious weight-related health problems and hasn't had substantial weight loss or improvement in co-morbidities through other methods   On your own: A realistic goal is to lose 10% of your body weight over a period of 12 months.  If over a period of six (6) months you have unsuccessfully tried to lose weight, then it is time for you to seek professional help and to enter a medically supervised weight management program, and/or undergo bariatric surgery.   Pain management considerations and possible limitations:  1.    Pharmacological Problems: Be advised that the use of opioid analgesics (oxycodone; hydrocodone ; morphine; methadone; codeine; and all of their derivatives) have been associated with decreased metabolism and weight gain.  For this reason, should we see that you are unable to lose weight while taking these medications, it may become necessary for us  to taper down and indefinitely discontinue them.  2.    Technical Problems: The incidence of successful interventional therapies decreases as the patient's BMI increases. It is much more difficult to accomplish a safe and effective interventional therapy on a patient with a  BMI above 35. 3.    Radiation Exposure Problems: The x-rays machine, used to accomplish injection therapies, will automatically increase their x-ray output in order to capture an appropriate bone image. This means that radiation exposure increases exponentially with the patient's BMI. (The higher the BMI, the higher the radiation exposure.) Although the level of radiation used at a given time is still safe to the patient, it is not for the physician and/or assisting staff. Unfortunately, radiation exposure is accumulative. Because physicians and the staff have to do procedures and be exposed on a daily basis, this can result in health problems such as cancer and radiation burns. Radiation exposure to the staff is monitored by the radiation batches that they wear. The exposure levels are reported back to the staff on a quarterly basis. Depending on levels of exposure, physicians and staff may be obligated by law to decrease this exposure. This means that they have the right and obligation to refuse providing therapies where they may be overexposed to radiation. For this reason, physicians may decline to offer therapies such as radiofrequency ablation or implants to patients with a BMI  above 40. 4.    Current Trends: Be advised that the current trend is to no longer offer certain therapies to patients with a BMI equal to, or above 35, due to increase perioperative risks, increased technical procedural difficulties, and excessive radiation exposure to healthcare personnel.  Last updated: 10/22/2022 ______________________________________________________________________

## 2024-01-06 NOTE — Progress Notes (Signed)
 Safety precautions to be maintained throughout the outpatient stay will include: orient to surroundings, keep bed in low position, maintain call bell within reach at all times, provide assistance with transfer out of bed and ambulation.

## 2024-01-07 ENCOUNTER — Telehealth: Payer: Self-pay

## 2024-01-07 NOTE — Telephone Encounter (Signed)
 I called patient to see if she's available for surgery w/ Dr. Fredirick on 01/27/24 at 1 pm at [facility]. I left a detailed message requesting a call back.

## 2024-01-14 ENCOUNTER — Ambulatory Visit: Admitting: Plastic Surgery

## 2024-01-14 ENCOUNTER — Encounter: Payer: Self-pay | Admitting: Plastic Surgery

## 2024-01-14 VITALS — BP 150/93 | HR 84 | Ht 67.0 in | Wt 221.0 lb

## 2024-01-14 DIAGNOSIS — M542 Cervicalgia: Secondary | ICD-10-CM | POA: Diagnosis not present

## 2024-01-14 DIAGNOSIS — M545 Low back pain, unspecified: Secondary | ICD-10-CM

## 2024-01-14 DIAGNOSIS — M546 Pain in thoracic spine: Secondary | ICD-10-CM | POA: Diagnosis not present

## 2024-01-14 DIAGNOSIS — N62 Hypertrophy of breast: Secondary | ICD-10-CM

## 2024-01-14 DIAGNOSIS — Z6834 Body mass index (BMI) 34.0-34.9, adult: Secondary | ICD-10-CM | POA: Diagnosis not present

## 2024-01-14 DIAGNOSIS — G8929 Other chronic pain: Secondary | ICD-10-CM

## 2024-01-14 DIAGNOSIS — M79604 Pain in right leg: Secondary | ICD-10-CM | POA: Diagnosis not present

## 2024-01-14 DIAGNOSIS — M5459 Other low back pain: Secondary | ICD-10-CM

## 2024-01-14 DIAGNOSIS — M79605 Pain in left leg: Secondary | ICD-10-CM | POA: Diagnosis not present

## 2024-01-14 NOTE — Progress Notes (Signed)
 Patient ID: Dawn Hunter, female    DOB: [birth year], 43 y.o.   MRN: [id number]   Chief Complaint  Patient presents with   Consult    Mammary Hyperplasia: The patient is a 43 y.o. female with a history of mammary hyperplasia for several years.  She has extremely large breasts causing symptoms that include the following: Back pain in the upper and lower back, including neck pain. She pulls or pins her bra straps to provide better lift and relief of the pressure and pain. She notices relief by holding her breast up manually.  Her shoulder straps cause grooves and pain and pressure that requires padding for relief. Pain medication is sometimes required with motrin and tylenol .  Activities that are hindered by enlarged breasts include: exercise and running.  She has tried supportive clothing as well as fitted bras without improvement.  She also has a lot of excess axillary soft tissue.  We would likely need to do that at a separate time.  Her breasts are extremely large and fairly symmetric.  She has hyperpigmentation of the inframammary area on both sides.  The sternal to nipple distance on the right is 39 cm and the left is 40 cm.  The IMF distance is 19 cm.  She is 5 feet 7 inches tall and weighs 221 pounds.  The BMI = 34.6 kg/m.  Preoperative bra size = 44 H cup.  Patient states she would be happy to have anything smaller.  The estimated excess breast tissue to be removed at the time of surgery = 750-800 grams on the left and 750-800 grams on the right.  Mammogram history: 2023 and negative.  Family history of breast cancer: No.  Tobacco use: No.   The patient expresses the desire to pursue surgical intervention.  Past surgical history is positive for cholecystectomy and a C-section.     Review of Systems  Constitutional:  Positive for activity change. Negative for appetite change.  HENT: Negative.    Eyes: Negative.   Respiratory: Negative.    Cardiovascular: Negative.    Gastrointestinal: Negative.   Endocrine: Negative.   Genitourinary: Negative.   Musculoskeletal:  Positive for back pain and neck pain.  Skin:  Positive for rash.    Past Medical History:  Diagnosis Date   Anxiety 10/30/2013   Hypertension     Past Surgical History:  Procedure Laterality Date   CESAREAN SECTION     CHOLECYSTECTOMY  08/09/2013   TUBAL LIGATION       Current Medications[1]   Objective:   Vitals:   01/14/24 0849  BP: (!) 150/93  Pulse: 84  SpO2: 99%    Physical Exam Vitals reviewed.  Constitutional:      Appearance: Normal appearance.  HENT:     Head: Atraumatic.  Cardiovascular:     Rate and Rhythm: Normal rate.     Pulses: Normal pulses.  Pulmonary:     Effort: Pulmonary effort is normal.  Abdominal:     Palpations: Abdomen is soft.  Skin:    General: Skin is warm.     Capillary Refill: Capillary refill takes less than 2 seconds.  Neurological:     Mental Status: She is alert and oriented to person, place, and time.  Psychiatric:        Mood and Affect: Mood normal.        Behavior: Behavior normal.        Thought Content: Thought content normal.  Judgment: Judgment normal.     Assessment & Plan:  Symptomatic mammary hypertrophy  Chronic mid back pain  Chronic low back pain (Bilateral) w/o sciatica  Lumbar facet joint pain  Chronic lower extremity pain (Bilateral)  Multifactorial low back pain  The procedure the patient selected and that was best for the patient was discussed. The risk were discussed and include but not limited to the following:  Breast asymmetry, fluid accumulation, firmness of the breast, inability to breast feed, loss of nipple or areola, skin loss, change in skin and nipple sensation, fat necrosis of the breast tissue, bleeding, infection and healing delay.  There are risks of anesthesia and injury to nerves or blood vessels.  Allergic reaction to tape, suture and skin glue are possible.  There will be  swelling.  Any of these can lead to the need for revisional surgery which is not included in this surgery.  A breast reduction has potential to interfere with diagnostic procedures in the future.  This procedure is best done when the breast is fully developed.  Changes in the breast will continue to occur over time: pregnancy, weight gain or weigh loss. No guarantees are given for a certain bra or breast size.    Total time: 40 minutes. This includes time spent with the patient during the visit as well as time spent before and after the visit reviewing the chart, documenting the encounter, ordering pertinent studies and literature for the patient.   Physical therapy: Not required Mammogram: Ordered  The patient is a good candidate for bilateral breast reduction with liposuction.  Pictures were obtained of the patient and placed in the chart with the patient's or guardian's permission.   Dawn RAMAN Brinlyn Cena, DO    [1]  Current Outpatient Medications:    hydrochlorothiazide  (HYDRODIURIL ) 12.5 MG tablet, Take 12.5 mg by mouth daily., Disp: , Rfl:

## 2024-01-15 ENCOUNTER — Telehealth: Payer: Self-pay

## 2024-01-15 NOTE — Telephone Encounter (Signed)
 I called patient to see if she's available for surgery with Dr. Fredirick on 02/18/24. Patient agreed to surgery details and pre-op instructions were provided by phone.

## 2024-01-16 ENCOUNTER — Ambulatory Visit
Admission: RE | Admit: 2024-01-16 | Discharge: 2024-01-16 | Disposition: A | Source: Ambulatory Visit | Attending: Pain Medicine | Admitting: Pain Medicine

## 2024-01-16 ENCOUNTER — Encounter: Payer: Self-pay | Admitting: Pain Medicine

## 2024-01-16 ENCOUNTER — Ambulatory Visit: Admitting: Pain Medicine

## 2024-01-16 VITALS — BP 135/91 | HR 78 | Temp 98.1°F | Resp 16 | Ht 67.0 in | Wt 222.0 lb

## 2024-01-16 DIAGNOSIS — M47816 Spondylosis without myelopathy or radiculopathy, lumbar region: Secondary | ICD-10-CM

## 2024-01-16 DIAGNOSIS — M79604 Pain in right leg: Secondary | ICD-10-CM | POA: Insufficient documentation

## 2024-01-16 DIAGNOSIS — M79605 Pain in left leg: Secondary | ICD-10-CM

## 2024-01-16 DIAGNOSIS — M5459 Other low back pain: Secondary | ICD-10-CM | POA: Diagnosis present

## 2024-01-16 DIAGNOSIS — G8929 Other chronic pain: Secondary | ICD-10-CM

## 2024-01-16 DIAGNOSIS — M545 Low back pain, unspecified: Secondary | ICD-10-CM | POA: Diagnosis not present

## 2024-01-16 MED ORDER — ROPIVACAINE HCL 2 MG/ML IJ SOLN
18.0000 mL | Freq: Once | INTRAMUSCULAR | Status: AC
  Administered 2024-01-16: 13:00:00 20 mL via PERINEURAL

## 2024-01-16 MED ORDER — FENTANYL CITRATE (PF) 100 MCG/2ML IJ SOLN
INTRAMUSCULAR | Status: AC
  Filled 2024-01-16: qty 2

## 2024-01-16 MED ORDER — FENTANYL CITRATE (PF) 100 MCG/2ML IJ SOLN
25.0000 ug | INTRAMUSCULAR | Status: DC | PRN
  Administered 2024-01-16: 13:00:00 50 ug via INTRAVENOUS

## 2024-01-16 MED ORDER — TRIAMCINOLONE ACETONIDE 40 MG/ML IJ SUSP
INTRAMUSCULAR | Status: AC
  Filled 2024-01-16: qty 2

## 2024-01-16 MED ORDER — MIDAZOLAM HCL 5 MG/5ML IJ SOLN
INTRAMUSCULAR | Status: AC
  Filled 2024-01-16: qty 5

## 2024-01-16 MED ORDER — PENTAFLUOROPROP-TETRAFLUOROETH EX AERO
INHALATION_SPRAY | Freq: Once | CUTANEOUS | Status: AC
  Administered 2024-01-16: 13:00:00 30 via TOPICAL

## 2024-01-16 MED ORDER — LIDOCAINE HCL 2 % IJ SOLN
20.0000 mL | Freq: Once | INTRAMUSCULAR | Status: AC
  Administered 2024-01-16: 13:00:00 400 mg

## 2024-01-16 MED ORDER — TRIAMCINOLONE ACETONIDE 40 MG/ML IJ SUSP
80.0000 mg | Freq: Once | INTRAMUSCULAR | Status: AC
  Administered 2024-01-16: 13:00:00 40 mg

## 2024-01-16 MED ORDER — LIDOCAINE HCL 2 % IJ SOLN
INTRAMUSCULAR | Status: AC
  Filled 2024-01-16: qty 20

## 2024-01-16 MED ORDER — ROPIVACAINE HCL 2 MG/ML IJ SOLN
INTRAMUSCULAR | Status: AC
  Filled 2024-01-16: qty 20

## 2024-01-16 MED ORDER — MIDAZOLAM HCL 5 MG/5ML IJ SOLN
0.5000 mg | Freq: Once | INTRAMUSCULAR | Status: AC
  Administered 2024-01-16: 13:00:00 2 mg via INTRAVENOUS

## 2024-01-16 NOTE — Progress Notes (Signed)
 PROVIDER NOTE: Interpretation of information contained herein should be left to medically-trained personnel. Specific patient instructions are provided elsewhere under Patient Instructions section of medical record. This document was created in part using STT-dictation technology, any transcriptional errors that may result from this process are unintentional.  Patient: Dawn Hunter Type: Established DOB: 12/18/1980 MRN: [id number] PCP: Alvia Selinda PARAS, MD  Service: Procedure DOS: 01/16/2024 Setting: Ambulatory Location: Ambulatory outpatient facility Delivery: Face-to-face Provider: Eric DELENA Como, MD Specialty: Interventional Pain Management Specialty designation: 09 Location: Outpatient facility Ref. Prov.: Como Eric, MD       Interventional Therapy   Type: Lumbar Facet, Medial Branch Block(s)   #2  Laterality: Bilateral  Level: L2, L3, L4, L5, and S1 Medial Branch/Dorsal Rami Level(s). Injecting these levels blocks the L3-4, L4-5, and L5-S1 lumbar facet joints. Imaging: Fluoroscopic guidance Spinal ([id number]) Anesthesia: Local anesthesia (1-2% Lidocaine ) Anxiolysis: IV Versed  2.0 mg            Sedation: Minimal Sedation Fentanyl  1 mL (50 mcg) DOS: 01/16/2024 Performed by: Eric DELENA Como, MD  Primary Purpose: Diagnostic/Therapeutic Indications: Low back pain severe enough to impact quality of life or function. 1. Chronic low back pain (Bilateral) w/o sciatica   2. Low back pain of over 3 months duration   3. Lumbar facet joint pain   4. Lumbar facet hypertrophy (Multilevel) (Bilateral)   5. Lumbar facet joint syndrome (Bilateral)   6. Low back pain radiating to both legs   7. Spondylosis without myelopathy or radiculopathy, lumbar region    NAS-11 Pain score:   Pre-procedure: 9 /10   Post-procedure: 0-No pain/10     Position / Prep / Materials:  Position: Prone  Prep solution: ChloraPrep (2% chlorhexidine gluconate and 70% isopropyl alcohol) Area  Prepped: Posterolateral Lumbosacral Spine (Wide prep: From the lower border of the scapula down to the end of the tailbone and from flank to flank.)  Materials:  Tray: Block Needle(s):  Type: Spinal  Gauge (G): 22  Length: 5-in Qty: 4     H&P (Pre-op Assessment):  Dawn Hunter is a 43 y.o. (year old), female patient, seen today for interventional treatment. She  has a past surgical history that includes Cesarean section; Tubal ligation; and Cholecystectomy (08/09/2013). Dawn Hunter has a current medication list which includes the following prescription(s): hydrochlorothiazide , and the following Facility-Administered Medications: fentanyl . Her primarily concern today is the Back Pain  Initial Vital Signs:  Pulse/HCG Rate: 78ECG Heart Rate: 89 Temp: (!) 97.2 F (36.2 C) Resp: 18 BP: (!) 136/102 SpO2: 99 %  BMI: Estimated body mass index is 34.77 kg/m as calculated from the following:   Height as of this encounter: 5' 7 (1.702 m).   Weight as of this encounter: 222 lb (100.7 kg).  Risk Assessment: Allergies: Reviewed. She has no known allergies.  Allergy Precautions: None required Coagulopathies: Reviewed. None identified.  Blood-thinner therapy: None at this time Active Infection(s): Reviewed. None identified. Dawn Hunter is afebrile  Site Confirmation: Dawn Hunter was asked to confirm the procedure and laterality before marking the site Procedure checklist: Completed Consent: Before the procedure and under the influence of no sedative(s), amnesic(s), or anxiolytics, the patient was informed of the treatment options, risks and possible complications. To fulfill our ethical and legal obligations, as recommended by the American Medical Association's Code of Ethics, I have informed the patient of my clinical impression; the nature and purpose of the treatment or procedure; the risks, benefits, and possible complications of the intervention; the alternatives, including  doing nothing; the  risk(s) and benefit(s) of the alternative treatment(s) or procedure(s); and the risk(s) and benefit(s) of doing nothing. The patient was provided information about the general risks and possible complications associated with the procedure. These may include, but are not limited to: failure to achieve desired goals, infection, bleeding, organ or nerve damage, allergic reactions, paralysis, and death. In addition, the patient was informed of those risks and complications associated to Spine-related procedures, such as failure to decrease pain; infection (i.e.: Meningitis, epidural or intraspinal abscess); bleeding (i.e.: epidural hematoma, subarachnoid hemorrhage, or any other type of intraspinal or peri-dural bleeding); organ or nerve damage (i.e.: Any type of peripheral nerve, nerve root, or spinal cord injury) with subsequent damage to sensory, motor, and/or autonomic systems, resulting in permanent pain, numbness, and/or weakness of one or several areas of the body; allergic reactions; (i.e.: anaphylactic reaction); and/or death. Furthermore, the patient was informed of those risks and complications associated with the medications. These include, but are not limited to: allergic reactions (i.e.: anaphylactic or anaphylactoid reaction(s)); adrenal axis suppression; blood sugar elevation that in diabetics may result in ketoacidosis or comma; water retention that in patients with history of congestive heart failure may result in shortness of breath, pulmonary edema, and decompensation with resultant heart failure; weight gain; swelling or edema; medication-induced neural toxicity; particulate matter embolism and blood vessel occlusion with resultant organ, and/or nervous system infarction; and/or aseptic necrosis of one or more joints. Finally, the patient was informed that Medicine is not an exact science; therefore, there is also the possibility of unforeseen or unpredictable risks and/or possible complications  that may result in a catastrophic outcome. The patient indicated having understood very clearly. We have given the patient no guarantees and we have made no promises. Enough time was given to the patient to ask questions, all of which were answered to the patient's satisfaction. Ms. Guthridge has indicated that she wanted to continue with the procedure. Attestation: I, the ordering provider, attest that I have discussed with the patient the benefits, risks, side-effects, alternatives, likelihood of achieving goals, and potential problems during recovery for the procedure that I have provided informed consent. Date  Time: 01/16/2024 10:38 AM  Pre-Procedure Preparation:  Monitoring: As per clinic protocol. Respiration, ETCO2, SpO2, BP, heart rate and rhythm monitor placed and checked for adequate function Safety Precautions: Patient was assessed for positional comfort and pressure points before starting the procedure. Time-out: I initiated and conducted the Time-out before starting the procedure, as per protocol. The patient was asked to participate by confirming the accuracy of the Time Out information. Verification of the correct person, site, and procedure were performed and confirmed by me, the nursing staff, and the patient. Time-out conducted as per Joint Commission's Universal Protocol (UP.01.01.01). Time: 1247 Start Time: 1247 hrs.  Description of Procedure:          Laterality: (see above) Targeted Levels: (see above)  Safety Precautions: Aspiration looking for blood return was conducted prior to all injections. At no point did we inject any substances, as a needle was being advanced. Before injecting, the patient was told to immediately notify me if she was experiencing any new onset of ringing in the ears, or metallic taste in the mouth. No attempts were made at seeking any paresthesias. Safe injection practices and needle disposal techniques used. Medications properly checked for  expiration dates. SDV (single dose vial) medications used. After the completion of the procedure, all disposable equipment used was discarded in the proper designated  medical waste containers. Local Anesthesia: Protocol guidelines were followed. The patient was positioned over the fluoroscopy table. The area was prepped in the usual manner. The time-out was completed. The target area was identified using fluoroscopy. A 12-in long, straight, sterile hemostat was used with fluoroscopic guidance to locate the targets for each level blocked. Once located, the skin was marked with an approved surgical skin marker. Once all sites were marked, the skin (epidermis, dermis, and hypodermis), as well as deeper tissues (fat, connective tissue and muscle) were infiltrated with a small amount of a short-acting local anesthetic, loaded on a 10cc syringe with a 25G, 1.5-in  Needle. An appropriate amount of time was allowed for local anesthetics to take effect before proceeding to the next step. Local Anesthetic: Lidocaine  2.0% The unused portion of the local anesthetic was discarded in the proper designated containers. Technical description of process:  Medial Branch  Dorsal Rami Nerve Block (MBB):  Neuroanatomy note: Each lumbar facet joint receives dual innervation from medial branches arising from the posterior primary rami at the same level and one level above. The target for each lumbar medial branch is the junction of the ipsilateral superior articular and transverse process of the lower vertebral body. (i.e.: The L4-L5 facet joint is innervated by the L4 medial branch [located at L5] and the L3 medial branch [located at L4]. Blocking the L4 Medial Branch is therefore achieved by injecting at the junction of the ipsilateral superior articular and transverse process of the lower vertebral body [L5].).  Exception: The exception to the above rule is the L5-S1 facet joint which has triple innervation requiring the L4  medial branch, as well as the L5 and the S1 Dorsal Rami(s) to be blocked to fully denervate the joint.  Under fluoroscopic guidance, a needle was inserted until contact was made with os over the target area. After negative aspiration, 0.5 mL of the nerve block solution was injected without difficulty or complication. Paresthesia were avoided during injection. The needle(s) were removed intact and without complication.  Once the entire procedure was completed, the treated area was cleaned, making sure to leave some of the prepping solution back to take advantage of its long term bactericidal properties.         Illustration of the posterior view of the lumbar spine and the posterior neural structures. Laminae of L2 through S1 are labeled. DPRL5, dorsal primary ramus of L5; DPRS1, dorsal primary ramus of S1; DPR3, dorsal primary ramus of L3; FJ, facet (zygapophyseal) joint L3-L4; I, inferior articular process of L4; LB1, lateral branch of dorsal primary ramus of L1; IAB, inferior articular branches from L3 medial branch (supplies L4-L5 facet joint); IBP, intermediate branch plexus; MB3, medial branch of dorsal primary ramus of L3; NR3, third lumbar nerve root; S, superior articular process of L5; SAB, superior articular branches from L4 (supplies L4-5 facet joint also); TP3, transverse process of L3.   Facet Joint Innervation (* possible contribution)  L1-2 T12, L1 (L2*)  Medial Branch  L2-3 L1, L2 (L3*)                     L3-4 L2, L3 (L4*)                     L4-5 L3, L4 (L5*)                     L5-S1 L4, L5, S1  Vitals:   01/16/24 1305 01/16/24 1315 01/16/24 1325 01/16/24 1335  BP: 126/80 (!) 132/96 128/89 (!) 135/91  Pulse:      Resp: 16 20 16 16   Temp: 98.2 F (36.8 C)   98.1 F (36.7 C)  SpO2: 96% 97% 100% 99%  Weight:      Height:         End Time: 1257 hrs.  Imaging Guidance (Spinal):         Type of Imaging Technique: Fluoroscopy Guidance  (Spinal) Indication(s): Fluoroscopy guidance for needle placement to enhance accuracy in procedures requiring precise needle localization for targeted delivery of medication in or near specific anatomical locations not easily accessible without such real-time imaging assistance. Exposure Time: Please see nurses notes. Contrast: None used. Fluoroscopic Guidance: I was personally present during the use of fluoroscopy. Tunnel Vision Technique used to obtain the best possible view of the target area. Parallax error corrected before commencing the procedure. Direction-depth-direction technique used to introduce the needle under continuous pulsed fluoroscopy. Once target was reached, antero-posterior, oblique, and lateral fluoroscopic projection used confirm needle placement in all planes. Images permanently stored in EMR. Interpretation: No contrast injected. I personally interpreted the imaging intraoperatively. Adequate needle placement confirmed in multiple planes. Permanent images saved into the patient's record.  Post-operative Assessment:  Post-procedure Vital Signs:  Pulse/HCG Rate: 7881 Temp: 98.1 F (36.7 C) Resp: 16 BP: (!) 135/91 (Did not take BP medication this morning. Will take when she gets home. Patient aware of BP numbers.) SpO2: 99 %  EBL: None  Complications: No immediate post-treatment complications observed by team, or reported by patient.  Note: The patient tolerated the entire procedure well. A repeat set of vitals were taken after the procedure and the patient was kept under observation following institutional policy, for this type of procedure. Post-procedural neurological assessment was performed, showing return to baseline, prior to discharge. The patient was provided with post-procedure discharge instructions, including a section on how to identify potential problems. Should any problems arise concerning this procedure, the patient was given instructions to immediately  contact us , at any time, without hesitation. In any case, we plan to contact the patient by telephone for a follow-up status report regarding this interventional procedure.  Comments:  No additional relevant information.  Plan of Care (POC)  Orders:  Orders Placed This Encounter  Procedures   LUMBAR FACET(MEDIAL BRANCH NERVE BLOCK) MBNB    Scheduling Instructions:     Procedure: Lumbar facet block (AKA.: Lumbosacral medial branch nerve block)     Side: Bilateral     Level: L3-4, L4-5, and L5-S1 Facets (L2, L3, L4, L5, and S1 Medial Branch Nerves)     Sedation: Patient's choice.     Date: 01/16/2024    Where will this procedure be performed?:   [facility]   DG PAIN [facility]INIC C-ARM 1-60 MIN NO REPORT    Intraoperative interpretation by procedural physician at [facility].    Standing Status:   Standing    Number of Occurrences:   1    Reason for exam::   Assistance in needle guidance and placement for procedures requiring needle placement in or near specific anatomical locations not easily accessible without such assistance.   Informed Consent Details: Physician/Practitioner Attestation; Transcribe to consent form and obtain patient signature    Nursing Order: Transcribe to consent form and obtain patient signature. Note: Always confirm laterality of pain with Ms. Nylander, before procedure.    Physician/Practitioner attestation of informed  consent for procedure/surgical case:   I, the physician/practitioner, attest that I have discussed with the patient the benefits, risks, side effects, alternatives, likelihood of achieving goals and potential problems during recovery for the procedure that I have provided informed consent.    Procedure:   Lumbar Facet Block  under fluoroscopic guidance    Physician/Practitioner performing the procedure:   Panayiotis Rainville A. Tanya MD    Indication/Reason:   Low Back Pain, with our without leg pain, due to Facet Joint Arthralgia (Joint Pain)  Spondylosis (Arthritis of the Spine), without myelopathy or radiculopathy (Nerve Damage).   Provide equipment / supplies at bedside    Procedure tray: Block Tray (Disposable  single use) Skin infiltration needle: Regular 1.5-in, 25-G, (x1) Block Needle type: Spinal Amount/quantity: 4 Size: Medium (5-inch) Gauge: 22G    Standing Status:   Standing    Number of Occurrences:   1    Specify:   Block Tray   Saline lock IV    Have LR [phone] mL available and administer at 125 mL/hr if patient becomes hypotensive.    Standing Status:   Standing    Number of Occurrences:   1     Opioid Analgesic: None MME/day: 0 mg/day    Medications ordered for procedure: Meds ordered this encounter  Medications   lidocaine  (XYLOCAINE ) 2 % (with pres) injection 400 mg   pentafluoroprop-tetrafluoroeth (GEBAUERS) aerosol   midazolam  (VERSED ) 5 MG/5ML injection 0.5-2 mg    Make sure Flumazenil is available in the pyxis when using this medication. If oversedation occurs, administer 0.2 mg IV over 15 sec. If after 45 sec no response, administer 0.2 mg again over 1 min; may repeat at 1 min intervals; not to exceed 4 doses (1 mg)   fentaNYL  (SUBLIMAZE ) injection 25-50 mcg    Make sure Narcan is available in the pyxis when using this medication. In the event of respiratory depression (RR< 8/min): Titrate NARCAN (naloxone) in increments of 0.1 to 0.2 mg IV at 2-3 minute intervals, until desired degree of reversal.   ropivacaine  (PF) 2 mg/mL (0.2%) (NAROPIN ) injection 18 mL   triamcinolone  acetonide (KENALOG -40) injection 80 mg   Medications administered: We administered lidocaine , pentafluoroprop-tetrafluoroeth, midazolam , fentaNYL , ropivacaine  (PF) 2 mg/mL (0.2%), and triamcinolone  acetonide.  See the medical record for exact dosing, route, and time of administration.    Interventional Therapies  Risk Factors  Considerations  Medical Comorbidities:     Planned  Pending:   Diagnostic bilateral  lumbar facet MBB #2    Under consideration:      Completed: (Analgesic benefit)1  Diagnostic bilateral lumbar facet MBB x1 (12/19/2023) (100/100/20/20)    Therapeutic  Palliative (PRN) options:   None established   Completed by other providers:   None reported  1(Analgesic benefit): Expressed in percentage (%). (Local anesthetic[LA] +/- sedation  L.A.Local Anesthetic  Steroid benefit  Ongoing benefit)    Follow-up plan:   Return in about 2 weeks (around 01/30/2024) for (Face2F), (PPE).     Recent Visits Date Type Provider Dept  01/06/24 Office Visit Tanya Glisson, MD [facility]  12/19/23 Procedure visit Tanya Glisson, MD [facility]  12/11/23 Office Visit Tanya Glisson, MD [facility]  Showing recent visits within past 90 days and meeting all other requirements Today's Visits Date Type Provider Dept  01/16/24 Procedure visit Tanya Glisson, MD [facility]  Showing today's visits and meeting all other requirements Future Appointments Date Type Provider Dept  02/17/24 Appointment Tanya Glisson, MD  [facility]  Showing future appointments within next 90 days and meeting all other requirements   Disposition: Discharge home  Discharge (Date  Time): 01/16/2024; 1335 hrs.   Primary Care Physician: Alvia Selinda PARAS, MD Location: [facility] Note by: Eric DELENA Como, MD (TTS technology used. I apologize for any typographical errors that were not detected and corrected.) Date: 01/16/2024; Time: 2:09 PM  Disclaimer:  Medicine is not an visual merchandiser. The only guarantee in medicine is that nothing is guaranteed. It is important to note that the decision to proceed with this intervention was based on the information collected from the patient. The Data and conclusions were drawn from the patient's questionnaire, the interview, and the physical examination. Because the  information was provided in large part by the patient, it cannot be guaranteed that it has not been purposely or unconsciously manipulated. Every effort has been made to obtain as much relevant data as possible for this evaluation. It is important to note that the conclusions that lead to this procedure are derived in large part from the available data. Always take into account that the treatment will also be dependent on availability of resources and existing treatment guidelines, considered by other Pain Management Practitioners as being common knowledge and practice, at the time of the intervention. For Medico-Legal purposes, it is also important to point out that variation in procedural techniques and pharmacological choices are the acceptable norm. The indications, contraindications, technique, and results of the above procedure should only be interpreted and judged by a Board-Certified Interventional Pain Specialist with extensive familiarity and expertise in the same exact procedure and technique.

## 2024-01-16 NOTE — Patient Instructions (Addendum)
 ______________________________________________________________________    Post-Procedure Discharge Instructions  INSTRUCTIONS Apply ice:  Purpose: This will minimize any swelling and discomfort after procedure.  When: Day of procedure, as soon as you get home. How: Fill a plastic sandwich bag with crushed ice. Cover it with a small towel and apply to injection site. How long: (15 min on, 15 min off) Apply for 15 minutes then remove x 15 minutes.  Repeat sequence on day of procedure, until you go to bed. Apply heat:  Purpose: To treat any soreness and discomfort from the procedure. When: Starting the next day after the procedure. How: Apply heat to procedure site starting the day following the procedure. How long: May continue to repeat daily, until discomfort goes away. Food intake: Start with clear liquids (like water ) and advance to regular food, as tolerated.  Physical activities: Keep activities to a minimum for the first 8 hours after the procedure. After that, then as tolerated. Driving: If you have received any sedation, be responsible and do not drive. You are not allowed to drive for 24 hours after having sedation. Blood thinner: (Applies only to those taking blood thinners) You may restart your blood thinner 6 hours after your procedure. Insulin : (Applies only to Diabetic patients taking insulin ) As soon as you can eat, you may resume your normal dosing schedule. Infection prevention: Keep procedure site clean and dry. Shower daily and clean area with soap and water .  PAIN DIARY Post-procedure Pain Diary: Extremely important that this be done correctly and accurately. Recorded information will be used to determine the next step in treatment. For the purpose of accuracy, follow these rules: Evaluate only the area treated. Do not report or include pain from an untreated area. For the purpose of this evaluation, ignore all other areas of pain, except for the treated area. After your  procedure, avoid taking a long nap and attempting to complete the pain diary after you wake up. Instead, set your alarm clock to go off every hour, on the hour, for the initial 8 hours after the procedure. Document the duration of the numbing medicine, and the relief you are getting from it. Do not go to sleep and attempt to complete it later. It will not be accurate. If you received sedation, it is likely that you were given a medication that may cause amnesia. Because of this, completing the diary at a later time may cause the information to be inaccurate. This information is needed to plan your care. Follow-up appointment: Keep your post-procedure follow-up evaluation appointment after the procedure (usually 2 weeks for most procedures, 6 weeks for radiofrequencies). DO NOT FORGET to bring you pain diary with you.   EXPECT... (What should I expect to see with my procedure?) From numbing medicine (AKA: Local Anesthetics): Numbness or decrease in pain. You may also experience some weakness, which if present, could last for the duration of the local anesthetic. Onset: Full effect within 15 minutes of injected. Duration: It will depend on the type of local anesthetic used. On the average, 1 to 8 hours.  From steroids (Applies only if steroids were used): Decrease in swelling or inflammation. Once inflammation is improved, relief of the pain will follow. Onset of benefits: Depends on the amount of swelling present. The more swelling, the longer it will take for the benefits to be seen. In some cases, up to 10 days. Duration: Steroids will stay in the system x 2 weeks. Duration of benefits will depend on multiple posibilities including persistent irritating  factors. Side-effects: If present, they may typically last 2 weeks (the duration of the steroids). Frequent: Cramps (if they occur, drink Gatorade and take over-the-counter Magnesium  450-500 mg once to twice a day); water  retention with temporary weight  gain; increases in blood sugar; decreased immune system response; increased appetite. Occasional: Facial flushing (red, warm cheeks); mood swings; menstrual changes. Uncommon: Long-term decrease or suppression of natural hormones; bone thinning. (These are more common with higher doses or more frequent use. This is why we prefer that our patients avoid having any injection therapies in other practices.)  Very Rare: Severe mood changes; psychosis; aseptic necrosis. From procedure: Some discomfort is to be expected once the numbing medicine wears off. This should be minimal if ice and heat are applied as instructed.  CALL IF... (When should I call?) You experience numbness and weakness that gets worse with time, as opposed to wearing off. New onset bowel or bladder incontinence. (Applies only to procedures done in the spine)  Emergency Numbers: Durning business hours (Monday - Thursday, 8:00 AM - 4:00 PM) (Friday, 9:00 AM - 12:00 Noon): [phone] After hours: [phone] NOTE: If you are having a problem and are unable connect with, or to talk to a provider, then go to your nearest urgent care or emergency department. If the problem is serious and urgent, please call 911. ______________________________________________________________________     ______________________________________________________________________    Steroid injections  Common steroids for injections Triamcinolone : Used by many sports medicine physicians for large joint and bursal injections, often combined with a local anesthetic like lidocaine . A study focusing on coccydynia (tailbone pain) found triamcinolone  was more effective than betamethasone, suggesting it may also be preferable for other localized inflammation conditions. Methylprednisolone : A common alternative to triamcinolone  that is also a strong anti-inflammatory. It is available in different formulations, with the acetate suspension being the long-acting  option for intra-articular injections. Dexamethasone : This is a non-particulate steroid, meaning it has a lower risk of tissue damage compared to particulate steroids like triamcinolone  and methylprednisolone . While less common for this specific use, it is an option for targeted injections.   Considerations for physicians Particulate vs. non-particulate steroids: Triamcinolone  and methylprednisolone  are particulate, meaning they can clump together. Dexamethasone  is non-particulate. Particulate steroids are often preferred for their longer-lasting effects but carry a theoretical higher risk for certain injections (though this is less of a concern in the costochondral joints). Combined injectate: Corticosteroids are typically mixed with a local anesthetic like lidocaine  to provide both immediate pain relief (from the anesthetic) and longer-term inflammation reduction (from the steroid). Imaging guidance: To ensure accurate placement of the needle and medication, physicians may use ultrasound or fluoroscopic guidance for the injection, especially in complex or refractory cases.   Patient guidance Before undergoing a steroid injection, discuss the options with your physician. They will determine the best steroid, dosage, and procedure for your specific case based on factors like: Severity of your condition History of response to other treatments Your overall health status Experience and preference of the physician  Last  Updated: 09/24/2023 ______________________________________________________________________    Moderate Conscious Sedation, Adult, Care After After the procedure, it is common to have: Sleepiness for a few hours. Impaired judgment for a few hours. Trouble with balance. Nausea or vomiting if you eat too soon. Follow these instructions at home: For the time period you were told by your health care provider:  Rest. Do not participate in activities where you could fall or become  injured. Do not drive or  use machinery. Do not drink alcohol. Do not take sleeping pills or medicines that cause drowsiness. Do not make important decisions or sign legal documents. Do not take care of children on your own. Eating and drinking Follow instructions from your health care provider about what you may eat and drink. Drink enough fluid to keep your urine pale yellow. If you vomit: Drink clear fluids slowly and in small amounts as you are able. Clear fluids include water , ice chips, low-calorie sports drinks, and fruit juice that has water  added to it (diluted fruit juice). Eat light and bland foods in small amounts as you are able. These foods include bananas, applesauce, rice, lean meats, toast, and crackers. General instructions Take over-the-counter and prescription medicines only as told by your health care provider. Have a responsible adult stay with you for the time you are told. Do not use any products that contain nicotine or tobacco. These products include cigarettes, chewing tobacco, and vaping devices, such as e-cigarettes. If you need help quitting, ask your health care provider. Return to your normal activities as told by your health care provider. Ask your health care provider what activities are safe for you. Your health care provider may give you more instructions. Make sure you know what you can and cannot do. Contact a health care provider if: You are still sleepy or having trouble with balance after 24 hours. You feel light-headed. You vomit every time you eat or drink. You get a rash. You have a fever. You have redness or swelling around the IV site. Get help right away if: You have trouble breathing. You start to feel confused at home. These symptoms may be an emergency. Get help right away. Call 911. Do not wait to see if the symptoms will go away. Do not drive yourself to the hospital. This information is not intended to replace advice given to you by  your health care provider. Make sure you discuss any questions you have with your health care provider. Document Revised: 07/31/2021 Document Reviewed: 07/31/2021 [employer] Patient Education  2024 [employer].

## 2024-01-16 NOTE — Progress Notes (Signed)
 Safety precautions to be maintained throughout the outpatient stay will include: orient to surroundings, keep bed in low position, maintain call bell within reach at all times, provide assistance with transfer out of bed and ambulation.

## 2024-01-17 ENCOUNTER — Ambulatory Visit: Admitting: Family Medicine

## 2024-01-17 ENCOUNTER — Telehealth: Payer: Self-pay

## 2024-01-17 NOTE — Telephone Encounter (Signed)
Called PP. Denies any needs at this time. 

## 2024-01-20 NOTE — H&P (Signed)
 Dawn Hunter is an 43 y.o. [phone] female.   Chief Complaint: abnormal bleeding HPI: Has h/o heavy cycles and fibroids. Was on birth control x 3 months and having heavy cycles. Flared up again after stopping the birth control. S/p EMB in 2024 with negative results. U/s shows fibroid 6.5 cm. Overall uterine size is 11 cm.  [id number] SVD x 2 and C-s x 2. S/p BTL  Past Medical History:  Diagnosis Date   Anxiety 10/30/2013   Hypertension     Past Surgical History:  Procedure Laterality Date   CESAREAN SECTION     CHOLECYSTECTOMY  08/09/2013   TUBAL LIGATION      Family History  Problem Relation Age of Onset   Hypertension Mother    Arthritis Mother    Hypertension Father    Diabetes Father    Hypertension Maternal Grandmother    Diabetes Maternal Grandmother    Hypertension Maternal Grandfather    Hypertension Paternal Grandmother    Diabetes Paternal Grandmother    Cancer Paternal Grandmother        Throat   Hypertension Paternal Grandfather    Cancer Paternal Grandfather    Breast cancer Cousin    Hypertension Son    Social History:  reports that she has never smoked. She has never used smokeless tobacco. She reports that she does not currently use alcohol. She reports that she does not currently use drugs.  Allergies: Allergies[1]  No medications prior to admission.    A comprehensive review of systems was negative.  There were no vitals taken for this visit. General appearance: alert, cooperative, and appears stated age Head: Normocephalic, without obvious abnormality, atraumatic Neck: thyroid  not enlarged, symmetric, no tenderness/mass/nodules Lungs: normal effort Heart: regular rate and rhythm Abdomen: soft, non-tender; bowel sounds normal; no masses,  no organomegaly Extremities: extremities normal, atraumatic, no cyanosis or edema Skin: Skin color, texture, turgor normal. No rashes or lesions Neurologic: Grossly normal   No results found for this or any  previous visit (from the past 24 hours). DG PAIN CLINIC C-ARM 1-60 MIN NO REPORT Result Date: 01/16/2024 Fluoro was used, but no Radiologist interpretation will be provided. Please refer to NOTES tab for provider progress note.   Assessment/Plan Active Problems:   Subserous leiomyoma of uterus   Abnormal uterine bleeding (AUB)   Pelvic pain  For TVH with possible bilateral salpingectomy.  Risks include but are not limited to bleeding, infection, injury to surrounding structures, including bowel, bladder and ureters, blood clots, and death.  Likelihood of success is high.    Dawn Hunter 01/20/2024, 10:46 AM        [1] No Known Allergies

## 2024-01-28 ENCOUNTER — Telehealth: Payer: Self-pay

## 2024-01-28 NOTE — Telephone Encounter (Signed)
 Patient called to cancel upcoming surgery 02/18/24 and to schedule an appointment with Dr Fredirick to discuss other options. Scheduled her for an appointment on February 5,2026. Routing this message to Dawn Hunter to cancel her surgery.

## 2024-02-04 ENCOUNTER — Other Ambulatory Visit: Payer: Self-pay | Admitting: Family Medicine

## 2024-02-04 DIAGNOSIS — Z1231 Encounter for screening mammogram for malignant neoplasm of breast: Secondary | ICD-10-CM

## 2024-02-07 ENCOUNTER — Telehealth: Payer: Self-pay | Admitting: Plastic Surgery

## 2024-02-07 NOTE — Telephone Encounter (Signed)
 Pt stated she needed to cancel and rsch her pre-op for Mar 12, 2024 due to a death in the family.  Please advise when I can rsch her. Thank you

## 2024-02-10 ENCOUNTER — Ambulatory Visit: Admitting: Family Medicine

## 2024-02-10 ENCOUNTER — Encounter: Payer: Self-pay | Admitting: Family Medicine

## 2024-02-10 VITALS — BP 114/70 | HR 86 | Ht 67.0 in | Wt 221.0 lb

## 2024-02-10 DIAGNOSIS — R7303 Prediabetes: Secondary | ICD-10-CM | POA: Insufficient documentation

## 2024-02-10 DIAGNOSIS — I10 Essential (primary) hypertension: Secondary | ICD-10-CM | POA: Diagnosis not present

## 2024-02-10 DIAGNOSIS — N939 Abnormal uterine and vaginal bleeding, unspecified: Secondary | ICD-10-CM

## 2024-02-10 MED ORDER — HYDROCHLOROTHIAZIDE 12.5 MG PO TABS: 12.5000 mg | ORAL_TABLET | Freq: Every day | ORAL | 1 refills | Status: AC

## 2024-02-10 NOTE — Progress Notes (Signed)
 "    Primary Care / Sports Medicine Office Visit  Patient Information:  Patient ID: Dawn Hunter, female DOB: 11-23-1980 Age: 44 y.o. MRN: [id number]   Dawn Hunter is a pleasant 44 y.o. female presenting with the following:  Chief Complaint  Patient presents with   Medication Refill    Patient needs refill on her Hydrochlorothiazide . She wold also like to discuss concern for diabetes.     Vitals:   02/10/24 0909  BP: 114/70  Pulse: 86  SpO2: 98%   Vitals:   02/10/24 0909  Weight: 221 lb (100.2 kg)  Height: 5' 7 (1.702 m)   Body mass index is 34.61 kg/m.  DG PAIN CLINIC C-ARM 1-60 MIN NO REPORT Result Date: 01/16/2024 Fluoro was used, but no Radiologist interpretation will be provided. Please refer to NOTES tab for provider progress note.    Discussed the use of AI scribe software for clinical note transcription with the patient, who gave verbal consent to proceed.   Independent interpretation of notes and tests performed by another provider:   None  Procedures performed:   None  Pertinent History, Exam, Impression, and Recommendations:   History of Present Illness Dawn Hunter is a 44 year old female with hypertension, prediabetes, and menorrhagia who presents for preoperative management prior to planned breast reduction surgery.  Preoperative Planning: - Scheduled for breast reduction surgery on February 12th - Coordinated perioperative planning with surgical team - Arranged for her sister to provide postoperative assistance for several days  Abnormal Uterine Bleeding: - Ongoing heavy vaginal bleeding, increased in severity - Awaiting gynecology appointment on February 5th for further evaluation - Previously considered hysterectomy for risk reduction but prioritized breast reduction due to time constraints - Concerned about maintaining adequate hematologic status prior to surgery - Iron studies and hemoglobin within normal limits as of  October  Hypertension: - Recently ran out of hydrochlorothiazide  and has not taken it today - Recent episode of elevated blood pressure requiring emergency department visit  Glycemic Status Concerns: - Concerned about risk for diabetes due to family history and recent symptoms - Most recent hemoglobin A1c in October within normal limits - Interested in ongoing monitoring of glycemic status, especially in the context of upcoming surgery  Results Labs HbA1c:  Lab Results  Component Value Date   HGBA1C 5.9 (H) 11/19/2023   HGBA1C 6.1 (H) 11/15/2022   Iron studies (10/2023): Within normal limits  Assessment and Plan Hypertension Chronic hypertension, previously controlled on hydrochlorothiazide . Recent lapse in medication led to elevated blood pressure requiring emergency care. Blood pressure was normal today. Control is critical perioperatively for upcoming breast reduction surgery. - Refilled hydrochlorothiazide  for six months. - Scheduled follow-up in six months to reassess blood pressure control. - Emphasized adherence to antihypertensive therapy and importance of maintaining blood pressure control, particularly prior to surgery.  Prediabetes Strong family history of diabetes. Last A1c normal, not meeting prediabetes criteria. Monitoring glycemic status and screening for anemia important pre-surgery due to menorrhagia. - Ordered laboratory studies including A1c and complete blood count to reassess glycemic status and screen for anemia. - Instructed her to proceed to [facility] for laboratory testing. - Will communicate laboratory results via MyChart and determine need for further follow-up based on results.  Problem List Items Addressed This Visit     Abnormal uterine bleeding (AUB)   Relevant Orders   CBC with Differential/Platelet   Hypertension - Primary   Relevant Medications   hydrochlorothiazide  (HYDRODIURIL ) 12.5 MG tablet   Prediabetes  Relevant Orders   Hemoglobin  A1c     Orders & Medications Medications:  Meds ordered this encounter  Medications   hydrochlorothiazide  (HYDRODIURIL ) 12.5 MG tablet    Sig: Take 1 tablet (12.5 mg total) by mouth daily.    Dispense:  90 tablet    Refill:  1   Orders Placed This Encounter  Procedures   CBC with Differential/Platelet   Hemoglobin A1c     No follow-ups on file.     Selinda JINNY Ku, MD, [facility]   Primary Care Sports Medicine Primary Care and Sports Medicine at [facility]   "

## 2024-02-10 NOTE — Patient Instructions (Signed)
 VISIT SUMMARY:  Today we discussed your preoperative management for your upcoming breast reduction surgery. We addressed your hypertension, prediabetes concerns, and abnormal uterine bleeding.  YOUR PLAN:  HYPERTENSION: Your chronic hypertension needs to be well-controlled before your surgery. It currently is. -Refilled your hydrochlorothiazide  for six months. -Scheduled a follow-up appointment in six months to reassess your blood pressure control. -It is important to take your blood pressure medication as prescribed, especially before your surgery.  PREDIABETES: You have a strong family history of diabetes, and we need to monitor your glycemic status. -Ordered laboratory tests including A1c and complete blood count to check your blood sugar levels and screen for anemia. -Please go to [facility] for your laboratory testing. -We will communicate your lab results via MyChart and decide if further follow-up is needed based on the results.  ABNORMAL UTERINE BLEEDING: You have ongoing heavy vaginal bleeding and are awaiting further evaluation. -You have an upcoming gynecology appointment on February 5th for further evaluation. -Your iron studies and hemoglobin levels were normal as of October.

## 2024-02-11 ENCOUNTER — Institutional Professional Consult (permissible substitution) (INDEPENDENT_AMBULATORY_CARE_PROVIDER_SITE_OTHER): Admitting: Adult Health

## 2024-02-11 ENCOUNTER — Ambulatory Visit: Payer: Self-pay | Admitting: Family Medicine

## 2024-02-11 LAB — CBC WITH DIFFERENTIAL/PLATELET
Basophils Absolute: 0 x10E3/uL (ref 0.0–0.2)
Basos: 0 %
EOS (ABSOLUTE): 0 x10E3/uL (ref 0.0–0.4)
Eos: 1 %
Hematocrit: 44.2 % (ref 34.0–46.6)
Hemoglobin: 14.7 g/dL (ref 11.1–15.9)
Immature Grans (Abs): 0 x10E3/uL (ref 0.0–0.1)
Immature Granulocytes: 0 %
Lymphocytes Absolute: 1.4 x10E3/uL (ref 0.7–3.1)
Lymphs: 17 %
MCH: 32 pg (ref 26.6–33.0)
MCHC: 33.3 g/dL (ref 31.5–35.7)
MCV: 96 fL (ref 79–97)
Monocytes Absolute: 0.6 x10E3/uL (ref 0.1–0.9)
Monocytes: 7 %
Neutrophils Absolute: 6.2 x10E3/uL (ref 1.4–7.0)
Neutrophils: 75 %
Platelets: 312 x10E3/uL (ref 150–450)
RBC: 4.6 x10E6/uL (ref 3.77–5.28)
RDW: 13.4 % (ref 11.7–15.4)
WBC: 8.3 x10E3/uL (ref 3.4–10.8)

## 2024-02-11 LAB — HEMOGLOBIN A1C
Est. average glucose Bld gHb Est-mCnc: 123 mg/dL
Hgb A1c MFr Bld: 5.9 % — ABNORMAL HIGH (ref 4.8–5.6)

## 2024-02-14 ENCOUNTER — Encounter: Admitting: Student

## 2024-02-16 NOTE — Progress Notes (Unsigned)
 PROVIDER NOTE: Interpretation of information contained herein should be left to medically-trained personnel. Specific patient instructions are provided elsewhere under Patient Instructions section of medical record. This document was created in part using AI and STT-dictation technology, any transcriptional errors that may result from this process are unintentional.  Patient: Dawn Hunter  Service: E/M   PCP: Alvia Selinda PARAS, MD  DOB: May 19, 1980  DOS: 02/17/2024  Provider: Eric DELENA Como, MD  MRN: [id number]  Delivery: Face-to-face  Specialty: Interventional Pain Management  Type: Established Patient  Setting: Ambulatory outpatient facility  Specialty designation: 09  Referring Prov.: Alvia Selinda PARAS, MD  Location: Outpatient office facility       History of present illness (HPI) Ms. Dawn Hunter, a 44 y.o. year old female, is here today because of her Low back pain of over 3 months duration [M54.50]. Ms. Dawn Hunter's primary complain today is No chief complaint on file.  Pertinent problems: Ms. Dawn Hunter has Lumbar spondylosis; Chronic pain syndrome; Sacroiliac joint arthropathy (Left); Rotator cuff syndrome (Bilateral); Pelvic pain; Abnormal MRI, lumbar spine (11/08/2023); Low back pain of over 3 months duration; Low back pain radiating to both legs; Multifactorial low back pain; Chronic mid back pain; Chronic low back pain (Bilateral) w/o sciatica; Chronic lower extremity pain (Bilateral); Lumbar facet hypertrophy (Multilevel) (Bilateral); Lumbar facet joint pain; Lumbar facet joint syndrome (Bilateral); and Spondylosis without myelopathy or radiculopathy, lumbar region on their pertinent problem list.  Pain Assessment: Severity of   is reported as a  /10. Location:    / . Onset:  . Quality:  . Timing:  . Modifying factor(s):  SABRA Vitals:  vitals were not taken for this visit.  BMI: Estimated body mass index is 34.61 kg/m as calculated from the following:   Height as of 02/10/24: 5' 7  (1.702 m).   Weight as of 02/10/24: 221 lb (100.2 kg).  Last encounter: 01/06/2024. Last procedure: 01/16/2024.  Reason for encounter: post-procedure evaluation and assessment.   Discussed the use of AI scribe software for clinical note transcription with the patient, who gave verbal consent to proceed.  History of Present Illness          Post-Procedure Evaluation   Type: Lumbar Facet, Medial Branch Block(s)   #2  Laterality: Bilateral  Level: L2, L3, L4, L5, and S1 Medial Branch/Dorsal Rami Level(s). Injecting these levels blocks the L3-4, L4-5, and L5-S1 lumbar facet joints. Imaging: Fluoroscopic guidance Spinal ([id number]) Anesthesia: Local anesthesia (1-2% Lidocaine ) Anxiolysis: IV Versed  2.0 mg            Sedation: Minimal Sedation Fentanyl  1 mL (50 mcg) DOS: 01/16/2024 Performed by: Eric DELENA Como, MD  Primary Purpose: Diagnostic/Therapeutic Indications: Low back pain severe enough to impact quality of life or function. 1. Chronic low back pain (Bilateral) w/o sciatica   2. Low back pain of over 3 months duration   3. Lumbar facet joint pain   4. Lumbar facet hypertrophy (Multilevel) (Bilateral)   5. Lumbar facet joint syndrome (Bilateral)   6. Low back pain radiating to both legs   7. Spondylosis without myelopathy or radiculopathy, lumbar region    NAS-11 Pain score:   Pre-procedure: 9 /10   Post-procedure: 0-No pain/10     Effectiveness:  Initial hour after procedure:   ***. Subsequent 4-6 hours post-procedure:   ***. Analgesia past initial 6 hours:   ***. Ongoing improvement:  Analgesic:  *** Function:    ***    ROM:    ***    Interpretation: ***  Pharmacotherapy Assessment   Opioid Analgesic: None MME/day: 0 mg/day   Monitoring: [place] PMP: PDMP reviewed during this encounter.       Pharmacotherapy: No side-effects or adverse reactions reported. Compliance: No problems identified. Effectiveness: Clinically acceptable.  No notes on file  UDS:   Summary  Date Value Ref Range Status  12/11/2023 FINAL  Final    Comment:    ==================================================================== Compliance Drug Analysis, Ur ==================================================================== Test                             Result       Flag       Units    NO DRUGS DETECTED. ==================================================================== Test                      Result    Flag   Units      Ref Range   Creatinine              224              mg/dL      >=79 ==================================================================== Declared Medications:  Medication list was not provided. ==================================================================== For clinical consultation, please call [phone]. ====================================================================     No results found for: CBDTHCR No results found for: D8THCCBX No results found for: D9THCCBX  ROS  Constitutional: Denies any fever or chills Gastrointestinal: No reported hemesis, hematochezia, vomiting, or acute GI distress Musculoskeletal: Denies any acute onset joint swelling, redness, loss of ROM, or weakness Neurological: No reported episodes of acute onset apraxia, aphasia, dysarthria, agnosia, amnesia, paralysis, loss of coordination, or loss of consciousness  Medication Review  hydrochlorothiazide   History Review  Allergy: Ms. Dawn Hunter has no known allergies. Drug: Ms. Dawn Hunter  reports that she does not currently use drugs. Alcohol:  reports that she does not currently use alcohol. Tobacco:  reports that she has never smoked. She has never used smokeless tobacco. Social: Ms. Dawn Hunter  reports that she has never smoked. She has never used smokeless tobacco. She reports that she does not currently use alcohol. She reports that she does not currently use drugs. Medical:  has a past medical history of Anxiety (10/30/2013) and  Hypertension. Surgical: Ms. Dawn Hunter  has a past surgical history that includes Cesarean section; Tubal ligation; and Cholecystectomy (08/09/2013). Family: family history includes Arthritis in her mother; Breast cancer in her cousin; Cancer in her paternal grandfather and paternal grandmother; Diabetes in her father, maternal grandmother, and paternal grandmother; Hypertension in her father, maternal grandfather, maternal grandmother, mother, paternal grandfather, paternal grandmother, and son.  Laboratory Chemistry Profile   Renal Lab Results  Component Value Date   BUN 12 11/19/2023   CREATININE 0.77 11/19/2023   BCR 16 11/19/2023   GFRNONAA >60 04/17/2023    Hepatic Lab Results  Component Value Date   AST 16 11/19/2023   ALT 13 11/19/2023   ALBUMIN 4.7 11/19/2023   ALKPHOS 77 11/19/2023    Electrolytes Lab Results  Component Value Date   NA 139 11/19/2023   K 4.3 11/19/2023   CL 101 11/19/2023   CALCIUM 9.5 11/19/2023   MG 2.0 12/11/2023    Bone Lab Results  Component Value Date   VD25OH 16.6 (L) 11/15/2022   25OHVITD1 17 (L) 12/11/2023   25OHVITD2 7.3 12/11/2023   25OHVITD3 10.0 12/11/2023    Inflammation (CRP: Acute Phase) (ESR: Chronic Phase) Lab Results  Component Value Date   CRP 7  12/11/2023   ESRSEDRATE 18 12/11/2023         Note: Above Lab results reviewed.  Recent Imaging Review  DG PAIN CLINIC C-ARM 1-60 MIN NO REPORT Fluoro was used, but no Radiologist interpretation will be provided.  Please refer to NOTES tab for provider progress note. Note: Reviewed        Physical Exam  Vitals: There were no vitals taken for this visit. BMI: Estimated body mass index is 34.61 kg/m as calculated from the following:   Height as of 02/10/24: 5' 7 (1.702 m).   Weight as of 02/10/24: 221 lb (100.2 kg). Ideal: Ideal body weight: 61.6 kg (135 lb 12.9 oz) Adjusted ideal body weight: 77.1 kg (169 lb 14.1 oz) General appearance: Well nourished, well developed, and  well hydrated. In no apparent acute distress Mental status: Alert, oriented x 3 (person, place, & time)       Respiratory: No evidence of acute respiratory distress Eyes: PERLA   Assessment   Diagnosis Status  1. Low back pain of over 3 months duration   2. Lumbar facet joint pain   3. Chronic low back pain (Bilateral) w/o sciatica   4. Postop check    Controlled Controlled Controlled   Updated Problems: No problems updated.  Plan of Care  Problem-specific:  Assessment and Plan            Ms. Dawn Hunter has a current medication list which includes the following long-term medication(s): hydrochlorothiazide .  Pharmacotherapy (Medications Ordered): No orders of the defined types were placed in this encounter.  Orders:  No orders of the defined types were placed in this encounter.    Interventional Therapies  Risk Factors  Considerations  Medical Comorbidities:     Planned  Pending:   Diagnostic bilateral lumbar facet MBB #2    Under consideration:      Completed: (Analgesic benefit)1  Diagnostic bilateral lumbar facet MBB x1 (12/19/2023) (100/100/20/20)    Therapeutic  Palliative (PRN) options:   None established   Completed by other providers:   None reported  1(Analgesic benefit): Expressed in percentage (%). (Local anesthetic[LA] +/- sedation  L.A.Local Anesthetic  Steroid benefit  Ongoing benefit)   No follow-ups on file.    Recent Visits Date Type Provider Dept  01/16/24 Procedure visit Tanya Glisson, MD [facility]  01/06/24 Office Visit Tanya Glisson, MD [facility]  12/19/23 Procedure visit Tanya Glisson, MD [facility]  12/11/23 Office Visit Tanya Glisson, MD [facility]  Showing recent visits within past 90 days and meeting all other requirements Future Appointments Date Type Provider Dept  02/17/24 Appointment Tanya Glisson, MD [facility]  Showing future  appointments within next 90 days and meeting all other requirements  I discussed the assessment and treatment plan with the patient. The patient was provided an opportunity to ask questions and all were answered. The patient agreed with the plan and demonstrated an understanding of the instructions.  Patient advised to call back or seek an in-person evaluation if the symptoms or condition worsens.  Duration of encounter: *** minutes.  Total time on encounter, as per AMA guidelines included both the face-to-face and non-face-to-face time personally spent by the physician and/or other qualified health care professional(s) on the day of the encounter (includes time in activities that require the physician or other qualified health care professional and does not include time in activities normally performed by clinical staff). Physician's time may include the following activities when performed:  Preparing to see the patient (e.g., pre-charting review of records, searching for previously ordered imaging, lab work, and nerve conduction tests) Review of prior analgesic pharmacotherapies. Reviewing PMP Interpreting ordered tests (e.g., lab work, imaging, nerve conduction tests) Performing post-procedure evaluations, including interpretation of diagnostic procedures Obtaining and/or reviewing separately obtained history Performing a medically appropriate examination and/or evaluation Counseling and educating the patient/family/caregiver Ordering medications, tests, or procedures Referring and communicating with other health care professionals (when not separately reported) Documenting clinical information in the electronic or other health record Independently interpreting results (not separately reported) and communicating results to the patient/ family/caregiver Care coordination (not separately reported)  Note by: Eric DELENA Como, MD (TTS and AI technology used. I apologize for any typographical  errors that were not detected and corrected.) Date: 02/17/2024; Time: 7:10 PM

## 2024-02-17 ENCOUNTER — Ambulatory Visit: Admitting: Pain Medicine

## 2024-02-17 DIAGNOSIS — Z09 Encounter for follow-up examination after completed treatment for conditions other than malignant neoplasm: Secondary | ICD-10-CM

## 2024-02-17 DIAGNOSIS — M545 Low back pain, unspecified: Secondary | ICD-10-CM

## 2024-02-17 DIAGNOSIS — Z91199 Patient's noncompliance with other medical treatment and regimen due to unspecified reason: Secondary | ICD-10-CM

## 2024-02-17 DIAGNOSIS — M5459 Other low back pain: Secondary | ICD-10-CM

## 2024-02-17 DIAGNOSIS — G8929 Other chronic pain: Secondary | ICD-10-CM

## 2024-02-18 ENCOUNTER — Ambulatory Visit (HOSPITAL_COMMUNITY): Admit: 2024-02-18 | Admitting: Family Medicine

## 2024-02-18 DIAGNOSIS — N939 Abnormal uterine and vaginal bleeding, unspecified: Secondary | ICD-10-CM | POA: Diagnosis present

## 2024-02-18 DIAGNOSIS — R102 Pelvic and perineal pain unspecified side: Secondary | ICD-10-CM | POA: Diagnosis present

## 2024-02-18 DIAGNOSIS — D252 Subserosal leiomyoma of uterus: Secondary | ICD-10-CM | POA: Diagnosis present

## 2024-02-18 SURGERY — HYSTERECTOMY, VAGINAL
Anesthesia: Choice

## 2024-02-26 ENCOUNTER — Telehealth: Payer: Self-pay

## 2024-02-26 DIAGNOSIS — I10 Essential (primary) hypertension: Secondary | ICD-10-CM

## 2024-02-26 NOTE — Progress Notes (Signed)
 Complex Care Management Note Care Guide Note  02/26/2024 Name: Dawn Hunter MRN: [id number] DOB: [birth year]   Complex Care Management Outreach Attempts: An unsuccessful telephone outreach was attempted today to offer the patient information about available complex care management services.  Follow Up Plan:  Additional outreach attempts will be made to offer the patient complex care management information and services.   Encounter Outcome:  No Answer  Dreama [facility]  [facility], [facility] VBCI Assistant Direct Dial: [phone]  Fax: [phone]

## 2024-03-03 ENCOUNTER — Ambulatory Visit
Admission: RE | Admit: 2024-03-03 | Discharge: 2024-03-03 | Disposition: A | Source: Ambulatory Visit | Attending: Family Medicine

## 2024-03-03 DIAGNOSIS — Z1231 Encounter for screening mammogram for malignant neoplasm of breast: Secondary | ICD-10-CM

## 2024-03-03 NOTE — Progress Notes (Signed)
 Complex Care Management Note  Care Guide Note 03/03/2024 Name: Dawn Hunter MRN: [id number] DOB: 07/20/1980  Dawn Hunter is a 44 y.o. year old female who sees Alvia, Selinda PARAS, MD for primary care. I reached out to Terry Lager by phone today to offer complex care management services.  Dawn Hunter was given information about Complex Care Management services today including:   The Complex Care Management services include support from the care team which includes your Nurse Care Manager, Clinical Social Worker, or Pharmacist.  The Complex Care Management team is here to help remove barriers to the health concerns and goals most important to you. Complex Care Management services are voluntary, and the patient may decline or stop services at any time by request to their care team member.   Complex Care Management Consent Status: Patient agreed to services and verbal consent obtained.   Follow up plan:  Telephone appointment with complex care management team member scheduled for:  03/16/24 at 9:00 a.m.  Encounter Outcome:  Patient Scheduled  Dreama [facility]  [facility], [facility] VBCI Assistant Direct Dial: [phone]  Fax: [phone]

## 2024-03-04 ENCOUNTER — Encounter: Payer: Self-pay | Admitting: Student

## 2024-03-04 ENCOUNTER — Ambulatory Visit: Admitting: Student

## 2024-03-04 VITALS — BP 132/92 | HR 89 | Ht 67.0 in | Wt 221.4 lb

## 2024-03-04 DIAGNOSIS — N62 Hypertrophy of breast: Secondary | ICD-10-CM

## 2024-03-04 MED ORDER — CEPHALEXIN 500 MG PO CAPS: 500.0000 mg | ORAL_CAPSULE | Freq: Four times a day (QID) | ORAL | 0 refills | Status: AC

## 2024-03-04 MED ORDER — OXYCODONE HCL 5 MG PO TABS: 5.0000 mg | ORAL_TABLET | Freq: Three times a day (TID) | ORAL | 0 refills | Status: AC | PRN

## 2024-03-04 MED ORDER — ONDANSETRON HCL 4 MG PO TABS: 4.0000 mg | ORAL_TABLET | Freq: Three times a day (TID) | ORAL | 0 refills | Status: AC | PRN

## 2024-03-04 NOTE — Progress Notes (Cosign Needed)
 "    Patient ID: Dawn Hunter, female    DOB: 1980-12-06, 44 y.o.   MRN: [id number]  Chief Complaint  Patient presents with   Pre-op Exam      ICD-10-CM   1. Symptomatic mammary hypertrophy  N62        History of Present Illness: Dawn Hunter is a 44 y.o.  female  with a history of macromastia.  She presents for preoperative evaluation for upcoming procedure, Bilateral Breast Reduction with lipo suction, scheduled for 03/12/2024 with Dr.  Lowery  The patient has not had problems with anesthesia.  Patient reports that she had her mammogram done yesterday [place].  She states that she still has not gotten the results yet.  Patient denies any personal family history of breast cancer.  She denies any history of cardiac disease.  She denies taking any blood thinners.  Patient reports she is not a smoker.  Patient denies taking any birth control or hormone replacement.  She denies any history of greater than 3 miscarriages.  She denies any personal family history of blood clots or clotting diseases.  She denies any recent surgeries, traumas or infections.  She denies any history of stroke or heart attack.  She denies any history of Crohn's disease or ulcerative colitis, COPD or asthma.  She denies any history of cancer.  She denies any varicosities to her lower extremities.  She denies any recent fevers, chills or changes in her health.  Patient reports she is currently in an H and states that she wants to be as small as possible, possibly a B cup.  I discussed with the patient that cup size cannot be guaranteed.  She expressed understanding.  Summary of Previous Visit: Patient was seen by Dr. Lowery on 01/14/2024.  At this visit, patient complained of upper back and neck pain due to enlarged breasts.  On exam, patient's STN on the right was 39 cm and her STN on the left was 40 cm.  Her BMI was 34.6 kg/m.  Her preoperative bra size was a 44H cup.  The estimated amount of excess breast  tissue to be removed at the time of surgery was 750-800 g bilaterally.  Patient was found to be a good candidate for bilateral breast reduction with lipo suction.  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  Plan to take off 3 weeks, and then go back light duty.  She understands that she will have restrictions for 6 weeks.  PMH Significant for: Hypertension, lumbar spondylosis, macromastia   Past Medical History: Allergies: Allergies[1]  Current Medications: Current Medications[2]  Past Medical Problems: Past Medical History:  Diagnosis Date   Anxiety 10/30/2013   Hypertension     Past Surgical History: Past Surgical History:  Procedure Laterality Date   CESAREAN SECTION     CHOLECYSTECTOMY  08/09/2013   TUBAL LIGATION      Social History: Social History   Socioeconomic History   Marital status: Single    Spouse name: Not on file   Number of children: Not on file   Years of education: Not on file   Highest education level: Some college, no degree  Occupational History   Not on file  Tobacco Use   Smoking status: Never   Smokeless tobacco: Never  Vaping Use   Vaping status: Never Used  Substance and Sexual Activity   Alcohol use: Not  Currently   Drug use: Not Currently   Sexual activity: Yes  Other Topics Concern   Not on file  Social History Narrative   Not on file   Social Drivers of Health   Tobacco Use: Low Risk (03/04/2024)   Patient History    Smoking Tobacco Use: Never    Smokeless Tobacco Use: Never    Passive Exposure: Not on file  Financial Resource Strain: Medium Risk (11/19/2023)   Overall Financial Resource Strain (CARDIA)    Difficulty of Paying Living Expenses: Somewhat hard  Food Insecurity: Food Insecurity Present (11/19/2023)   Epic    Worried About Programme Researcher, Broadcasting/film/video in the Last Year: Sometimes true    Ran Out of Food in the  Last Year: Sometimes true  Transportation Needs: No Transportation Needs (11/19/2023)   Epic    Lack of Transportation (Medical): No    Lack of Transportation (Non-Medical): No  Physical Activity: Sufficiently Active (11/19/2023)   Exercise Vital Sign    Days of Exercise per Week: 7 days    Minutes of Exercise per Session: 60 min  Stress: Stress Concern Present (11/19/2023)   [employer] of Occupational Health - Occupational Stress Questionnaire    Feeling of Stress: To some extent  Social Connections: Unknown (11/19/2023)   Social Connection and Isolation Panel    Frequency of Communication with Friends and Family: More than three times a week    Frequency of Social Gatherings with Friends and Family: Twice a week    Attends Religious Services: More than 4 times per year    Active Member of [employer] or Organizations: No    Attends Banker Meetings: Not on file    Marital Status: Patient declined  Intimate Partner Violence: Not At Risk (04/27/2022)   Humiliation, Afraid, Rape, and Kick questionnaire    Fear of Current or Ex-Partner: No    Emotionally Abused: No    Physically Abused: No    Sexually Abused: No  Depression (PHQ2-9): Low Risk (02/10/2024)   Depression (PHQ2-9)    PHQ-2 Score: 0  Recent Concern: Depression (PHQ2-9) - Medium Risk (11/19/2023)   Depression (PHQ2-9)    PHQ-2 Score: 6  Alcohol Screen: Low Risk (11/19/2023)   Alcohol Screen    Last Alcohol Screening Score (AUDIT): 0  Housing: High Risk (11/19/2023)   Epic    Unable to Pay for Housing in the Last Year: Yes    Number of Times Moved in the Last Year: 1    Homeless in the Last Year: No  Utilities: Not At Risk (04/27/2022)   [employer] Utilities    Threatened with loss of utilities: No  Health Literacy: Not on file    Family History: Family History  Problem Relation Age of Onset   Hypertension Mother    Arthritis Mother    Hypertension Father    Diabetes Father    Hypertension Maternal  Grandmother    Diabetes Maternal Grandmother    Hypertension Maternal Grandfather    Hypertension Paternal Grandmother    Diabetes Paternal Grandmother    Cancer Paternal Grandmother        Throat   Hypertension Paternal Grandfather    Cancer Paternal Grandfather    Breast cancer Cousin    Hypertension Son     Review of Systems: Denies any recent fevers, chills or changes in her health  Physical Exam: Vital Signs BP (!) 132/92 (BP Location: Left Arm, Patient Position: Sitting, Cuff Size: Large)   Pulse 89  Ht 5' 7 (1.702 m)   Wt 221 lb 6.4 oz (100.4 kg)   SpO2 96%   BMI 34.68 kg/m   Physical Exam  Constitutional:      General: Not in acute distress.    Appearance: Normal appearance. Not ill-appearing.  HENT:     Head: Normocephalic and atraumatic.  Eyes:     Pupils: Pupils are equal, round Cardiovascular:     Rate and Rhythm: Normal rate Pulmonary:     Effort: Pulmonary effort is normal. No respiratory distress.  Skin:    Findings: No erythema or rash.  Neurological:     Mental Status: Alert and oriented to person, place, and time. Mental status is at baseline.  Psychiatric:        Mood and Affect: Mood normal.        Behavior: Behavior normal.    Assessment/Plan: The patient is scheduled for bilateral breast reduction with Dr. Lowery.  Risks, benefits, and alternatives of procedure discussed, questions answered and consent obtained.    Smoking Status: Non-smoker; Counseling Given?  N/A Last Mammogram: Per chart review, patient had her mammogram done yesterday, 03/03/2024; Results: Results are still pending.  I discussed with the patient that we will need these results prior to surgery.  I discussed with her that she should call us  when she does get the results.  We did discuss that if her mammogram is abnormal, it could delay her surgery.  She expressed understanding.  Caprini Score: 4; Risk Factors include: Age, BMI > 25, and length of planned surgery.  Recommendation for mechanical prophylaxis. Encourage early ambulation.   Pictures obtained: @consult   Post-op Rx sent to pharmacy: Keflex , oxycodone , Zofran -patient requested that only a few pills of oxycodone  be sent in.  Recommended that she take Tylenol  and ibuprofen for pain and only take a pain pill if necessary.  Patient expressed understanding.  Discussed with the patient to hold her hydrochlorothiazide  the day of surgery.  Discussed with her to hold any multivitamins, vitamins or supplements at least 1 week prior to surgery.  Patient expressed understanding.  Patient was provided with the breast reduction and General Surgical Risk consent document and Pain Medication Agreement prior to their appointment.  They had adequate time to read through the risk consent documents and Pain Medication Agreement. We also discussed them in person together during this preop appointment. All of their questions were answered to their satisfaction.  Recommended calling if they have any further questions.  Risk consent form and Pain Medication Agreement to be scanned into patient's chart.  The risk that can be encountered with breast reduction were discussed and include the following but not limited to these:  Breast asymmetry, fluid accumulation, firmness of the breast, inability to breast feed, loss of nipple or areola, skin loss, decrease or no nipple sensation, fat necrosis of the breast tissue, bleeding, infection, healing delay.  There are risks of anesthesia, changes to skin sensation and injury to nerves or blood vessels.  The muscle can be temporarily or permanently injured.  You may have an allergic reaction to tape, suture, glue, blood products which can result in skin discoloration, swelling, pain, skin lesions, poor healing.  Any of these can lead to the need for revisonal surgery or stage procedures.  A reduction has potential to interfere with diagnostic procedures.  Nipple or breast piercing can  increase risks of infection.  This procedure is best done when the breast is fully developed.  Changes in the breast will continue  to occur over time.  Pregnancy can alter the outcomes of previous breast reduction surgery, weight gain and weigh loss can also effect the long term appearance.   We discussed the possibility of amputation/free nipple graft technique due to the length of her STN.  She is understanding of the possibility that we would need to transition from a pedicle technique to a free nipple graft technique intraoperatively.  We discussed the risks associated with free nipple graft breast reductions, including but not limited to failure of the graft, partial loss of the graft, loss of sensation of bilateral nipple areola, complete loss of the nipple areola graft, inability to breast-feed, postoperative wounds, ongoing wound care.  We also discussed the risks associated with the pedicle technique.  We discussed that with the pedicle technique she could develop nipple areolar necrosis which would result in loss of the nipple, this would also result in ongoing wound care and possible changes in the shape of her breast.     Electronically signed by: Estefana FORBES Peck, PA-C 03/04/2024 12:51 PM       [1] No Known Allergies [2]  Current Outpatient Medications:    cephALEXin  (KEFLEX ) 500 MG capsule, Take 1 capsule (500 mg total) by mouth 4 (four) times daily for 3 days., Disp: 12 capsule, Rfl: 0   hydrochlorothiazide  (HYDRODIURIL ) 12.5 MG tablet, Take 1 tablet (12.5 mg total) by mouth daily., Disp: 90 tablet, Rfl: 1   ondansetron  (ZOFRAN ) 4 MG tablet, Take 1 tablet (4 mg total) by mouth every 8 (eight) hours as needed for up to 10 doses for nausea or vomiting., Disp: 10 tablet, Rfl: 0   oxyCODONE  (ROXICODONE ) 5 MG immediate release tablet, Take 1 tablet (5 mg total) by mouth every 8 (eight) hours as needed for up to 5 doses for severe pain (pain score 7-10)., Disp: 5 tablet, Rfl: 0  "

## 2024-03-05 ENCOUNTER — Ambulatory Visit: Admitting: Family Medicine

## 2024-03-05 ENCOUNTER — Other Ambulatory Visit: Payer: Self-pay

## 2024-03-05 ENCOUNTER — Other Ambulatory Visit (HOSPITAL_COMMUNITY): Admission: RE | Admit: 2024-03-05 | Source: Ambulatory Visit

## 2024-03-05 ENCOUNTER — Ambulatory Visit

## 2024-03-05 ENCOUNTER — Encounter (HOSPITAL_BASED_OUTPATIENT_CLINIC_OR_DEPARTMENT_OTHER): Payer: Self-pay | Admitting: Plastic Surgery

## 2024-03-05 VITALS — BP 120/85 | HR 99 | Ht 65.0 in | Wt 226.0 lb

## 2024-03-05 DIAGNOSIS — D252 Subserosal leiomyoma of uterus: Secondary | ICD-10-CM | POA: Diagnosis not present

## 2024-03-05 DIAGNOSIS — N898 Other specified noninflammatory disorders of vagina: Secondary | ICD-10-CM

## 2024-03-05 DIAGNOSIS — N939 Abnormal uterine and vaginal bleeding, unspecified: Secondary | ICD-10-CM

## 2024-03-05 MED ORDER — NORETHINDRONE ACETATE 5 MG PO TABS: 5.0000 mg | ORAL_TABLET | Freq: Every day | ORAL | 3 refills | Status: AC

## 2024-03-05 NOTE — Assessment & Plan Note (Addendum)
 Discussed options, including IUD and depo. She has been counseled on endometrial ablation, Sontata, UFE. Will seek input from parnter performing Sonata to see if good candidate. If not, will refer for UFE. Begin Aygestin  to ameliorate bleeding. Hyst is still possible, but she does not want to miss too much work this year. Risks of surgery reviewed. May have a component of adenomyosis as well, given prev C-section x 2.

## 2024-03-05 NOTE — Progress Notes (Signed)
" ° °  Subjective:    Patient ID: Dawn Hunter is a 44 y.o. female presenting with Consult and Follow-up  on 03/05/2024  HPI: H/o abnormal bleeding. This pt. Initially wanted a hyst, but is now having a breast reduction and missing a lot of work for that. She has a h/o C-s x 2 and she has a 6.5 cm fibroid. Considering Sonata or UFE. Wants something to stop bleeding. Also, reports vaginal odor.   Review of Systems  Constitutional:  Negative for chills and fever.  Respiratory:  Negative for shortness of breath.   Cardiovascular:  Negative for chest pain.  Gastrointestinal:  Negative for abdominal pain, nausea and vomiting.  Genitourinary:  Negative for dysuria.  Skin:  Negative for rash.      Objective:    BP 120/85   Pulse 99   Ht 5' 5 (1.651 m)   Wt 226 lb (102.5 kg)   LMP 02/06/2024 (Approximate)   BMI 37.61 kg/m  Physical Exam Exam conducted with a chaperone present.  Constitutional:      General: She is not in acute distress.    Appearance: She is well-developed.  HENT:     Head: Normocephalic and atraumatic.  Eyes:     General: No scleral icterus. Cardiovascular:     Rate and Rhythm: Normal rate.  Pulmonary:     Effort: Pulmonary effort is normal.  Abdominal:     Palpations: Abdomen is soft.  Musculoskeletal:     Cervical back: Neck supple.  Skin:    General: Skin is warm and dry.  Neurological:     Mental Status: She is alert and oriented to person, place, and time.         Assessment & Plan:   Problem List Items Addressed This Visit       Unprioritized   Subserous leiomyoma of uterus   Treatment options reviewed.      Abnormal uterine bleeding (AUB)   Discussed options, including IUD and depo. She has been counseled on endometrial ablation, Sontata, UFE. Will seek input from parnter performing Sonata to see if good candidate. If not, will refer for UFE. Begin Aygestin  to ameliorate bleeding. Hyst is still possible, but she does not want to miss  too much work this year. Risks of surgery reviewed. May have a component of adenomyosis as well, given prev C-section x 2.      Relevant Medications   norethindrone  (AYGESTIN ) 5 MG tablet   Other Visit Diagnoses       Vaginal odor    -  Primary   check wet prep and treat accordingly   Relevant Orders   Cervicovaginal ancillary only( [place])        Return in about 3 months (around 06/02/2024) for a follow-up.  [name], MD 03/05/2024 8:51 AM   "

## 2024-03-05 NOTE — Assessment & Plan Note (Signed)
 Treatment options reviewed.

## 2024-03-06 ENCOUNTER — Ambulatory Visit: Payer: Self-pay | Admitting: Family Medicine

## 2024-03-06 ENCOUNTER — Encounter: Payer: Self-pay | Admitting: Family Medicine

## 2024-03-06 LAB — CERVICOVAGINAL ANCILLARY ONLY
Bacterial Vaginitis (gardnerella): NEGATIVE
Candida Glabrata: NEGATIVE
Candida Vaginitis: NEGATIVE
Chlamydia: NEGATIVE
Comment: NEGATIVE
Comment: NEGATIVE
Comment: NEGATIVE
Comment: NEGATIVE
Comment: NEGATIVE
Comment: NORMAL
Neisseria Gonorrhea: NEGATIVE
Trichomonas: NEGATIVE

## 2024-03-12 ENCOUNTER — Ambulatory Visit (HOSPITAL_BASED_OUTPATIENT_CLINIC_OR_DEPARTMENT_OTHER): Admission: RE | Admit: 2024-03-12 | Source: Home / Self Care | Admitting: Plastic Surgery

## 2024-03-12 ENCOUNTER — Encounter (HOSPITAL_BASED_OUTPATIENT_CLINIC_OR_DEPARTMENT_OTHER): Admission: RE | Payer: Self-pay | Source: Home / Self Care

## 2024-03-12 DIAGNOSIS — I10 Essential (primary) hypertension: Secondary | ICD-10-CM

## 2024-03-12 DIAGNOSIS — Z01818 Encounter for other preprocedural examination: Secondary | ICD-10-CM

## 2024-03-12 SURGERY — BREAST REDUCTION WITH LIPOSUCTION
Anesthesia: Choice | Site: Breast | Laterality: Bilateral

## 2024-03-16 ENCOUNTER — Telehealth

## 2024-03-20 ENCOUNTER — Encounter: Admitting: Student

## 2024-04-03 ENCOUNTER — Encounter: Admitting: Plastic Surgery

## 2024-04-17 ENCOUNTER — Encounter: Admitting: Plastic Surgery

## 2024-08-12 ENCOUNTER — Ambulatory Visit: Admitting: Family Medicine

## 2024-11-19 ENCOUNTER — Encounter: Admitting: Family Medicine
